# Patient Record
Sex: Female | Born: 1980 | Race: White | Hispanic: No | Marital: Married | Smoking: Former smoker
Health system: Southern US, Community
[De-identification: ages and names within clinical notes are randomized; demographics above are authoritative.]

## PROBLEM LIST (undated history)

## (undated) DIAGNOSIS — T7840XA Allergy, unspecified, initial encounter: Secondary | ICD-10-CM

## (undated) DIAGNOSIS — F988 Other specified behavioral and emotional disorders with onset usually occurring in childhood and adolescence: Secondary | ICD-10-CM

## (undated) DIAGNOSIS — D649 Anemia, unspecified: Secondary | ICD-10-CM

## (undated) DIAGNOSIS — F909 Attention-deficit hyperactivity disorder, unspecified type: Secondary | ICD-10-CM

## (undated) DIAGNOSIS — A084 Viral intestinal infection, unspecified: Secondary | ICD-10-CM

## (undated) DIAGNOSIS — K5792 Diverticulitis of intestine, part unspecified, without perforation or abscess without bleeding: Secondary | ICD-10-CM

## (undated) DIAGNOSIS — D279 Benign neoplasm of unspecified ovary: Secondary | ICD-10-CM

## (undated) DIAGNOSIS — R87629 Unspecified abnormal cytological findings in specimens from vagina: Secondary | ICD-10-CM

## (undated) DIAGNOSIS — IMO0001 Reserved for inherently not codable concepts without codable children: Secondary | ICD-10-CM

## (undated) HISTORY — DX: Unspecified abnormal cytological findings in specimens from vagina: R87.629

## (undated) HISTORY — DX: Hypocalcemia: E83.51

## (undated) HISTORY — DX: Viral intestinal infection, unspecified: A08.4

## (undated) HISTORY — DX: Attention-deficit hyperactivity disorder, unspecified type: F90.9

## (undated) HISTORY — DX: Reserved for inherently not codable concepts without codable children: IMO0001

## (undated) HISTORY — DX: Benign neoplasm of unspecified ovary: D27.9

## (undated) HISTORY — DX: Anemia, unspecified: D64.9

## (undated) HISTORY — DX: Allergy, unspecified, initial encounter: T78.40XA

## (undated) HISTORY — DX: Diverticulitis of intestine, part unspecified, without perforation or abscess without bleeding: K57.92

## (undated) HISTORY — DX: Other specified behavioral and emotional disorders with onset usually occurring in childhood and adolescence: F98.8

---
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
## 2001-09-14 ENCOUNTER — Inpatient Hospital Stay (HOSPITAL_COMMUNITY): Admission: AD | Admit: 2001-09-14 | Discharge: 2001-09-14 | Payer: Self-pay | Admitting: Obstetrics and Gynecology

## 2003-09-29 ENCOUNTER — Inpatient Hospital Stay (HOSPITAL_COMMUNITY): Admission: AD | Admit: 2003-09-29 | Discharge: 2003-09-29 | Payer: Self-pay | Admitting: Obstetrics and Gynecology

## 2003-10-08 ENCOUNTER — Other Ambulatory Visit: Admission: RE | Admit: 2003-10-08 | Discharge: 2003-10-08 | Payer: Self-pay | Admitting: Obstetrics and Gynecology

## 2004-04-17 ENCOUNTER — Inpatient Hospital Stay (HOSPITAL_COMMUNITY): Admission: AD | Admit: 2004-04-17 | Discharge: 2004-04-17 | Payer: Self-pay | Admitting: Obstetrics and Gynecology

## 2004-04-22 ENCOUNTER — Inpatient Hospital Stay (HOSPITAL_COMMUNITY): Admission: AD | Admit: 2004-04-22 | Discharge: 2004-04-23 | Payer: Self-pay | Admitting: Obstetrics and Gynecology

## 2009-03-27 ENCOUNTER — Inpatient Hospital Stay (HOSPITAL_COMMUNITY): Admission: AD | Admit: 2009-03-27 | Discharge: 2009-03-29 | Payer: Self-pay | Admitting: Obstetrics and Gynecology

## 2009-12-15 ENCOUNTER — Ambulatory Visit: Payer: Self-pay | Admitting: Obstetrics & Gynecology

## 2010-01-12 ENCOUNTER — Ambulatory Visit: Payer: Self-pay | Admitting: Obstetrics & Gynecology

## 2010-06-09 ENCOUNTER — Encounter: Payer: Self-pay | Admitting: Family Medicine

## 2010-07-07 ENCOUNTER — Ambulatory Visit: Payer: Self-pay | Admitting: Family Medicine

## 2010-07-07 DIAGNOSIS — F438 Other reactions to severe stress: Secondary | ICD-10-CM

## 2010-07-07 DIAGNOSIS — G4700 Insomnia, unspecified: Secondary | ICD-10-CM

## 2010-07-11 ENCOUNTER — Encounter: Payer: Self-pay | Admitting: Family Medicine

## 2010-07-12 LAB — CONVERTED CEMR LAB: TSH: 0.521 microintl units/mL (ref 0.350–4.500)

## 2010-08-04 ENCOUNTER — Ambulatory Visit: Payer: Self-pay | Admitting: Family Medicine

## 2010-09-20 NOTE — Letter (Signed)
Summary: Depression & Anxiety Questionnaire  Depression & Anxiety Questionnaire   Imported By: Lanelle Bal 07/19/2010 13:16:44  _____________________________________________________________________  External Attachment:    Type:   Image     Comment:   External Document

## 2010-09-20 NOTE — Letter (Signed)
Summary: Health Screening Results  Health Screening Results   Imported By: Maryln Gottron 07/25/2010 14:30:10  _____________________________________________________________________  External Attachment:    Type:   Image     Comment:   External Document

## 2010-09-20 NOTE — Assessment & Plan Note (Signed)
Summary: NOV: Anxiety   Vital Signs:  Patient profile:   30 year old female Weight:      126 pounds Pulse rate:   77 / minute BP sitting:   116 / 77  (right arm) Cuff size:   regular  Vitals Entered By: Avon Gully CMA, Duncan Dull) (July 07, 2010 2:18 PM) CC: anxiety,panic attacks, sleep issues   CC:  anxiety, panic attacks, and sleep issues.  History of Present Illness: Feeling ressly stressed. Not lseeping well. Eats well. More moody adn irritability. Never had this before.  More lately feel feels more nervous. Noitces when out at a gatheine feels like her heart starts racing and wants to be alone.  Still 5-6 hours at night. Has been worrying about her kids. going into court battle wiht her husband with whom she is separated.  Says will wake up around 3AM and can't go back to sleep for 1-2 hours.  She is most interested in a medication for as needed use.   Habits & Providers  Alcohol-Tobacco-Diet     Alcohol drinks/day: 0     Tobacco Status: current     Cigarette Packs/Day: 2.0  Exercise-Depression-Behavior     Does Patient Exercise: no     STD Risk: never     Drug Use: no  Current Medications (verified): 1)  Mirena 20 Mcg/24hr Iud (Levonorgestrel)  Allergies (verified): No Known Drug Allergies  Comments:  Nurse/Medical Assistant: The patient's medications and allergies were reviewed with the patient and were updated in the Medication and Allergy Lists. 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  Married to [employer] wiht 4 kids.   Current Smoker Drug use-no Regular exercise-no Smoking Status:  current Packs/Day:  2.0 STD Risk:  never Drug Use:  no Does Patient Exercise:  no  Physical Exam  General:  Well-developed,well-nourished,in no  acute distress; alert,appropriate and cooperative throughout examination Psych:  Cognition and judgment appear intact. Alert and cooperative with normal attention span and concentration. No apparent delusions, illusions, hallucinations   Impression & Recommendations:  Problem # 1:  OTHER ACUTE REACTIONS TO STRESS (ICD-308.3)  Discussed options.  She is not interested in therapy at this time.  GAD-7 14, PHQ-9 -11 Discussed that she does have anxziety and depression that is acute and situational. I really feel a daily medication would be helpful for her. Discussed optoins. Start with SSRI. Warned about different SE. F/U in 3 weeks. Can use the xanax as needed. Discussed the addictive potential of this medication. Will check her TSH to rule this out.   Orders: T-TSH ([id number])  Problem # 2:  INSOMNIA (ICD-780.52) I do think this is related to her anxiety.  I do nto feel this is a separate issues. Can use half a tab of xanax and see if helps he rsleep but I dont' want her to use it nightly.  Orders: T-TSH ([id number])  Complete Medication List: 1)  Mirena 20 Mcg/24hr Iud (Levonorgestrel) 2)  Citalopram Hydrobromide 20 Mg Tabs (Citalopram hydrobromide) .... 1/2 by mouth daily for one week, then increase to whole tab daily. 3)  Alprazolam 1 Mg Tabs (Alprazolam) .... Take 1 tablet by mouth once a day as needed anxiety  Patient Instructions: 1)  Please schedule a follow-up appointment in 3-4 weeks.  Prescriptions: ALPRAZOLAM 1 MG TABS (  ALPRAZOLAM) Take 1 tablet by mouth once a day as needed anxiety  #30 x 0   Entered and Authorized by:   Nani Gasser MD   Signed by:   Nani Gasser MD on 07/07/2010   Method used:   Printed then faxed to ...       [facility]* (retail)       [address]       [place], [place]  [address]       Ph: [phone]       Fax: [phone]   RxID:   [id number] CITALOPRAM HYDROBROMIDE 20 MG TABS (CITALOPRAM  HYDROBROMIDE) 1/2 by mouth daily for one week, then increase to whole tab daily.  #30 x 0   Entered and Authorized by:   Nani Gasser MD   Signed by:   Nani Gasser MD on 07/07/2010   Method used:   Electronically to        [facility] (retail)       [address]       [place], [place]  [address]       Ph: [phone]       Fax: [phone]   RxID:   [id number]    Orders Added: 1)  New Patient Level III [[address]] 2)  T-TSH [phone]

## 2010-09-22 NOTE — Assessment & Plan Note (Signed)
Summary: 4WK F/UP mood   Vital Signs:  Patient profile:   30 year old female Weight:      125 pounds Pulse rate:   95 / minute BP sitting:   106 / 64  (right arm) Cuff size:   regular  Vitals Entered By: Avon Gully CMA, Duncan Dull) (August 04, 2010 11:26 AM) CC: f/u anxiety   CC:  f/u anxiety.  History of Present Illness: Tolerating it well. No SE. Says she has noticed overall not tearful. Says coping with things a little better. Still feels stressed and anxious.  Says using the xanax mostly at bedtime to help her fall asleep.    Current Medications (verified): 1)  Mirena 20 Mcg/24hr Iud (Levonorgestrel) 2)  Citalopram Hydrobromide 20 Mg Tabs (Citalopram Hydrobromide) .... 1/2 By Mouth Daily For One Week, Then Increase To Whole Tab Daily. 3)  Alprazolam 1 Mg Tabs (Alprazolam) .... Take 1 Tablet By Mouth Once A Day As Needed Anxiety  Allergies (verified): No Known Drug Allergies  Comments:  Nurse/Medical Assistant: The patient's medications and allergies were reviewed with the patient and were updated in the Medication and Allergy Lists. Avon Gully CMA, Duncan Dull) (August 04, 2010 11:26 AM)  Physical Exam  General:  Well-developed,well-nourished,in no acute distress; alert,appropriate and cooperative throughout examination Lungs:  Normal respiratory effort, chest expands symmetrically. Lungs are clear to auscultation, no crackles or wheezes. Heart:  Normal rate and regular rhythm. S1 and S2 normal without gallop, murmur, click, rub or other extra sounds.   Impression & Recommendations:  Problem # 1:  OTHER ACUTE REACTIONS TO STRESS (ICD-308.3) GAD-7 socre of 11 today. This is improved frm 14 and she does feel her depressive sxs are much better.  F/U in 6 weeks.   Problem # 2:  INSOMNIA (ICD-780.52) Improved but using the xanax most nights. Monitor for dependency.   Complete Medication List: 1)  Mirena 20 Mcg/24hr Iud (Levonorgestrel) 2)  Citalopram  Hydrobromide 40 Mg Tabs (Citalopram hydrobromide) .... Take 1 tablet by mouth once a day 3)  Alprazolam 1 Mg Tabs (Alprazolam) .... Take 1 tablet by mouth once a day as needed anxiety  Patient Instructions: 1)  Please schedule a follow-up appointment in 6  for mood. Prescriptions: CITALOPRAM HYDROBROMIDE 40 MG TABS (CITALOPRAM HYDROBROMIDE) Take 1 tablet by mouth once a day  #30 x 1   Entered and Authorized by:   Nani Gasser MD   Signed by:   Nani Gasser MD on 08/04/2010   Method used:   Electronically to        [facility] (retail)       [address]       [place], [place]  [address]       Ph: [phone]       Fax: [phone]   RxID:   [id number]    Orders Added: 1)  Est. Patient Level III [[address]]

## 2010-09-22 NOTE — Letter (Signed)
Summary: Anxiety Questionnaire  Anxiety Questionnaire   Imported By: Lanelle Bal 08/25/2010 12:24:48  _____________________________________________________________________  External Attachment:    Type:   Image     Comment:   External Document

## 2010-10-19 ENCOUNTER — Telehealth: Payer: Self-pay | Admitting: Family Medicine

## 2010-10-27 NOTE — Progress Notes (Signed)
  Phone Note Refill Request Message from:  Patient on October 19, 2010 1:14 PM  Refills Requested: Medication #1:  CITALOPRAM HYDROBROMIDE 40 MG TABS Take 1 tablet by mouth once a day  Medication #2:  ALPRAZOLAM 1 MG TABS Take 1 tablet by mouth once a day as needed anxiety. Initial call taken by: Avon Gully CMA, Duncan Dull),  October 19, 2010 1:15 PM    Prescriptions: ALPRAZOLAM 1 MG TABS (ALPRAZOLAM) Take 1 tablet by mouth once a day as needed anxiety  #30 x 0   Entered by:   Avon Gully CMA, (AAMA)   Authorized by:   Nani Gasser MD   Signed by:   Avon Gully CMA, (AAMA) on 10/19/2010   Method used:   Printed then faxed to ...       [facility] (retail)       [address]       [place], [place]  [address]       Ph: [phone]       Fax: [phone]   RxID:   [id number] CITALOPRAM HYDROBROMIDE 40 MG TABS (CITALOPRAM HYDROBROMIDE) Take 1 tablet by mouth once a day  #30 x 1   Entered by:   Avon Gully CMA, (AAMA)   Authorized by:   Nani Gasser MD   Signed by:   Avon Gully CMA, (AAMA) on 10/19/2010   Method used:   Printed then faxed to ...       [facility] (retail)       [address]       [place], [place]  [address]       Ph: [phone]       Fax: [phone]   RxID:   [id number]

## 2010-11-18 ENCOUNTER — Other Ambulatory Visit: Payer: Self-pay | Admitting: Family Medicine

## 2010-11-18 DIAGNOSIS — F419 Anxiety disorder, unspecified: Secondary | ICD-10-CM

## 2010-11-18 MED ORDER — CITALOPRAM HYDROBROMIDE 40 MG PO TABS: 40.0000 mg | ORAL_TABLET | Freq: Every day | ORAL | Status: DC

## 2010-11-18 MED ORDER — ALPRAZOLAM 1 MG PO TABS: 1.0000 mg | ORAL_TABLET | Freq: Every evening | ORAL | Status: DC | PRN

## 2010-11-26 LAB — CBC
HCT: 29.5 % — ABNORMAL LOW (ref 36.0–46.0)
Hemoglobin: 10.1 g/dL — ABNORMAL LOW (ref 12.0–15.0)
Hemoglobin: 11.2 g/dL — ABNORMAL LOW (ref 12.0–15.0)
MCV: 96.9 fL (ref 78.0–100.0)
RBC: 3.04 MIL/uL — ABNORMAL LOW (ref 3.87–5.11)
RBC: 3.41 MIL/uL — ABNORMAL LOW (ref 3.87–5.11)
RDW: 14.4 % (ref 11.5–15.5)
WBC: 13 10*3/uL — ABNORMAL HIGH (ref 4.0–10.5)
WBC: 13.1 10*3/uL — ABNORMAL HIGH (ref 4.0–10.5)

## 2010-12-02 ENCOUNTER — Encounter: Payer: Self-pay | Admitting: Family Medicine

## 2010-12-02 ENCOUNTER — Ambulatory Visit (INDEPENDENT_AMBULATORY_CARE_PROVIDER_SITE_OTHER): Payer: 59 | Admitting: Family Medicine

## 2010-12-02 DIAGNOSIS — F438 Other reactions to severe stress: Secondary | ICD-10-CM

## 2010-12-02 DIAGNOSIS — R05 Cough: Secondary | ICD-10-CM

## 2010-12-02 MED ORDER — ALBUTEROL SULFATE HFA 108 (90 BASE) MCG/ACT IN AERS: 2.0000 | INHALATION_SPRAY | Freq: Four times a day (QID) | RESPIRATORY_TRACT | Status: DC | PRN

## 2010-12-02 MED ORDER — AZITHROMYCIN 250 MG PO TABS: ORAL_TABLET | ORAL | Status: AC

## 2010-12-02 NOTE — Assessment & Plan Note (Signed)
GAD-7 score of 8. Pt says she is very happy with her current regimen and is satisfied with her results and doesn't want to make any changes.

## 2010-12-02 NOTE — Progress Notes (Signed)
  Subjective:    Patient ID: Lauren Franklin, female    DOB: 15-May-1981, 30 y.o.   MRN: [id number]  HPI Cough for several weeks had a sinus infection. No sinus congestion or nasal pressure. Cough is occ dry and occ phelgm. SOB is with the coughing spell.  Seems to be worse at night.  Felt very SOB the other day. Used a friends rescue inhaler and that seemed to really help.  No hx of asthma. cough is waking her up in her sleep.  If smokes it actually gets better. Seh had recently stopped smoking.  If rans or gets hot will start coughing.  No fevers.  Some chest discomfort with cough.     Review of Systems     Objective:   Physical Exam  Constitutional: She appears well-developed and well-nourished.  HENT:  Head: Normocephalic and atraumatic.  Right Ear: External ear normal.  Left Ear: External ear normal.  Nose: Nose normal.  Mouth/Throat: Oropharynx is clear and moist.  Eyes: Conjunctivae and EOM are normal. Pupils are equal, round, and reactive to light.  Neck: No thyromegaly present.  Cardiovascular: Normal rate, regular rhythm and normal heart sounds.   Pulmonary/Chest: Effort normal and breath sounds normal.  Lymphadenopathy:    She has no cervical adenopathy.          Assessment & Plan:  Cough - She has had the cough for over a months and started with sinus sxs. Will go ahead and try a round of antibiotic. Will also call in an abuterol in haler since this really seemed to help, thought no dx of asthma. If not better in 10 days please call the office. Also recommend she start an antihistamine. Consider it may be allergic bronchitis as well. She says this has happened before in the spring.

## 2010-12-08 ENCOUNTER — Ambulatory Visit: Payer: Self-pay | Admitting: Family Medicine

## 2010-12-22 ENCOUNTER — Other Ambulatory Visit: Payer: Self-pay | Admitting: *Deleted

## 2010-12-22 DIAGNOSIS — F419 Anxiety disorder, unspecified: Secondary | ICD-10-CM

## 2010-12-22 MED ORDER — CITALOPRAM HYDROBROMIDE 40 MG PO TABS: 40.0000 mg | ORAL_TABLET | Freq: Every day | ORAL | Status: DC

## 2010-12-22 MED ORDER — ALPRAZOLAM 1 MG PO TABS: 1.0000 mg | ORAL_TABLET | Freq: Every evening | ORAL | Status: DC | PRN

## 2011-01-06 NOTE — H&P (Signed)
NAME:  Lauren Franklin, Lauren Franklin                         ACCOUNT NO.:  [id number]   MEDICAL RECORD NO.:  [id number]                   PATIENT TYPE:  INP   LOCATION:  9127                                 FACILITY:  [facility]   PHYSICIAN:  Hal Morales, M.D.             DATE OF BIRTH:  [birth year]   DATE OF ADMISSION:  04/22/2004  DATE OF DISCHARGE:                                HISTORY & PHYSICAL   HISTORY OF PRESENT ILLNESS:  Ms. Lauren Franklin is a 30 year old married white female,  gravida 3, para 1-0-1-1, who presents at 38-2/7 weeks complaining of leaking  large amounts of greenish-brownish-tinged fluid at 1 o'clock this morning  and the subsequent onset of uterine contractions.  She denies any nausea,  vomiting, headaches or visual disturbances.  She reports positive fetal  movement.  She would like some pain medication, but declines an epidural.  Her pregnancy has been followed at [facility] by the certified  nurse midwife service and has been essentially uncomplicated, though she is  positive for group B strep.   OBSTETRICAL/GYNECOLOGICAL HISTORY:  She is a gravida 3, para 1-0-1-1, who  delivered a viable female infant in January of 1999 who weighed 7 pounds 0  ounces at 39-6/7 weeks following a 4-hour labor.  She was attended in  delivery by Maggie Schwalbe, C.N.M. and that baby's name is Durene Cal.  In  February of 2003, she had a miscarriage, and then this current pregnancy.  She has no other GYN issues other than her LMP was July 29, 2003, giving  her an [facility] of May 04, 2004, confirmed by early ultrasound.   GENERAL MEDICAL HISTORY:  She reports having had the childhood diseases.  She has no medical issues other than a fractured right arm and collar bone,  and dislocated vertebra as a result of an accident.   ALLERGIES:  She has no known drug allergies.   PAST SURGICAL HISTORY:  Her only surgery was a laparotomy/cholecystectomy in  1999.   FAMILY HISTORY:  Her family  history is significant for maternal grandfather  with MI, maternal grandfather with chronic hypertension, maternal  grandmother with emphysema, maternal grandfather again with insulin-  dependent diabetes and CVA, maternal grandmother with stomach cancer.   GENETIC HISTORY:  Her genetic history is negative.   SOCIAL HISTORY:  She is married to [employer], who is involved and  supportive.  They are of the [facility] faith.  She is employed full-time as a  Geographical information systems officer and he is employed full-time in [employer], site.  They deny any  illicit drug use, alcohol or smoking with this pregnancy.   PRENATAL LABORATORIES:  Her blood type is A-positive, her antibody screen is  negative.  Syphilis is nonreactive.  Rubella is equivocal.  Hepatitis B  surface antigen is negative.  HIV is nonreactive.  Cystic fibrosis is  negative.  Her 36-week Beta strep was positive.   PHYSICAL EXAM:  VITAL SIGNS:  Her vital signs are stable.  She is afebrile.  HEENT:  Her HEENT is grossly within normal limits.  HEART:  Her heart is regular rhythm and rate.  CHEST:  Her chest is clear.  BREASTS:  Breasts are soft and nontender.  ABDOMEN:  Her abdomen is gravid with uterine contractions every 2 minutes.  Her fetal heart rate is reassuring.  PELVIC:  Her pelvic exam is 9 cm, 100%, vertex at a 0 station with moderate  meconium-stained fluid noted.  EXTREMITIES:  Her extremities are within normal limits.   ASSESSMENT:  1.  Intrauterine pregnancy at term.  2.  Active labor.  3.  Spontaneous rupture of the membranes with meconium-stained fluid.  4.  Positive group B streptococcus.   PLAN:  Her plan is to admit to [facility], to follow routine C.N.M.  orders, to give her ampicillin for group B strep and Dr. Hal Morales  is notified of the patient's admission.     Concha Pyo. Duplantis, C.N.M.              Hal Morales, M.D.    SJD/MEDQ  D:  04/22/2004  T:  04/22/2004  Job:  [id number]

## 2011-01-18 ENCOUNTER — Encounter: Payer: Self-pay | Admitting: Family Medicine

## 2011-01-18 ENCOUNTER — Ambulatory Visit (INDEPENDENT_AMBULATORY_CARE_PROVIDER_SITE_OTHER): Payer: 59 | Admitting: Family Medicine

## 2011-01-18 DIAGNOSIS — J069 Acute upper respiratory infection, unspecified: Secondary | ICD-10-CM

## 2011-01-18 MED ORDER — AZITHROMYCIN 250 MG PO TABS: ORAL_TABLET | ORAL | Status: AC

## 2011-01-18 NOTE — Patient Instructions (Signed)
Call if not getting better in the next 10 days.

## 2011-01-18 NOTE — Progress Notes (Signed)
  Subjective:    Patient ID: Lauren Franklin, female    DOB: 10-08-1980, 30 y.o.   MRN: [id number]  HPI Started with a blocked ear.  Some discomfort.  Daughter is sick. 2 days of nasal congestion, HA, sinus congestion.  She is going out of town this week.  No fever.  Cough is productive, green sputum.  No GI sxs.  Using nasal antihistamine and decongestant.    Review of Systems     Objective:   Physical Exam  Constitutional: She is oriented to person, place, and time. She appears well-developed and well-nourished.  HENT:  Head: Normocephalic and atraumatic.  Right Ear: External ear normal.  Left Ear: External ear normal.  Nose: Nose normal.  Mouth/Throat: Oropharynx is clear and moist.       TMs and canals are clear bilat.  Eyes: Conjunctivae are normal. Pupils are equal, round, and reactive to light.  Neck: Neck supple. No thyromegaly present.  Cardiovascular: Normal rate, regular rhythm and normal heart sounds.   Pulmonary/Chest: Effort normal and breath sounds normal.  Lymphadenopathy:    She has no cervical adenopathy.  Neurological: She is alert and oriented to person, place, and time.  Skin: Skin is warm and dry.  Psychiatric: She has a normal mood and affect.          Assessment & Plan:  URI- Since going out of town can fill the antibiotic if not getting better.  Call if not better in 10 days.

## 2011-01-20 ENCOUNTER — Other Ambulatory Visit: Payer: Self-pay | Admitting: *Deleted

## 2011-01-20 DIAGNOSIS — F419 Anxiety disorder, unspecified: Secondary | ICD-10-CM

## 2011-01-20 MED ORDER — ALPRAZOLAM 1 MG PO TABS: 1.0000 mg | ORAL_TABLET | Freq: Every evening | ORAL | Status: DC | PRN

## 2011-01-23 ENCOUNTER — Other Ambulatory Visit: Payer: Self-pay | Admitting: *Deleted

## 2011-01-23 MED ORDER — BENZONATATE 200 MG PO CAPS: 200.0000 mg | ORAL_CAPSULE | Freq: Three times a day (TID) | ORAL | Status: AC | PRN

## 2011-01-25 ENCOUNTER — Telehealth: Payer: Self-pay | Admitting: Family Medicine

## 2011-01-25 MED ORDER — HYDROCODONE-HOMATROPINE 5-1.5 MG/5ML PO SYRP: 5.0000 mL | ORAL_SOLUTION | Freq: Every evening | ORAL | Status: AC | PRN

## 2011-01-25 NOTE — Telephone Encounter (Signed)
Pt still with sever cough that is keeping her awake at night. Will call in cough med. Will send in nightime cough med. Has tessalone perles for daytime.

## 2011-02-27 ENCOUNTER — Other Ambulatory Visit: Payer: Self-pay | Admitting: Family Medicine

## 2011-02-27 DIAGNOSIS — F419 Anxiety disorder, unspecified: Secondary | ICD-10-CM

## 2011-02-27 MED ORDER — ALPRAZOLAM 1 MG PO TABS: 1.0000 mg | ORAL_TABLET | Freq: Every evening | ORAL | Status: DC | PRN

## 2011-03-30 ENCOUNTER — Other Ambulatory Visit: Payer: Self-pay | Admitting: *Deleted

## 2011-03-30 DIAGNOSIS — F419 Anxiety disorder, unspecified: Secondary | ICD-10-CM

## 2011-03-30 MED ORDER — CITALOPRAM HYDROBROMIDE 40 MG PO TABS: 40.0000 mg | ORAL_TABLET | Freq: Every day | ORAL | Status: DC

## 2011-03-30 MED ORDER — ALPRAZOLAM 1 MG PO TABS: 1.0000 mg | ORAL_TABLET | Freq: Every evening | ORAL | Status: DC | PRN

## 2011-04-21 ENCOUNTER — Other Ambulatory Visit: Payer: Self-pay | Admitting: *Deleted

## 2011-04-21 DIAGNOSIS — F419 Anxiety disorder, unspecified: Secondary | ICD-10-CM

## 2011-04-21 MED ORDER — CITALOPRAM HYDROBROMIDE 40 MG PO TABS: 40.0000 mg | ORAL_TABLET | Freq: Every day | ORAL | Status: DC

## 2011-04-21 MED ORDER — ALPRAZOLAM 1 MG PO TABS: 1.0000 mg | ORAL_TABLET | Freq: Every evening | ORAL | Status: DC | PRN

## 2011-05-29 ENCOUNTER — Other Ambulatory Visit: Payer: Self-pay | Admitting: *Deleted

## 2011-05-29 DIAGNOSIS — F419 Anxiety disorder, unspecified: Secondary | ICD-10-CM

## 2011-05-29 MED ORDER — ALPRAZOLAM 1 MG PO TABS: 1.0000 mg | ORAL_TABLET | Freq: Every evening | ORAL | Status: DC | PRN

## 2011-05-29 MED ORDER — CITALOPRAM HYDROBROMIDE 40 MG PO TABS: 40.0000 mg | ORAL_TABLET | Freq: Every day | ORAL | Status: DC

## 2011-06-23 ENCOUNTER — Other Ambulatory Visit: Payer: Self-pay | Admitting: *Deleted

## 2011-06-23 DIAGNOSIS — F419 Anxiety disorder, unspecified: Secondary | ICD-10-CM

## 2011-06-23 MED ORDER — ALPRAZOLAM 1 MG PO TABS: 1.0000 mg | ORAL_TABLET | Freq: Every evening | ORAL | Status: DC | PRN

## 2011-07-03 ENCOUNTER — Other Ambulatory Visit: Payer: Self-pay | Admitting: *Deleted

## 2011-07-03 MED ORDER — CITALOPRAM HYDROBROMIDE 40 MG PO TABS: 40.0000 mg | ORAL_TABLET | Freq: Every day | ORAL | Status: DC

## 2011-07-24 ENCOUNTER — Other Ambulatory Visit: Payer: Self-pay | Admitting: *Deleted

## 2011-07-24 DIAGNOSIS — F419 Anxiety disorder, unspecified: Secondary | ICD-10-CM

## 2011-07-24 MED ORDER — ALPRAZOLAM 1 MG PO TABS: 1.0000 mg | ORAL_TABLET | Freq: Every evening | ORAL | Status: DC | PRN

## 2011-08-04 ENCOUNTER — Other Ambulatory Visit: Payer: Self-pay | Admitting: *Deleted

## 2011-08-04 DIAGNOSIS — F419 Anxiety disorder, unspecified: Secondary | ICD-10-CM

## 2011-08-04 MED ORDER — ALPRAZOLAM 1 MG PO TABS: 1.0000 mg | ORAL_TABLET | Freq: Every evening | ORAL | Status: DC | PRN

## 2011-08-04 NOTE — Telephone Encounter (Signed)
Pharm did not get refill.called rx into pharm

## 2011-08-11 ENCOUNTER — Other Ambulatory Visit: Payer: Self-pay | Admitting: *Deleted

## 2011-08-11 MED ORDER — CITALOPRAM HYDROBROMIDE 40 MG PO TABS: 40.0000 mg | ORAL_TABLET | Freq: Every day | ORAL | Status: DC

## 2011-08-18 ENCOUNTER — Ambulatory Visit: Payer: 59 | Admitting: Physician Assistant

## 2011-08-24 ENCOUNTER — Ambulatory Visit: Payer: 59 | Admitting: Physician Assistant

## 2011-08-30 ENCOUNTER — Ambulatory Visit (INDEPENDENT_AMBULATORY_CARE_PROVIDER_SITE_OTHER): Payer: 59 | Admitting: Family Medicine

## 2011-08-30 VITALS — Temp 98.3°F | Ht 64.0 in | Wt 127.0 lb

## 2011-08-30 DIAGNOSIS — R300 Dysuria: Secondary | ICD-10-CM

## 2011-08-30 DIAGNOSIS — Z0289 Encounter for other administrative examinations: Secondary | ICD-10-CM

## 2011-08-30 DIAGNOSIS — N390 Urinary tract infection, site not specified: Secondary | ICD-10-CM

## 2011-08-30 LAB — POCT URINALYSIS DIPSTICK
Glucose, UA: NEGATIVE
Spec Grav, UA: 1.015
Urobilinogen, UA: 0.2

## 2011-08-30 MED ORDER — SULFAMETHOXAZOLE-TRIMETHOPRIM 800-160 MG PO TABS: 1.0000 | ORAL_TABLET | Freq: Two times a day (BID) | ORAL | Status: DC

## 2011-08-30 NOTE — Progress Notes (Signed)
Patient ID: Lauren Franklin, female   DOB: February 27, 1981, 30 y.o.   MRN: [id number] C/o cloudy, darker urine and burning w/ urination x 3 days. Worse at night.   UTI-her urinalysis is positive for leukocytes and blood. I will place her on Bactrim x3 days. Call if symptoms are not resolving or suddenly gets worse.

## 2011-08-30 NOTE — Progress Notes (Signed)
Addended by: Ellsworth Lennox on: 08/30/2011 03:10 PM   Modules accepted: Orders

## 2011-08-31 ENCOUNTER — Other Ambulatory Visit: Payer: Self-pay | Admitting: *Deleted

## 2011-08-31 MED ORDER — SULFAMETHOXAZOLE-TRIMETHOPRIM 800-160 MG PO TABS: 1.0000 | ORAL_TABLET | Freq: Two times a day (BID) | ORAL | Status: AC

## 2011-09-18 ENCOUNTER — Other Ambulatory Visit: Payer: Self-pay | Admitting: *Deleted

## 2011-09-18 DIAGNOSIS — F419 Anxiety disorder, unspecified: Secondary | ICD-10-CM

## 2011-09-18 MED ORDER — ALPRAZOLAM 1 MG PO TABS: 1.0000 mg | ORAL_TABLET | Freq: Every evening | ORAL | Status: DC | PRN

## 2011-09-18 MED ORDER — CITALOPRAM HYDROBROMIDE 40 MG PO TABS: 40.0000 mg | ORAL_TABLET | Freq: Every day | ORAL | Status: DC

## 2011-09-21 ENCOUNTER — Encounter: Payer: Self-pay | Admitting: *Deleted

## 2011-09-21 ENCOUNTER — Other Ambulatory Visit: Payer: Self-pay | Admitting: *Deleted

## 2011-09-21 DIAGNOSIS — F419 Anxiety disorder, unspecified: Secondary | ICD-10-CM

## 2011-09-21 MED ORDER — ALPRAZOLAM 1 MG PO TABS: 1.0000 mg | ORAL_TABLET | Freq: Every evening | ORAL | Status: DC | PRN

## 2011-10-11 ENCOUNTER — Ambulatory Visit (INDEPENDENT_AMBULATORY_CARE_PROVIDER_SITE_OTHER): Payer: 59 | Admitting: Family Medicine

## 2011-10-11 ENCOUNTER — Encounter: Payer: Self-pay | Admitting: Family Medicine

## 2011-10-11 VITALS — BP 129/77 | HR 90 | Wt 130.0 lb

## 2011-10-11 DIAGNOSIS — L708 Other acne: Secondary | ICD-10-CM

## 2011-10-11 DIAGNOSIS — L709 Acne, unspecified: Secondary | ICD-10-CM

## 2011-10-11 DIAGNOSIS — F988 Other specified behavioral and emotional disorders with onset usually occurring in childhood and adolescence: Secondary | ICD-10-CM

## 2011-10-11 DIAGNOSIS — N390 Urinary tract infection, site not specified: Secondary | ICD-10-CM

## 2011-10-11 LAB — POCT URINALYSIS DIPSTICK
Ketones, UA: NEGATIVE
Protein, UA: NEGATIVE
Spec Grav, UA: 1.015
Urobilinogen, UA: 0.2

## 2011-10-11 MED ORDER — BENZOYL PEROXIDE 5 % EX LIQD: Freq: Two times a day (BID) | CUTANEOUS | Status: AC

## 2011-10-11 MED ORDER — CLINDAMYCIN PHOSPHATE 1 % EX GEL: CUTANEOUS | Status: DC

## 2011-10-11 MED ORDER — AMPHETAMINE-DEXTROAMPHET ER 15 MG PO CP24: 15.0000 mg | ORAL_CAPSULE | ORAL | Status: DC

## 2011-10-11 MED ORDER — CIPROFLOXACIN HCL 500 MG PO TABS: 500.0000 mg | ORAL_TABLET | Freq: Two times a day (BID) | ORAL | Status: AC

## 2011-10-11 NOTE — Progress Notes (Signed)
  Subjective:    Patient ID: Lauren Franklin, female    DOB: [birth year], 30 y.o.   MRN: [id number]  HPI UTI -  Dysuria x 3.  + hematuria. No fever or back pain.  Tx with bactrim a few weeks ago for similar sxs. No douches.    Acne- Moslty on her face.  Worse on chin area.  Does pick as well.  Some cystic. No lesions on back or chest. Using neutrogenia wash.   ADD - Feels like can 't stay focused.  Says has always been that way. Feels has been worse lately.  She is single parent now.  She is easily side tracked. Sometimes cut people off in conversation.  She says she has to write everything down or she will forget to do it. She says she wonders if she could have done better in school if she been able to focus better. Will forget what she is doing.  Catches herself shaking her foot.  Can't sit still. Feels like she has to keep moving. Brother with ADHD. Was on meds since he was little. No hx of heart problems.  She'll have palpitations with her anxiety.    Review of Systems     Objective:   Physical Exam  Constitutional: She is oriented to person, place, and time. She appears well-developed and well-nourished.  HENT:  Head: Normocephalic and atraumatic.  Neurological: She is alert and oriented to person, place, and time.  Skin: Skin is warm and dry.       Fine pustular acne on the facial cheeks and chin.  More lesion on the chin with some scabs.    Psychiatric: She has a normal mood and affect. Her behavior is normal.          Assessment & Plan:  UTI - UA is +. Will tx with Cipro since recently on Bactrim. Encourage hydration and emptying bladder frequently.  H.O givne.   Acne - discussed avoiding touching and picking at the face. We will start with the benzoyl peroxide wash and clindamycin gel. Followup in 4-6 weeks. If not improving significantly consider a topical Retin-A cream.  ADD - Her depression is well controlled.  Adult ADHD-RS-IV score is 28.  Only 6 points for  hyperactivity questions.  Suspect predmonianly ADD. No hx of heart problems.  Only hx of palpitations with anxiety. Mood is well controlled right now. Discussed options of further testing vs trial of medication. Warned about potential SE of sitmulatnt. Monitor 4 decreased appetite, weight loss, insomnia, chest pain or shortness of breath or palpitations. Call immediately if she has any of these symptoms. Followup in one month. Adderral XR 25mg  daily.

## 2011-10-11 NOTE — Patient Instructions (Signed)

## 2011-10-20 ENCOUNTER — Ambulatory Visit: Payer: 59 | Admitting: Family Medicine

## 2011-10-24 ENCOUNTER — Ambulatory Visit: Payer: 59 | Admitting: Family Medicine

## 2011-10-30 ENCOUNTER — Other Ambulatory Visit: Payer: Self-pay | Admitting: *Deleted

## 2011-10-30 DIAGNOSIS — F419 Anxiety disorder, unspecified: Secondary | ICD-10-CM

## 2011-10-30 MED ORDER — CITALOPRAM HYDROBROMIDE 40 MG PO TABS: 40.0000 mg | ORAL_TABLET | Freq: Every day | ORAL | Status: DC

## 2011-10-30 MED ORDER — ALPRAZOLAM 1 MG PO TABS: 1.0000 mg | ORAL_TABLET | Freq: Every evening | ORAL | Status: DC | PRN

## 2011-11-03 ENCOUNTER — Other Ambulatory Visit: Payer: Self-pay | Admitting: *Deleted

## 2011-11-03 MED ORDER — AMPHETAMINE-DEXTROAMPHET ER 15 MG PO CP24: 15.0000 mg | ORAL_CAPSULE | ORAL | Status: DC

## 2011-11-29 ENCOUNTER — Other Ambulatory Visit: Payer: Self-pay | Admitting: *Deleted

## 2011-11-29 DIAGNOSIS — F419 Anxiety disorder, unspecified: Secondary | ICD-10-CM

## 2011-11-29 MED ORDER — ALPRAZOLAM 1 MG PO TABS: 1.0000 mg | ORAL_TABLET | Freq: Every evening | ORAL | Status: DC | PRN

## 2011-12-19 ENCOUNTER — Other Ambulatory Visit: Payer: Self-pay | Admitting: *Deleted

## 2011-12-19 MED ORDER — AMPHETAMINE-DEXTROAMPHET ER 15 MG PO CP24: 15.0000 mg | ORAL_CAPSULE | ORAL | Status: DC

## 2011-12-19 MED ORDER — CITALOPRAM HYDROBROMIDE 40 MG PO TABS: 40.0000 mg | ORAL_TABLET | Freq: Every day | ORAL | Status: DC

## 2011-12-28 ENCOUNTER — Other Ambulatory Visit: Payer: Self-pay | Admitting: *Deleted

## 2011-12-28 DIAGNOSIS — F419 Anxiety disorder, unspecified: Secondary | ICD-10-CM

## 2011-12-28 MED ORDER — ALPRAZOLAM 1 MG PO TABS: 1.0000 mg | ORAL_TABLET | Freq: Every evening | ORAL | Status: DC | PRN

## 2012-01-10 ENCOUNTER — Ambulatory Visit (INDEPENDENT_AMBULATORY_CARE_PROVIDER_SITE_OTHER): Payer: 59 | Admitting: Family Medicine

## 2012-01-10 ENCOUNTER — Encounter: Payer: Self-pay | Admitting: Family Medicine

## 2012-01-10 VITALS — BP 119/79 | HR 97 | Ht 64.0 in | Wt 122.0 lb

## 2012-01-10 DIAGNOSIS — F988 Other specified behavioral and emotional disorders with onset usually occurring in childhood and adolescence: Secondary | ICD-10-CM

## 2012-01-10 MED ORDER — AMPHETAMINE-DEXTROAMPHET ER 30 MG PO CP24: 30.0000 mg | ORAL_CAPSULE | ORAL | Status: DC

## 2012-01-10 NOTE — Progress Notes (Signed)
  Subjective:    Patient ID: Lauren Franklin, female    DOB: 1981-05-02, 31 y.o.   MRN: [id number]  HPI ADD - No CP or SOB.  HAs been taking 15mg  and taking 2 and feels it works better.  Says has been much more productive at work.  Skips weekend.  Stil sleeping well at night.  Not skipping meals.  She has been working on losing weight. She wasn't to get to 120.    Review of Systems     Objective:   Physical Exam  Constitutional: She is oriented to person, place, and time. She appears well-developed and well-nourished.  HENT:  Head: Normocephalic and atraumatic.  Cardiovascular: Normal rate, regular rhythm and normal heart sounds.   Pulmonary/Chest: Effort normal and breath sounds normal.  Neurological: She is alert and oriented to person, place, and time.  Skin: Skin is warm and dry.  Psychiatric: She has a normal mood and affect. Her behavior is normal.          Assessment & Plan:  ADD- doing well. Will inc dose to 30mg . F/U in 4 months. BP well controlled. Weight will need to be monitored.

## 2012-01-24 ENCOUNTER — Other Ambulatory Visit: Payer: Self-pay | Admitting: *Deleted

## 2012-01-24 DIAGNOSIS — F419 Anxiety disorder, unspecified: Secondary | ICD-10-CM

## 2012-01-24 MED ORDER — CITALOPRAM HYDROBROMIDE 40 MG PO TABS: 40.0000 mg | ORAL_TABLET | Freq: Every day | ORAL | Status: DC

## 2012-01-24 MED ORDER — ALPRAZOLAM 1 MG PO TABS: 1.0000 mg | ORAL_TABLET | Freq: Every evening | ORAL | Status: DC | PRN

## 2012-02-08 ENCOUNTER — Other Ambulatory Visit: Payer: Self-pay | Admitting: *Deleted

## 2012-02-08 MED ORDER — AMPHETAMINE-DEXTROAMPHET ER 30 MG PO CP24: 30.0000 mg | ORAL_CAPSULE | ORAL | Status: DC

## 2012-02-20 ENCOUNTER — Ambulatory Visit (INDEPENDENT_AMBULATORY_CARE_PROVIDER_SITE_OTHER): Payer: 59 | Admitting: Family Medicine

## 2012-02-20 DIAGNOSIS — R300 Dysuria: Secondary | ICD-10-CM

## 2012-02-20 DIAGNOSIS — N390 Urinary tract infection, site not specified: Secondary | ICD-10-CM

## 2012-02-20 DIAGNOSIS — R309 Painful micturition, unspecified: Secondary | ICD-10-CM

## 2012-02-20 LAB — POCT URINALYSIS DIPSTICK
Bilirubin, UA: NEGATIVE
Glucose, UA: NEGATIVE
Ketones, UA: NEGATIVE
Spec Grav, UA: 1.015
Urobilinogen, UA: 0.2

## 2012-02-20 MED ORDER — SULFAMETHOXAZOLE-TRIMETHOPRIM 800-160 MG PO TABS: 1.0000 | ORAL_TABLET | Freq: Two times a day (BID) | ORAL | Status: AC

## 2012-02-20 NOTE — Progress Notes (Signed)
  Subjective:    Patient ID: Lauren Franklin, female    DOB: 1981-01-26, 31 y.o.   MRN: [id number] Painful Urination x 1 month. This is her 4th UTI.  No fever or back pain.   HPI    Review of Systems     Objective:   Physical Exam        Assessment & Plan:  Dysuria-dip stick was positive for blood and leukocytes. We'll go ahead and treat for urinary tract infection. Bactrim sent to pharmacy. Call if not better in 3-5 days. Make sure drinking plenty of fluids.Will send culture if UTI is not clear.  Nani Gasser M.D.

## 2012-02-29 ENCOUNTER — Other Ambulatory Visit: Payer: Self-pay | Admitting: *Deleted

## 2012-02-29 DIAGNOSIS — F419 Anxiety disorder, unspecified: Secondary | ICD-10-CM

## 2012-02-29 MED ORDER — ALPRAZOLAM 1 MG PO TABS: 1.0000 mg | ORAL_TABLET | Freq: Every evening | ORAL | Status: DC | PRN

## 2012-03-04 ENCOUNTER — Other Ambulatory Visit: Payer: Self-pay | Admitting: *Deleted

## 2012-03-04 MED ORDER — CITALOPRAM HYDROBROMIDE 40 MG PO TABS: 40.0000 mg | ORAL_TABLET | Freq: Every day | ORAL | Status: DC

## 2012-03-08 ENCOUNTER — Other Ambulatory Visit: Payer: Self-pay | Admitting: *Deleted

## 2012-03-08 MED ORDER — AMPHETAMINE-DEXTROAMPHET ER 30 MG PO CP24: 30.0000 mg | ORAL_CAPSULE | ORAL | Status: DC

## 2012-04-05 ENCOUNTER — Other Ambulatory Visit: Payer: Self-pay | Admitting: *Deleted

## 2012-04-05 DIAGNOSIS — F419 Anxiety disorder, unspecified: Secondary | ICD-10-CM

## 2012-04-05 MED ORDER — ALPRAZOLAM 1 MG PO TABS: 1.0000 mg | ORAL_TABLET | Freq: Every evening | ORAL | Status: DC | PRN

## 2012-04-08 ENCOUNTER — Other Ambulatory Visit: Payer: Self-pay | Admitting: *Deleted

## 2012-04-08 MED ORDER — AMPHETAMINE-DEXTROAMPHET ER 30 MG PO CP24: 30.0000 mg | ORAL_CAPSULE | ORAL | Status: DC

## 2012-04-08 MED ORDER — CITALOPRAM HYDROBROMIDE 40 MG PO TABS: 40.0000 mg | ORAL_TABLET | Freq: Every day | ORAL | Status: DC

## 2012-04-17 ENCOUNTER — Other Ambulatory Visit: Payer: Self-pay | Admitting: Family Medicine

## 2012-04-17 DIAGNOSIS — Z1231 Encounter for screening mammogram for malignant neoplasm of breast: Secondary | ICD-10-CM

## 2012-05-06 ENCOUNTER — Other Ambulatory Visit: Payer: Self-pay | Admitting: *Deleted

## 2012-05-06 DIAGNOSIS — F419 Anxiety disorder, unspecified: Secondary | ICD-10-CM

## 2012-05-06 MED ORDER — ALPRAZOLAM 1 MG PO TABS: 1.0000 mg | ORAL_TABLET | Freq: Every evening | ORAL | Status: DC | PRN

## 2012-05-07 ENCOUNTER — Other Ambulatory Visit: Payer: Self-pay | Admitting: *Deleted

## 2012-05-07 MED ORDER — AMPHETAMINE-DEXTROAMPHET ER 30 MG PO CP24: 30.0000 mg | ORAL_CAPSULE | ORAL | Status: DC

## 2012-05-17 ENCOUNTER — Other Ambulatory Visit: Payer: Self-pay | Admitting: *Deleted

## 2012-05-17 MED ORDER — CITALOPRAM HYDROBROMIDE 40 MG PO TABS: 40.0000 mg | ORAL_TABLET | Freq: Every day | ORAL | Status: DC

## 2012-06-04 ENCOUNTER — Other Ambulatory Visit: Payer: Self-pay | Admitting: *Deleted

## 2012-06-04 DIAGNOSIS — F419 Anxiety disorder, unspecified: Secondary | ICD-10-CM

## 2012-06-04 MED ORDER — ALPRAZOLAM 1 MG PO TABS: 1.0000 mg | ORAL_TABLET | Freq: Every evening | ORAL | Status: DC | PRN

## 2012-06-05 ENCOUNTER — Other Ambulatory Visit: Payer: Self-pay | Admitting: *Deleted

## 2012-06-05 MED ORDER — AMPHETAMINE-DEXTROAMPHET ER 30 MG PO CP24: 30.0000 mg | ORAL_CAPSULE | ORAL | Status: DC

## 2012-06-11 ENCOUNTER — Ambulatory Visit (INDEPENDENT_AMBULATORY_CARE_PROVIDER_SITE_OTHER): Payer: 59 | Admitting: Family Medicine

## 2012-06-11 DIAGNOSIS — R300 Dysuria: Secondary | ICD-10-CM

## 2012-06-11 DIAGNOSIS — N390 Urinary tract infection, site not specified: Secondary | ICD-10-CM

## 2012-06-11 LAB — POCT URINALYSIS DIPSTICK
Protein, UA: 30
Spec Grav, UA: 1.02
pH, UA: 7

## 2012-06-11 MED ORDER — CIPROFLOXACIN HCL 500 MG PO TABS: 500.0000 mg | ORAL_TABLET | Freq: Two times a day (BID) | ORAL | Status: DC

## 2012-06-11 NOTE — Progress Notes (Signed)
  Subjective:    Patient ID: Lauren Franklin, female    DOB: 10-Jul-1981, 31 y.o.   MRN: [id number]  HPI  Dysuria x 2 days.  No recent ABx. No fever. No hematuria.   Review of Systems     Objective:   Physical Exam        Assessment & Plan:  UTI - Will tx w/ cipro.  Call if not better in 3 days.

## 2012-06-27 ENCOUNTER — Other Ambulatory Visit: Payer: Self-pay | Admitting: *Deleted

## 2012-06-27 MED ORDER — CITALOPRAM HYDROBROMIDE 40 MG PO TABS: 40.0000 mg | ORAL_TABLET | Freq: Every day | ORAL | Status: DC

## 2012-07-05 ENCOUNTER — Other Ambulatory Visit: Payer: Self-pay | Admitting: *Deleted

## 2012-07-05 DIAGNOSIS — F419 Anxiety disorder, unspecified: Secondary | ICD-10-CM

## 2012-07-05 MED ORDER — ALPRAZOLAM 1 MG PO TABS: 1.0000 mg | ORAL_TABLET | Freq: Every evening | ORAL | Status: DC | PRN

## 2012-07-05 MED ORDER — AMPHETAMINE-DEXTROAMPHET ER 30 MG PO CP24: 30.0000 mg | ORAL_CAPSULE | ORAL | Status: DC

## 2012-08-05 ENCOUNTER — Encounter: Payer: Self-pay | Admitting: Physician Assistant

## 2012-08-05 ENCOUNTER — Ambulatory Visit (INDEPENDENT_AMBULATORY_CARE_PROVIDER_SITE_OTHER): Payer: 59 | Admitting: Physician Assistant

## 2012-08-05 VITALS — BP 110/66 | HR 100 | Temp 97.7°F | Resp 18 | Wt 113.0 lb

## 2012-08-05 DIAGNOSIS — J020 Streptococcal pharyngitis: Secondary | ICD-10-CM

## 2012-08-05 DIAGNOSIS — J029 Acute pharyngitis, unspecified: Secondary | ICD-10-CM

## 2012-08-05 LAB — POCT RAPID STREP A (OFFICE): Rapid Strep A Screen: POSITIVE — AB

## 2012-08-05 MED ORDER — AMOXICILLIN-POT CLAVULANATE 875-125 MG PO TABS: 1.0000 | ORAL_TABLET | Freq: Two times a day (BID) | ORAL | Status: DC

## 2012-08-05 NOTE — Patient Instructions (Signed)

## 2012-08-05 NOTE — Progress Notes (Signed)
  Subjective:    Patient ID: Lauren Franklin, female    DOB: 1981/03/05, 31 y.o.   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 She represents with sore throat, fever and not being able to swallow. She started feeling bad 2 nights ago on Saturday night. Her fever started Sunday and has been as high as 102. She has not been able to swallow anything other than liquids. She has had no sick contacts that she knows of. She has tried to take Tylenol and Motrin for pain and has helped some. She denies any nausea, vomiting, and diarrhea. She has not had any upper respiratory symptoms such as ear pain or sinus pressure.    Review of Systems     Objective:   Physical Exam  Constitutional: She is oriented to person, place, and time. She appears well-developed and well-nourished.  HENT:  Head: Normocephalic and atraumatic.  Right Ear: External ear normal.  Left Ear: External ear normal.  Nose: Nose normal.       Oropharynx is very erythematous with tonsils enlarged bilaterally and white exudate.  Eyes: Conjunctivae normal are normal.  Neck: Normal range of motion. Neck supple.       Bilateral anterior cervical lymphadenopathy that is very tender to touch and enlarged.  Cardiovascular: Regular rhythm and normal heart sounds.        Tachycardia at 100.  Pulmonary/Chest: Effort normal and breath sounds normal. She has no wheezes.  Lymphadenopathy:    She has cervical adenopathy.  Neurological: She is alert and oriented to person, place, and time.  Skin: Skin is warm and dry.  Psychiatric: She has a normal mood and affect. Her behavior is normal.          Assessment & Plan:  Strep throat-rapid strep was positive. Patient was treated with Augmentin for 10 days. Patient was given handout on symptomatic care for strep throat. Patient was advised to stay at home for next 24-48 hours. She is able to return to work once fever has  diminished. Patient aware that strep throat is contagious and to not share beverages with anyone. She is to continue with Motrin for sore throat and fever. Patient encouraged to gargle salt water may help relieve symptoms also. Call if not on better in the next 24 hours.

## 2012-08-06 ENCOUNTER — Other Ambulatory Visit: Payer: Self-pay | Admitting: Physician Assistant

## 2012-08-06 MED ORDER — AZITHROMYCIN 500 MG PO TABS: 500.0000 mg | ORAL_TABLET | Freq: Every day | ORAL | Status: DC

## 2012-08-06 NOTE — Progress Notes (Signed)
Patient called and still having ST and difficulty swallowing. She has still had a low grade fever. Added azithromycin for atypical coverage and due to extremely swollen tonsils that I will cover for peritonsillar abscess.

## 2012-08-12 ENCOUNTER — Telehealth: Payer: Self-pay | Admitting: Family Medicine

## 2012-08-12 DIAGNOSIS — F419 Anxiety disorder, unspecified: Secondary | ICD-10-CM

## 2012-08-12 MED ORDER — ALPRAZOLAM 1 MG PO TABS: 1.0000 mg | ORAL_TABLET | Freq: Every evening | ORAL | Status: DC | PRN

## 2012-08-12 MED ORDER — AMPHETAMINE-DEXTROAMPHET ER 30 MG PO CP24: 30.0000 mg | ORAL_CAPSULE | ORAL | Status: DC

## 2012-08-12 NOTE — Telephone Encounter (Signed)
Pt needs a adderall & zanex refill called into pharmacy at [facility] today please

## 2012-08-22 ENCOUNTER — Ambulatory Visit: Payer: 59 | Admitting: Physician Assistant

## 2012-09-12 ENCOUNTER — Other Ambulatory Visit: Payer: Self-pay | Admitting: *Deleted

## 2012-09-12 DIAGNOSIS — F909 Attention-deficit hyperactivity disorder, unspecified type: Secondary | ICD-10-CM

## 2012-09-12 DIAGNOSIS — F419 Anxiety disorder, unspecified: Secondary | ICD-10-CM

## 2012-09-12 MED ORDER — AMPHETAMINE-DEXTROAMPHET ER 30 MG PO CP24: 30.0000 mg | ORAL_CAPSULE | ORAL | Status: DC

## 2012-09-12 MED ORDER — ALPRAZOLAM 1 MG PO TABS: 1.0000 mg | ORAL_TABLET | Freq: Every evening | ORAL | Status: DC | PRN

## 2012-10-14 ENCOUNTER — Other Ambulatory Visit: Payer: Self-pay

## 2012-10-14 MED ORDER — AMPHETAMINE-DEXTROAMPHET ER 30 MG PO CP24: 30.0000 mg | ORAL_CAPSULE | ORAL | Status: DC

## 2012-10-15 ENCOUNTER — Other Ambulatory Visit: Payer: Self-pay | Admitting: *Deleted

## 2012-10-15 MED ORDER — ALPRAZOLAM 1 MG PO TABS: 1.0000 mg | ORAL_TABLET | Freq: Every evening | ORAL | Status: DC | PRN

## 2012-10-24 ENCOUNTER — Other Ambulatory Visit: Payer: Self-pay | Admitting: *Deleted

## 2012-10-24 DIAGNOSIS — F909 Attention-deficit hyperactivity disorder, unspecified type: Secondary | ICD-10-CM

## 2012-10-24 MED ORDER — AMPHETAMINE-DEXTROAMPHET ER 30 MG PO CP24: 30.0000 mg | ORAL_CAPSULE | ORAL | Status: DC

## 2012-11-20 ENCOUNTER — Other Ambulatory Visit: Payer: Self-pay | Admitting: *Deleted

## 2012-11-20 DIAGNOSIS — F909 Attention-deficit hyperactivity disorder, unspecified type: Secondary | ICD-10-CM

## 2012-11-20 DIAGNOSIS — F419 Anxiety disorder, unspecified: Secondary | ICD-10-CM

## 2012-11-20 MED ORDER — ALPRAZOLAM 1 MG PO TABS: 1.0000 mg | ORAL_TABLET | Freq: Every evening | ORAL | Status: DC | PRN

## 2012-11-20 MED ORDER — AMPHETAMINE-DEXTROAMPHET ER 30 MG PO CP24: 30.0000 mg | ORAL_CAPSULE | ORAL | Status: DC

## 2012-12-19 ENCOUNTER — Ambulatory Visit (INDEPENDENT_AMBULATORY_CARE_PROVIDER_SITE_OTHER): Payer: 59 | Admitting: Family Medicine

## 2012-12-19 ENCOUNTER — Encounter: Payer: Self-pay | Admitting: Family Medicine

## 2012-12-19 VITALS — BP 103/69 | HR 98 | Temp 98.2°F | Wt 111.0 lb

## 2012-12-19 DIAGNOSIS — J029 Acute pharyngitis, unspecified: Secondary | ICD-10-CM

## 2012-12-19 DIAGNOSIS — J020 Streptococcal pharyngitis: Secondary | ICD-10-CM

## 2012-12-19 MED ORDER — KETOROLAC TROMETHAMINE 30 MG/ML IJ SOLN
30.0000 mg | Freq: Once | INTRAMUSCULAR | Status: AC
  Administered 2012-12-19: 30 mg via INTRAMUSCULAR

## 2012-12-19 MED ORDER — AMOXICILLIN-POT CLAVULANATE 875-125 MG PO TABS: 1.0000 | ORAL_TABLET | Freq: Two times a day (BID) | ORAL | Status: DC

## 2012-12-19 NOTE — Progress Notes (Signed)
CC: Lauren Franklin is a 32 y.o. female is here for Sore Throat and Fever   Subjective: HPI:  Patient complains sore throat and muscle aches.  This is proceeded by general feeling of unwellness yesterday. Last night came down with night sweats and subjective fever with temperature this morning of 100.5.  Symptoms overall getting worse daily basis. Moderate severity. Nothing particularly making better or worse right now, hurts to swallow not midneck. Accompanied by ear discomfort mild headache. Denies known tick exposure, photophobia, shortness of breath, neck pain, chest pain, rashes, specific joint pain, nor darkening of her urine    Review Of Systems Outlined In HPI  Past Medical History  Diagnosis Date  . IUD     mirena     Family History  Problem Relation Age of Onset  . Diabetes Other      History  Substance Use Topics  . Smoking status: Current Every Day Smoker  . Smokeless tobacco: Not on file  . Alcohol Use:      Objective: Filed Vitals:   12/19/12 0855  BP: 103/69  Pulse: 98  Temp: 98.2 F (36.8 C)    General: Alert and Oriented, No Acute Distress HEENT: Pupils equal, round, reactive to light. Conjunctivae clear.  External ears unremarkable, canals clear with intact TMs with appropriate landmarks.  Middle ear appears open without effusion. Pink inferior turbinates.  Moist mucous membranes, pharynx with moderate erythema and and a midline uvula without lesions.  Single 1 cm right submandibular lymphadenopathy. Lungs: Clear to auscultation bilaterally, no wheezing/ronchi/rales.  Comfortable work of breathing. Good air movement. Cardiac: Regular rate and rhythm. Normal S1/S2.  No murmurs, rubs, nor gallops.   Extremities: No peripheral edema.  Strong peripheral pulses.  Mental Status: No depression, anxiety, nor agitation. Skin: Warm and dry.  Assessment & Plan: [name] was seen today for sore throat and fever.  Diagnoses and associated orders for this  visit:  Acute pharyngitis - POCT rapid strep A  Strep pharyngitis - amoxicillin-clavulanate (AUGMENTIN) 875-125 MG per tablet; Take 1 tablet by mouth 2 (two) times daily.    Rapid strep positive, patient had favorable response to Augmentin back in December therefore repeat.  30 mg IM Toradol to help with severity of her pain and muscle aches.Signs and symptoms requring emergent/urgent reevaluation were discussed with the patient.  Return if symptoms worsen or fail to improve.

## 2012-12-23 ENCOUNTER — Other Ambulatory Visit: Payer: Self-pay | Admitting: *Deleted

## 2012-12-23 DIAGNOSIS — F419 Anxiety disorder, unspecified: Secondary | ICD-10-CM

## 2012-12-23 DIAGNOSIS — F909 Attention-deficit hyperactivity disorder, unspecified type: Secondary | ICD-10-CM

## 2012-12-23 MED ORDER — ALPRAZOLAM 1 MG PO TABS: 1.0000 mg | ORAL_TABLET | Freq: Every evening | ORAL | Status: DC | PRN

## 2012-12-23 MED ORDER — AMPHETAMINE-DEXTROAMPHET ER 30 MG PO CP24: 30.0000 mg | ORAL_CAPSULE | ORAL | Status: DC

## 2012-12-25 ENCOUNTER — Telehealth: Payer: Self-pay | Admitting: Family Medicine

## 2012-12-25 DIAGNOSIS — B373 Candidiasis of vulva and vagina: Secondary | ICD-10-CM

## 2012-12-25 MED ORDER — FLUCONAZOLE 150 MG PO TABS: ORAL_TABLET | ORAL | Status: AC

## 2012-12-25 NOTE — Telephone Encounter (Signed)
Patient reports vaginal irritation typical after starting antiboitics that resolves with diflucan.

## 2013-01-22 ENCOUNTER — Other Ambulatory Visit: Payer: Self-pay | Admitting: *Deleted

## 2013-01-22 DIAGNOSIS — F909 Attention-deficit hyperactivity disorder, unspecified type: Secondary | ICD-10-CM

## 2013-01-22 DIAGNOSIS — F419 Anxiety disorder, unspecified: Secondary | ICD-10-CM

## 2013-01-22 MED ORDER — ALPRAZOLAM 1 MG PO TABS: 1.0000 mg | ORAL_TABLET | Freq: Every evening | ORAL | Status: DC | PRN

## 2013-01-22 MED ORDER — AMPHETAMINE-DEXTROAMPHET ER 30 MG PO CP24: 30.0000 mg | ORAL_CAPSULE | ORAL | Status: DC

## 2013-01-28 ENCOUNTER — Ambulatory Visit (INDEPENDENT_AMBULATORY_CARE_PROVIDER_SITE_OTHER): Payer: 59 | Admitting: Family Medicine

## 2013-01-28 ENCOUNTER — Encounter: Payer: Self-pay | Admitting: Family Medicine

## 2013-01-28 VITALS — BP 113/79 | HR 112 | Wt 109.0 lb

## 2013-01-28 DIAGNOSIS — R634 Abnormal weight loss: Secondary | ICD-10-CM

## 2013-01-28 NOTE — Progress Notes (Signed)
  Subjective:    Patient ID: Lauren Franklin, female    DOB: 1980-09-11, 32 y.o.   MRN: [id number]  HPI F/U ADD medications. Has been a while since she followed up for her ADD medication. She feels that the Adderall XR 30 mg has worked the best for her than previous doses. She's happy with her current regimen except for some weight loss. She says she was intentionally trying to lose down to about 115 pounds but is now doing 209 pounds without really trying. She denies any chest pain or shortness of breath on the medication. She says she sleeping well with it. Mood has been well controlled.   Review of Systems     Objective:   Physical Exam  Constitutional: She is oriented to person, place, and time. She appears well-developed and well-nourished.  HENT:  Head: Normocephalic and atraumatic.  Cardiovascular: Normal rate, regular rhythm and normal heart sounds.   Pulmonary/Chest: Effort normal and breath sounds normal.  Neurological: She is alert and oriented to person, place, and time.  Skin: Skin is warm and dry.  Psychiatric: She has a normal mood and affect. Her behavior is normal.          Assessment & Plan:  ADD-I am concerned about her weight loss. She's very happy with her regimen and is very hesitant to decrease her dose. We discussed really focusing on her diet and try to get more healthy calories in. She already skips breakfast and has done that for years even before starting the medication. I strongly recommended that she eat breakfast before taking the Adderall. Also try to have some snacks throughout midmorning and midafternoon such as nuts or trail mix to add in a few extra calories. She could also consider trying the breakfast protein drinks. This can be helpful as well. I would like to see her back in one month to make sure that she is gaining some weight on the medication we may need to adjust her dose.  Abnormal weight loss-most likely from her Sinemet medication. But she  is slightly tachycardic today which could also be from the stimulant but we'll check her thyroid level as well.

## 2013-01-30 LAB — TSH: TSH: 0.792 u[IU]/mL (ref 0.350–4.500)

## 2013-01-30 NOTE — Progress Notes (Signed)
Quick Note:  All labs are normal. ______ 

## 2013-02-11 ENCOUNTER — Telehealth: Payer: Self-pay | Admitting: *Deleted

## 2013-02-11 ENCOUNTER — Other Ambulatory Visit: Payer: Self-pay | Admitting: Family Medicine

## 2013-02-11 MED ORDER — AMPHETAMINE-DEXTROAMPHETAMINE 20 MG PO TABS: 20.0000 mg | ORAL_TABLET | Freq: Two times a day (BID) | ORAL | Status: DC

## 2013-02-11 NOTE — Telephone Encounter (Signed)
Pt wants to know if you can give her the plain Adderall to try. States she has not been taking it and has not lost any weight but is eating and needs something to help her focus on her job.

## 2013-02-11 NOTE — Progress Notes (Signed)
Ok will change to regulra release adderal

## 2013-02-24 ENCOUNTER — Other Ambulatory Visit: Payer: Self-pay | Admitting: *Deleted

## 2013-02-24 DIAGNOSIS — F419 Anxiety disorder, unspecified: Secondary | ICD-10-CM

## 2013-02-24 MED ORDER — ALPRAZOLAM 1 MG PO TABS: 1.0000 mg | ORAL_TABLET | Freq: Every evening | ORAL | Status: DC | PRN

## 2013-03-12 ENCOUNTER — Other Ambulatory Visit: Payer: Self-pay | Admitting: *Deleted

## 2013-03-12 MED ORDER — AMPHETAMINE-DEXTROAMPHETAMINE 20 MG PO TABS: 20.0000 mg | ORAL_TABLET | Freq: Two times a day (BID) | ORAL | Status: DC

## 2013-03-26 ENCOUNTER — Other Ambulatory Visit: Payer: Self-pay | Admitting: *Deleted

## 2013-03-26 DIAGNOSIS — F419 Anxiety disorder, unspecified: Secondary | ICD-10-CM

## 2013-03-26 MED ORDER — ALPRAZOLAM 1 MG PO TABS: 1.0000 mg | ORAL_TABLET | Freq: Every evening | ORAL | Status: DC | PRN

## 2013-04-14 ENCOUNTER — Other Ambulatory Visit: Payer: Self-pay | Admitting: *Deleted

## 2013-04-14 MED ORDER — AMPHETAMINE-DEXTROAMPHETAMINE 20 MG PO TABS: 20.0000 mg | ORAL_TABLET | Freq: Two times a day (BID) | ORAL | Status: DC

## 2013-05-05 ENCOUNTER — Other Ambulatory Visit: Payer: Self-pay | Admitting: *Deleted

## 2013-05-05 DIAGNOSIS — F419 Anxiety disorder, unspecified: Secondary | ICD-10-CM

## 2013-05-05 MED ORDER — ALPRAZOLAM 1 MG PO TABS: 1.0000 mg | ORAL_TABLET | Freq: Every evening | ORAL | Status: DC | PRN

## 2013-05-14 ENCOUNTER — Other Ambulatory Visit: Payer: Self-pay | Admitting: *Deleted

## 2013-05-14 MED ORDER — AMPHETAMINE-DEXTROAMPHETAMINE 20 MG PO TABS: 20.0000 mg | ORAL_TABLET | Freq: Two times a day (BID) | ORAL | Status: DC

## 2013-06-03 ENCOUNTER — Other Ambulatory Visit: Payer: Self-pay | Admitting: *Deleted

## 2013-06-03 DIAGNOSIS — F419 Anxiety disorder, unspecified: Secondary | ICD-10-CM

## 2013-06-03 MED ORDER — ALPRAZOLAM 1 MG PO TABS: 1.0000 mg | ORAL_TABLET | Freq: Every evening | ORAL | Status: DC | PRN

## 2013-06-09 ENCOUNTER — Other Ambulatory Visit: Payer: Self-pay | Admitting: Physician Assistant

## 2013-06-09 MED ORDER — FLUCONAZOLE 150 MG PO TABS: 150.0000 mg | ORAL_TABLET | Freq: Once | ORAL | Status: DC

## 2013-06-09 NOTE — Progress Notes (Signed)
Pt calls in with vaginal discharge and itching. Hx of yeast infection. Nothing tried.

## 2013-06-11 ENCOUNTER — Other Ambulatory Visit: Payer: Self-pay | Admitting: *Deleted

## 2013-06-11 MED ORDER — AMPHETAMINE-DEXTROAMPHETAMINE 20 MG PO TABS: 20.0000 mg | ORAL_TABLET | Freq: Two times a day (BID) | ORAL | Status: DC

## 2013-07-04 ENCOUNTER — Other Ambulatory Visit: Payer: Self-pay | Admitting: *Deleted

## 2013-07-04 DIAGNOSIS — F419 Anxiety disorder, unspecified: Secondary | ICD-10-CM

## 2013-07-04 MED ORDER — ALPRAZOLAM 1 MG PO TABS: 1.0000 mg | ORAL_TABLET | Freq: Every evening | ORAL | Status: DC | PRN

## 2013-07-10 ENCOUNTER — Other Ambulatory Visit: Payer: Self-pay | Admitting: *Deleted

## 2013-07-10 MED ORDER — AMPHETAMINE-DEXTROAMPHETAMINE 20 MG PO TABS: 20.0000 mg | ORAL_TABLET | Freq: Two times a day (BID) | ORAL | Status: DC

## 2013-07-16 ENCOUNTER — Encounter: Payer: Self-pay | Admitting: Family Medicine

## 2013-07-16 ENCOUNTER — Ambulatory Visit (INDEPENDENT_AMBULATORY_CARE_PROVIDER_SITE_OTHER): Payer: 59 | Admitting: Family Medicine

## 2013-07-16 ENCOUNTER — Other Ambulatory Visit: Payer: Self-pay | Admitting: Family Medicine

## 2013-07-16 VITALS — BP 103/56 | HR 87 | Temp 97.5°F | Ht 63.0 in | Wt 113.0 lb

## 2013-07-16 DIAGNOSIS — Z0000 Encounter for general adult medical examination without abnormal findings: Secondary | ICD-10-CM

## 2013-07-16 DIAGNOSIS — Z23 Encounter for immunization: Secondary | ICD-10-CM

## 2013-07-16 NOTE — Progress Notes (Signed)
  Subjective:     Lauren Franklin is a 32 y.o. female and is here for a comprehensive physical exam. The patient reports no problems.  History   Social History  . Marital Status: Married    Spouse Name: Ace Gins    Number of Children: N/A  . Years of Education: N/A   Occupational History  . CLINIC SCHEDULER    Social History Main Topics  . Smoking status: Current Every Day Smoker -- 0.25 packs/day    Types: Cigarettes  . Smokeless tobacco: Not on file     Comment: 3 cig per day. Doesnt smoke at home.   . Alcohol Use: Not on file  . Drug Use: No  . Sexual Activity: Not on file   Other Topics Concern  . Not on file   Social History Narrative   No regular execise.    Health Maintenance  Topic Date Due  . Tetanus/tdap  08/21/2009  . Pap Smear  08/21/2012  . Influenza Vaccine  03/21/2014    The following portions of the patient's history were reviewed and updated as appropriate: allergies, current medications, past family history, past medical history, past social history, past surgical history and problem list.  Review of Systems A comprehensive review of systems was negative.   Objective:    BP 103/56  Pulse 87  Temp(Src) 97.5 F (36.4 C) (Oral)  Ht 5\' 3"  (1.6 m)  Wt 113 lb (51.256 kg)  BMI 20.02 kg/m2 General appearance: alert, cooperative and appears stated age Head: Normocephalic, without obvious abnormality, atraumatic Eyes: conj clera, EOMI, PEERLA Ears: normal TM's and external ear canals both ears Nose: Nares normal. Septum midline. Mucosa normal. No drainage or sinus tenderness. Throat: lips, mucosa, and tongue normal; teeth and gums normal Neck: no adenopathy, no carotid bruit, no JVD, supple, symmetrical, trachea midline and thyroid not enlarged, symmetric, no tenderness/mass/nodules Back: symmetric, no curvature. ROM normal. No CVA tenderness. Lungs: clear to auscultation bilaterally Heart: regular rate and rhythm, S1, S2 normal, no murmur, click, rub  or gallop Abdomen: soft, non-tender; bowel sounds normal; no masses,  no organomegaly Extremities: extremities normal, atraumatic, no cyanosis or edema Pulses: 2+ and symmetric Skin: Skin color, texture, turgor normal. No rashes or lesions Lymph nodes: Cervical adenopathy: nl and Supraclavicular adenopathy: nl Neurologic: Alert and oriented X 3, normal strength and tone. Normal symmetric reflexes. Normal coordination and gait    Assessment:    Healthy female exam.      Plan:     See After Visit Summary for Counseling Recommendations  Keep up a regular exercise program and make sure you are eating a healthy diet Try to eat 4 servings of dairy a day, or if you are lactose intolerant take a calcium with vitamin D daily.  Your vaccines are up to date.  Check CMP and lipids Her last Pap smear was approximately 3 years ago. Encouraged her to schedule her Pap smear seen with her OB/GYN here in our building. Tdap given today.

## 2013-07-16 NOTE — Patient Instructions (Signed)
Keep up a regular exercise program and make sure you are eating a healthy diet Try to eat 4 servings of dairy a day, or if you are lactose intolerant take a calcium with vitamin D daily.  Your vaccines are up to date.   

## 2013-07-16 NOTE — Addendum Note (Signed)
Addended by: Deno Etienne on: 07/16/2013 02:53 PM   Modules accepted: Orders, SmartSet

## 2013-07-17 LAB — COMPLETE METABOLIC PANEL WITH GFR
ALT: 12 U/L (ref 0–35)
AST: 17 U/L (ref 0–37)
Albumin: 4.8 g/dL (ref 3.5–5.2)
BUN: 12 mg/dL (ref 6–23)
Calcium: 9.5 mg/dL (ref 8.4–10.5)
Chloride: 104 mEq/L (ref 96–112)
Potassium: 4.2 mEq/L (ref 3.5–5.3)
Sodium: 140 mEq/L (ref 135–145)
Total Protein: 7.5 g/dL (ref 6.0–8.3)

## 2013-07-17 LAB — LIPID PANEL
Cholesterol: 133 mg/dL (ref 0–200)
LDL Cholesterol: 70 mg/dL (ref 0–99)
Total CHOL/HDL Ratio: 2.5 Ratio
Triglycerides: 46 mg/dL (ref <150)
VLDL: 9 mg/dL (ref 0–40)

## 2013-07-17 LAB — TSH: TSH: 0.834 u[IU]/mL (ref 0.350–4.500)

## 2013-07-19 NOTE — Progress Notes (Signed)
Quick Note:  All labs are normal. ______ 

## 2013-07-24 ENCOUNTER — Encounter: Payer: 59 | Admitting: Family Medicine

## 2013-08-07 ENCOUNTER — Other Ambulatory Visit: Payer: Self-pay | Admitting: *Deleted

## 2013-08-07 DIAGNOSIS — F419 Anxiety disorder, unspecified: Secondary | ICD-10-CM

## 2013-08-07 MED ORDER — ALPRAZOLAM 1 MG PO TABS: 1.0000 mg | ORAL_TABLET | Freq: Every evening | ORAL | Status: DC | PRN

## 2013-08-07 MED ORDER — AMPHETAMINE-DEXTROAMPHETAMINE 20 MG PO TABS: 20.0000 mg | ORAL_TABLET | Freq: Two times a day (BID) | ORAL | Status: DC

## 2013-08-11 ENCOUNTER — Ambulatory Visit (INDEPENDENT_AMBULATORY_CARE_PROVIDER_SITE_OTHER): Payer: 59 | Admitting: Sports Medicine

## 2013-08-11 ENCOUNTER — Encounter: Payer: Self-pay | Admitting: Sports Medicine

## 2013-08-11 VITALS — BP 116/80 | HR 111 | Wt 114.0 lb

## 2013-08-11 DIAGNOSIS — L02412 Cutaneous abscess of left axilla: Secondary | ICD-10-CM | POA: Insufficient documentation

## 2013-08-11 DIAGNOSIS — IMO0002 Reserved for concepts with insufficient information to code with codable children: Secondary | ICD-10-CM

## 2013-08-11 MED ORDER — DOXYCYCLINE HYCLATE 100 MG PO TABS: 100.0000 mg | ORAL_TABLET | Freq: Two times a day (BID) | ORAL | Status: DC

## 2013-08-11 MED ORDER — HYDROCODONE-ACETAMINOPHEN 5-325 MG PO TABS: 0.5000 | ORAL_TABLET | Freq: Three times a day (TID) | ORAL | Status: DC | PRN

## 2013-08-11 NOTE — Assessment & Plan Note (Signed)
Complex incision and drainage as above. Packing placed. Doxycycline, hydrocodone. Remove approximately 4 inches of packing tomorrow.

## 2013-08-11 NOTE — Progress Notes (Signed)
  Subjective:    CC: Boil  HPI: Lauren Franklin is a very pleasant 32 year old female, she works in our front office, for the past several days she's had increasing pain and swelling that she localizes in the left axilla without radiation. Unfortunately he continues to increase in size and pain. Pain is moderate, persistent. No fevers, chills or other constitutional symptoms.  Past medical history, Surgical history, Family history not pertinant except as noted below, Social history, Allergies, and medications have been entered into the medical record, reviewed, and no changes needed.   Review of Systems: No fevers, chills, night sweats, weight loss, chest pain, or shortness of breath.   Objective:    General: Well Developed, well nourished, and in no acute distress.  Neuro: Alert and oriented x3, extra-ocular muscles intact, sensation grossly intact.  HEENT: Normocephalic, atraumatic, pupils equal round reactive to light, neck supple, no masses, no lymphadenopathy, thyroid nonpalpable.  Skin: Warm and dry, no rashes. There is a 2 cm abscess in the left actually. Cardiac: Regular rate and rhythm, no murmurs rubs or gallops, no lower extremity edema.  Respiratory: Clear to auscultation bilaterally. Not using accessory muscles, speaking in full sentences.  Procedure:  Complicated incision and drainage of abscess. Risks, benefits, and alternatives explained and consent obtained. Time out conducted. Surface cleaned with alcohol. 10cc lidocaine with epinephine infiltrated around abscess. Adequate anesthesia ensured. Area prepped and draped in a sterile fashion. #11 blade used to make a stab incision into abscess. Pus expressed with pressure. Curved hemostat used to explore 4 quadrants and loculations broken up. Further purulence expressed. Iodoform packing placed leaving a 1-inch tail. Hemostasis achieved. Pt stable. Aftercare and follow-up advised.  Impression and Recommendations:

## 2013-08-12 ENCOUNTER — Ambulatory Visit: Payer: 59

## 2013-08-12 ENCOUNTER — Ambulatory Visit (INDEPENDENT_AMBULATORY_CARE_PROVIDER_SITE_OTHER): Payer: 59 | Admitting: Sports Medicine

## 2013-08-12 DIAGNOSIS — L02412 Cutaneous abscess of left axilla: Secondary | ICD-10-CM

## 2013-08-12 DIAGNOSIS — IMO0002 Reserved for concepts with insufficient information to code with codable children: Secondary | ICD-10-CM

## 2013-08-12 NOTE — Progress Notes (Signed)
  Subjective: I performed a complex incision and drainage of an abscess in the axilla yesterday. She is taking doxycycline, but is having a significant amount of pain post procedurally. No fevers, chills, or other constitutional symptoms. She was given a Toradol shot earlier today and this is improved her symptoms significantly.   Objective: General: Well-developed, well-nourished, and in no acute distress. Incision is clean, dry, intact, I removed about 6 inches of packing material today, was redressed. There is improvement in swelling, and erythema.  Assessment/plan:

## 2013-08-12 NOTE — Assessment & Plan Note (Signed)
Doing very well, we will touch base over Christmas regarding further removal of the packing material. She should continue her doxycycline and hydrocodone as needed.

## 2013-08-18 ENCOUNTER — Other Ambulatory Visit: Payer: Self-pay | Admitting: Sports Medicine

## 2013-08-18 MED ORDER — SULFAMETHOXAZOLE-TRIMETHOPRIM 800-160 MG PO TABS: 1.0000 | ORAL_TABLET | Freq: Two times a day (BID) | ORAL | Status: DC

## 2013-08-18 NOTE — Telephone Encounter (Signed)
Mild persistence of erythema with only a little fluctuance. Adding a course of Septra.

## 2013-08-19 ENCOUNTER — Other Ambulatory Visit: Payer: Self-pay | Admitting: Sports Medicine

## 2013-08-19 MED ORDER — FLUCONAZOLE 150 MG PO TABS: 150.0000 mg | ORAL_TABLET | Freq: Once | ORAL | Status: DC

## 2013-09-05 ENCOUNTER — Other Ambulatory Visit: Payer: Self-pay | Admitting: *Deleted

## 2013-09-05 DIAGNOSIS — F419 Anxiety disorder, unspecified: Secondary | ICD-10-CM

## 2013-09-05 MED ORDER — ALPRAZOLAM 1 MG PO TABS: 1.0000 mg | ORAL_TABLET | Freq: Every evening | ORAL | Status: DC | PRN

## 2013-09-05 MED ORDER — AMPHETAMINE-DEXTROAMPHETAMINE 20 MG PO TABS: 20.0000 mg | ORAL_TABLET | Freq: Two times a day (BID) | ORAL | Status: DC

## 2013-10-06 ENCOUNTER — Other Ambulatory Visit: Payer: Self-pay | Admitting: *Deleted

## 2013-10-06 ENCOUNTER — Telehealth: Payer: Self-pay | Admitting: *Deleted

## 2013-10-06 DIAGNOSIS — F419 Anxiety disorder, unspecified: Secondary | ICD-10-CM

## 2013-10-06 MED ORDER — AMPHETAMINE-DEXTROAMPHETAMINE 20 MG PO TABS: 20.0000 mg | ORAL_TABLET | Freq: Two times a day (BID) | ORAL | Status: DC

## 2013-10-06 MED ORDER — ALPRAZOLAM 1 MG PO TABS
1.0000 mg | ORAL_TABLET | Freq: Every evening | ORAL | Status: DC | PRN
Start: 2013-10-06 — End: 2013-11-03

## 2013-10-22 ENCOUNTER — Telehealth: Payer: Self-pay | Admitting: *Deleted

## 2013-10-22 MED ORDER — ADAPALENE-BENZOYL PEROXIDE 0.1-2.5 % EX GEL: 1.0000 "application " | Freq: Every day | CUTANEOUS | Status: DC

## 2013-10-22 NOTE — Telephone Encounter (Signed)
Pt informed.  Ronna Herskowitz, LPN  

## 2013-10-22 NOTE — Telephone Encounter (Signed)
Gave pt samples of Epiduo to try for facial breakout and pt states it helped and would like to know if we can send an RX to [place].  Oscar La, LPN

## 2013-10-22 NOTE — Telephone Encounter (Signed)
Med sent to pharmacy.

## 2013-11-03 ENCOUNTER — Other Ambulatory Visit: Payer: Self-pay | Admitting: *Deleted

## 2013-11-03 DIAGNOSIS — F419 Anxiety disorder, unspecified: Secondary | ICD-10-CM

## 2013-11-03 MED ORDER — ALPRAZOLAM 1 MG PO TABS: 1.0000 mg | ORAL_TABLET | Freq: Every evening | ORAL | Status: DC | PRN

## 2013-11-03 MED ORDER — AMPHETAMINE-DEXTROAMPHETAMINE 20 MG PO TABS: 20.0000 mg | ORAL_TABLET | Freq: Two times a day (BID) | ORAL | Status: DC

## 2013-12-02 ENCOUNTER — Other Ambulatory Visit: Payer: Self-pay | Admitting: *Deleted

## 2013-12-02 DIAGNOSIS — F419 Anxiety disorder, unspecified: Secondary | ICD-10-CM

## 2013-12-02 MED ORDER — AMPHETAMINE-DEXTROAMPHETAMINE 20 MG PO TABS: 20.0000 mg | ORAL_TABLET | Freq: Two times a day (BID) | ORAL | Status: DC

## 2013-12-02 MED ORDER — ALPRAZOLAM 1 MG PO TABS: 1.0000 mg | ORAL_TABLET | Freq: Every evening | ORAL | Status: DC | PRN

## 2013-12-15 ENCOUNTER — Encounter: Payer: Self-pay | Admitting: Family Medicine

## 2013-12-15 ENCOUNTER — Ambulatory Visit (INDEPENDENT_AMBULATORY_CARE_PROVIDER_SITE_OTHER): Payer: 59 | Admitting: Family Medicine

## 2013-12-15 VITALS — BP 116/76 | HR 125 | Temp 99.1°F | Wt 115.0 lb

## 2013-12-15 DIAGNOSIS — J069 Acute upper respiratory infection, unspecified: Secondary | ICD-10-CM

## 2013-12-15 NOTE — Progress Notes (Signed)
   Subjective:    Patient ID: Lauren Franklin, female    DOB: 1980/11/20, 33 y.o.   MRN: [id number]  HPI 3 days of nasal congestion and ear pain.  Voice is raspy. Mucous is yellow.  Low grade temp. + sick contacts.  Ears are achey when she blows her nose. Only using IBU.     Review of Systems     Objective:   Physical Exam  Constitutional: She is oriented to person, place, and time. She appears well-developed and well-nourished.  HENT:  Head: Normocephalic and atraumatic.  Right Ear: External ear normal.  Left Ear: External ear normal.  Nose: Nose normal.  Mouth/Throat: Oropharynx is clear and moist.  TMs and canals are clear.   Eyes: Conjunctivae and EOM are normal. Pupils are equal, round, and reactive to light.  Neck: Neck supple. No thyromegaly present.  Cardiovascular: Normal rate, regular rhythm and normal heart sounds.   Pulmonary/Chest: Effort normal and breath sounds normal. She has no wheezes.  Lymphadenopathy:    She has no cervical adenopathy.  Neurological: She is alert and oriented to person, place, and time.  Skin: Skin is warm and dry.  Psychiatric: She has a normal mood and affect.          Assessment & Plan:  URI - likely viral. Continue symptomatic care. Call if not improving by Friday of this week.

## 2013-12-31 ENCOUNTER — Other Ambulatory Visit: Payer: Self-pay | Admitting: *Deleted

## 2013-12-31 DIAGNOSIS — F419 Anxiety disorder, unspecified: Secondary | ICD-10-CM

## 2013-12-31 MED ORDER — ALPRAZOLAM 1 MG PO TABS: 1.0000 mg | ORAL_TABLET | Freq: Every evening | ORAL | Status: DC | PRN

## 2013-12-31 MED ORDER — AMPHETAMINE-DEXTROAMPHETAMINE 20 MG PO TABS: 20.0000 mg | ORAL_TABLET | Freq: Two times a day (BID) | ORAL | Status: DC

## 2014-01-06 ENCOUNTER — Other Ambulatory Visit: Payer: Self-pay | Admitting: Family Medicine

## 2014-01-06 MED ORDER — AMOXICILLIN-POT CLAVULANATE 875-125 MG PO TABS: 1.0000 | ORAL_TABLET | Freq: Two times a day (BID) | ORAL | Status: DC

## 2014-01-06 MED ORDER — CLINDAMYCIN PHOSPHATE 1 % EX GEL: CUTANEOUS | Status: DC

## 2014-01-06 NOTE — Progress Notes (Signed)
Patient came by. She has an abscess in the right axilla. Had a similar one month on going to the left. She really wants to avoid I&D if possible. We'll place her on Augmentin and have her do warm compresses over the next day or 2. If it's not resolving, or if not rupture on its own, or if getting larger or more painful than needs to have it lanced. Patient will come by to have me look at it again tomorrow. She also needs a refill on the clindamycin gel. Avoid using scrubbing loufas etc. Can use an antibacterial soap as well.

## 2014-01-07 ENCOUNTER — Encounter: Payer: Self-pay | Admitting: Family Medicine

## 2014-01-07 ENCOUNTER — Ambulatory Visit (INDEPENDENT_AMBULATORY_CARE_PROVIDER_SITE_OTHER): Payer: 59 | Admitting: Family Medicine

## 2014-01-07 VITALS — BP 116/75 | HR 86 | Wt 115.0 lb

## 2014-01-07 DIAGNOSIS — IMO0002 Reserved for concepts with insufficient information to code with codable children: Secondary | ICD-10-CM

## 2014-01-07 NOTE — Progress Notes (Signed)
   Subjective:    Patient ID: Lauren Franklin, female    DOB: 1981/02/14, 33 y.o.   MRN: [id number]  HPI Abscess- Has had an abcess under the right axilla for several days. Start augmentin yesterday. Han't broken open. No fever, chills or sweats.  It is very tender and painful and has been keeping her awake at night. She did take some ibuprofen today for pain relief.   Review of Systems     Objective:   Physical Exam   She has an approximately 4 cm abscess in the right axilla with some surrounding erythema. It does look like it's fine to come to a head but no open wound and no drainage.     Assessment & Plan:  Abscess - Discussed need for I & D. Patient tolerated procedure well.     Incision and Drainage Procedure Note  Pre-operative Diagnosis: abscess   Post-operative Diagnosis: same  Indications: infection, pain  Anesthesia: 1% lidocaine with epinephrine  Procedure Details  The procedure, risks and complications have been discussed in detail (including, but not limited to airway compromise, infection, bleeding) with the patient, and the patient has signed consent to the procedure.  The skin was sterilely prepped and draped over the affected area in the usual fashion. After adequate local anesthesia, I&D with a #11 blade was performed on the midline approx 2 cm. Purulent drainage: present. The wound was probed for any tracts or connecting lesions. I was unable to detect any. Iodoform gauze placed into the open cavity. The patient was observed until stable.  Findings: abscess  EBL: 1 cc's  Drains: iodiform gauze placed  Condition: Tolerated procedure well   Complications: none.

## 2014-01-08 ENCOUNTER — Other Ambulatory Visit: Payer: Self-pay | Admitting: Family Medicine

## 2014-01-08 ENCOUNTER — Ambulatory Visit: Payer: 59 | Admitting: *Deleted

## 2014-01-08 MED ORDER — HYDROCODONE-ACETAMINOPHEN 2.5-325 MG PO TABS: 0.5000 | ORAL_TABLET | Freq: Three times a day (TID) | ORAL | Status: DC | PRN

## 2014-01-08 MED ORDER — HYDROCODONE-ACETAMINOPHEN 5-325 MG PO TABS: 0.5000 | ORAL_TABLET | Freq: Four times a day (QID) | ORAL | Status: DC | PRN

## 2014-01-08 NOTE — Progress Notes (Signed)
Patient: Let us know that she had significant pain in the incision area last night. It kept her awake. Wants and if she could have something to take at bedtime to help her pain. She has been taking 800 mg of ibuprofen for acute pain relief.

## 2014-01-29 ENCOUNTER — Other Ambulatory Visit: Payer: Self-pay | Admitting: *Deleted

## 2014-01-29 DIAGNOSIS — F419 Anxiety disorder, unspecified: Secondary | ICD-10-CM

## 2014-01-29 MED ORDER — AMPHETAMINE-DEXTROAMPHETAMINE 20 MG PO TABS: 20.0000 mg | ORAL_TABLET | Freq: Two times a day (BID) | ORAL | Status: DC

## 2014-01-29 MED ORDER — ALPRAZOLAM 1 MG PO TABS: 1.0000 mg | ORAL_TABLET | Freq: Every evening | ORAL | Status: DC | PRN

## 2014-02-27 ENCOUNTER — Other Ambulatory Visit: Payer: Self-pay | Admitting: *Deleted

## 2014-02-27 DIAGNOSIS — F419 Anxiety disorder, unspecified: Secondary | ICD-10-CM

## 2014-02-27 MED ORDER — ALPRAZOLAM 1 MG PO TABS: 1.0000 mg | ORAL_TABLET | Freq: Every evening | ORAL | Status: DC | PRN

## 2014-02-27 MED ORDER — AMPHETAMINE-DEXTROAMPHETAMINE 20 MG PO TABS: 20.0000 mg | ORAL_TABLET | Freq: Two times a day (BID) | ORAL | Status: DC

## 2014-03-27 ENCOUNTER — Other Ambulatory Visit: Payer: Self-pay | Admitting: *Deleted

## 2014-03-27 MED ORDER — AMPHETAMINE-DEXTROAMPHETAMINE 20 MG PO TABS: 20.0000 mg | ORAL_TABLET | Freq: Two times a day (BID) | ORAL | Status: DC

## 2014-04-30 ENCOUNTER — Other Ambulatory Visit: Payer: Self-pay

## 2014-04-30 DIAGNOSIS — F419 Anxiety disorder, unspecified: Secondary | ICD-10-CM

## 2014-04-30 DIAGNOSIS — F988 Other specified behavioral and emotional disorders with onset usually occurring in childhood and adolescence: Secondary | ICD-10-CM

## 2014-04-30 MED ORDER — AMPHETAMINE-DEXTROAMPHETAMINE 20 MG PO TABS: 20.0000 mg | ORAL_TABLET | Freq: Two times a day (BID) | ORAL | Status: DC

## 2014-04-30 MED ORDER — ALPRAZOLAM 1 MG PO TABS: 1.0000 mg | ORAL_TABLET | Freq: Every evening | ORAL | Status: DC | PRN

## 2014-04-30 NOTE — Telephone Encounter (Signed)
Patient scheduled for follow up.

## 2014-05-07 ENCOUNTER — Ambulatory Visit (INDEPENDENT_AMBULATORY_CARE_PROVIDER_SITE_OTHER): Payer: 59 | Admitting: Family Medicine

## 2014-05-07 ENCOUNTER — Encounter: Payer: Self-pay | Admitting: Family Medicine

## 2014-05-07 VITALS — BP 121/76 | HR 112 | Wt 114.0 lb

## 2014-05-07 DIAGNOSIS — F4389 Other reactions to severe stress: Secondary | ICD-10-CM

## 2014-05-07 DIAGNOSIS — F438 Other reactions to severe stress: Secondary | ICD-10-CM

## 2014-05-07 DIAGNOSIS — F988 Other specified behavioral and emotional disorders with onset usually occurring in childhood and adolescence: Secondary | ICD-10-CM

## 2014-05-07 NOTE — Progress Notes (Signed)
   Subjective:    Patient ID: Lauren Franklin, female    DOB: [birth year], 33 y.o.   MRN: [id number]  HPI ADD- currently on Adderall 20mg  daily. No skipping meals.  Not keeping her awake.  No CP , heart flutter.   Says not sleeping well but from stress, not from the medicatin.   GAD - Using xanax prn. Not using daily.  Not sleeping well. Has been seperated from her hsuband x 2 months. He is an alcoholic and won't stop drinking. She had asked him to move out until he can quit drinking. She is a single mom.    Review of Systems     Objective:   Physical Exam  Constitutional: She is oriented to person, place, and time. She appears well-developed and well-nourished.  HENT:  Head: Normocephalic and atraumatic.  Cardiovascular: Normal rate, regular rhythm and normal heart sounds.   Pulmonary/Chest: Effort normal and breath sounds normal.  Neurological: She is alert and oriented to person, place, and time.  Skin: Skin is warm and dry.  Psychiatric: She has a normal mood and affect. Her behavior is normal.          Assessment & Plan:

## 2014-05-07 NOTE — Assessment & Plan Note (Addendum)
GAD- 7 score of 6.  Discussed that she has some support in her life.  Reminded to just use her xanax prn and if using more frequently come in to talk about another kind of medication.  Work on ways to [employer].

## 2014-05-07 NOTE — Assessment & Plan Note (Signed)
Well controlled. Happy with current regimen. F/U in 4 months.

## 2014-06-02 ENCOUNTER — Telehealth: Payer: Self-pay

## 2014-06-02 DIAGNOSIS — J111 Influenza due to unidentified influenza virus with other respiratory manifestations: Secondary | ICD-10-CM | POA: Insufficient documentation

## 2014-06-02 DIAGNOSIS — R69 Illness, unspecified: Principal | ICD-10-CM

## 2014-06-02 MED ORDER — FLUTICASONE PROPIONATE 50 MCG/ACT NA SUSP: NASAL | Status: DC

## 2014-06-02 MED ORDER — OSELTAMIVIR PHOSPHATE 75 MG PO CAPS: 75.0000 mg | ORAL_CAPSULE | Freq: Two times a day (BID) | ORAL | Status: DC

## 2014-06-02 MED ORDER — IBUPROFEN 800 MG PO TABS: 800.0000 mg | ORAL_TABLET | Freq: Three times a day (TID) | ORAL | Status: DC | PRN

## 2014-06-02 NOTE — Telephone Encounter (Signed)
Patient request a Rx for Antibiotic and nasal spray sent to [facility]. Patient stated that she has been congested X2 days and its not getting any better. Patient is having sore throat due to nasal drainage and headaches. Patient has been taking Ibuprofen. Rhonda Cunningham,CMA

## 2014-06-02 NOTE — Telephone Encounter (Signed)
No antibiotic needed for these symptoms as they are viral most likely, I am going to have an antiviral, and fluticasone nasal spray. Use ibuprofen 800 3 times a day.

## 2014-06-02 NOTE — Assessment & Plan Note (Signed)
Rhinitis, cough, congestion, myalgias, chills, present for 2 days. Treat with Flonase, Tamiflu, ibuprofen.

## 2014-06-05 ENCOUNTER — Other Ambulatory Visit: Payer: Self-pay

## 2014-06-05 DIAGNOSIS — F988 Other specified behavioral and emotional disorders with onset usually occurring in childhood and adolescence: Secondary | ICD-10-CM

## 2014-06-05 MED ORDER — AMPHETAMINE-DEXTROAMPHETAMINE 20 MG PO TABS: 20.0000 mg | ORAL_TABLET | Freq: Two times a day (BID) | ORAL | Status: DC

## 2014-06-09 ENCOUNTER — Ambulatory Visit (INDEPENDENT_AMBULATORY_CARE_PROVIDER_SITE_OTHER): Payer: 59 | Admitting: Family Medicine

## 2014-06-09 VITALS — Ht 64.0 in | Wt 113.0 lb

## 2014-06-09 DIAGNOSIS — R300 Dysuria: Secondary | ICD-10-CM

## 2014-06-09 LAB — POCT URINALYSIS DIPSTICK
Bilirubin, UA: NEGATIVE
GLUCOSE UA: NEGATIVE
Ketones, UA: NEGATIVE
NITRITE UA: POSITIVE
Spec Grav, UA: 1.02
UROBILINOGEN UA: 0.2
pH, UA: 6.5

## 2014-06-09 MED ORDER — CIPROFLOXACIN HCL 500 MG PO TABS: 500.0000 mg | ORAL_TABLET | Freq: Two times a day (BID) | ORAL | Status: AC

## 2014-06-09 NOTE — Progress Notes (Signed)
   Subjective:    Patient ID: Lauren Franklin, female    DOB: [birth year], 33 y.o.   MRN: [id number]  HPI Lauren Franklin reports today with urinary freq/burning that started yesterday. She denies fever or abdominal pain. Lauren Franklin, CMA  No recent antibiotic use. No fevers chills or sweats.  Review of Systems     Objective:   Physical Exam  Constitutional: She appears well-developed and well-nourished.  HENT:  Head: Normocephalic and atraumatic.  Skin: Skin is warm and dry.  Psychiatric: She has a normal mood and affect. Her behavior is normal.          Assessment & Plan:  UTI- will treat with Cipro. Call if not significantly better in 3 days. No urine culture sent to

## 2014-06-11 ENCOUNTER — Encounter: Payer: 59 | Admitting: Sports Medicine

## 2014-07-03 ENCOUNTER — Ambulatory Visit: Payer: 59 | Admitting: Family Medicine

## 2014-07-15 ENCOUNTER — Ambulatory Visit (INDEPENDENT_AMBULATORY_CARE_PROVIDER_SITE_OTHER): Payer: 59 | Admitting: Family Medicine

## 2014-07-15 ENCOUNTER — Encounter: Payer: Self-pay | Admitting: Family Medicine

## 2014-07-15 VITALS — BP 117/79 | HR 104 | Wt 119.0 lb

## 2014-07-15 DIAGNOSIS — K047 Periapical abscess without sinus: Secondary | ICD-10-CM

## 2014-07-15 MED ORDER — AMOXICILLIN-POT CLAVULANATE 500-125 MG PO TABS: ORAL_TABLET | ORAL | Status: AC

## 2014-07-15 MED ORDER — DICLOFENAC SODIUM 50 MG PO TBEC: DELAYED_RELEASE_TABLET | ORAL | Status: DC

## 2014-07-15 MED ORDER — HYDROCODONE-ACETAMINOPHEN 5-325 MG PO TABS: 1.0000 | ORAL_TABLET | Freq: Four times a day (QID) | ORAL | Status: DC | PRN

## 2014-07-15 NOTE — Progress Notes (Signed)
CC: Lauren Franklin is a 33 y.o. female is here for Dental Pain   Subjective: HPI:  Right lower jaw pain and swelling. Began yesterday. Swelling is mild in severity pain is moderate in severity unless taking ibuprofen 800 mg every 4 hours. Nothing else seems to make better, worse with chewing on the right side. No other interventions as of yet. A few days ago she chipped off one of the sides of right lower molars at the site of her discomfort today. She denies fevers, chills, nasal congestion, or any upper respiratory illness concerns.  Accompanied by facial swelling right lower jaw. Other than above no other interventions as of yet  Review Of Systems Outlined In HPI  Past Medical History  Diagnosis Date  . IUD     mirena    Past Surgical History  Procedure Laterality Date  . Cholecystectomy  2000   Family History  Problem Relation Age of Onset  . Diabetes Other     History   Social History  . Marital Status: Married    Spouse Name: Theadore Nan    Number of Children: N/A  . Years of Education: N/A   Occupational History  . CLINIC SCHEDULER    Social History Main Topics  . Smoking status: Former Smoker -- 0.25 packs/day    Types: Cigarettes  . Smokeless tobacco: Not on file     Comment: 3 cig per day. Doesnt smoke at home.   . Alcohol Use: Not on file  . Drug Use: No  . Sexual Activity: Not on file   Other Topics Concern  . Not on file   Social History Narrative   No regular execise.      Objective: BP 117/79 mmHg  Pulse 104  Wt 119 lb (53.978 kg)  General: Alert and Oriented, No Acute Distress HEENT: Pupils equal, round, reactive to light. Conjunctivae clear.  External ears unremarkable, canals clear with intact TMs with appropriate landmarks.  Middle ear appears open without effusion. Pink inferior turbinates.  Moist mucous membranes, pharynx without inflammation nor lesions.  Parotid gland duct opening without any discharge or tenderness. The right inferior second  most posterior  molar is tender to touch without any discharge or purulence from the nearby gums. Shotty right anterior chain lymphadenopathy with mild to moderate swelling just anterior to the right angle of the mandible. Extremities: No peripheral edema.  Strong peripheral pulses.  Mental Status: No depression, anxiety, nor agitation. Skin: Warm and dry.  Assessment & Plan: Lauren Franklin was seen today for dental pain.  Diagnoses and associated orders for this visit:  Dental infection - amoxicillin-clavulanate (AUGMENTIN) 500-125 MG per tablet; Take one by mouth every 8 hours for ten total days. - diclofenac (VOLTAREN) 50 MG EC tablet; Take one tablet every 8 hours only as needed for pain, take with small snack. - HYDROcodone-acetaminophen (NORCO/VICODIN) 5-325 MG per tablet; Take 1-2 tablets by mouth every 6 (six) hours as needed for severe pain.    Dental infection: Start Augmentin, stop ibuprofen and instead begin diclofenac as needed. Vicodin for any persistent intolerable pain. Follow-up with her dentist as soon as possible after the holiday weekend.Signs and symptoms requring emergent/urgent reevaluation were discussed with the patient.  Return if symptoms worsen or fail to improve.

## 2014-07-20 ENCOUNTER — Telehealth: Payer: Self-pay | Admitting: Family Medicine

## 2014-07-20 MED ORDER — FLUCONAZOLE 150 MG PO TABS: ORAL_TABLET | ORAL | Status: DC

## 2014-07-20 NOTE — Telephone Encounter (Signed)
Reports yeast infection since starting augmentin.

## 2014-07-23 ENCOUNTER — Other Ambulatory Visit: Payer: Self-pay

## 2014-07-23 DIAGNOSIS — F988 Other specified behavioral and emotional disorders with onset usually occurring in childhood and adolescence: Secondary | ICD-10-CM

## 2014-07-23 DIAGNOSIS — F419 Anxiety disorder, unspecified: Secondary | ICD-10-CM

## 2014-07-23 MED ORDER — AMPHETAMINE-DEXTROAMPHETAMINE 20 MG PO TABS: 20.0000 mg | ORAL_TABLET | Freq: Two times a day (BID) | ORAL | Status: DC

## 2014-07-23 MED ORDER — ALPRAZOLAM 1 MG PO TABS
1.0000 mg | ORAL_TABLET | Freq: Every evening | ORAL | Status: DC | PRN
Start: 2014-07-23 — End: 2014-09-01

## 2014-07-26 ENCOUNTER — Other Ambulatory Visit: Payer: Self-pay | Admitting: Sports Medicine

## 2014-07-26 MED ORDER — FLUCONAZOLE 150 MG PO TABS: ORAL_TABLET | ORAL | Status: DC

## 2014-07-26 MED ORDER — METRONIDAZOLE 500 MG PO TABS: 500.0000 mg | ORAL_TABLET | Freq: Two times a day (BID) | ORAL | Status: DC

## 2014-07-29 ENCOUNTER — Other Ambulatory Visit: Payer: Self-pay | Admitting: Sports Medicine

## 2014-07-29 MED ORDER — FLUCONAZOLE 150 MG PO TABS: ORAL_TABLET | ORAL | Status: AC

## 2014-07-29 MED ORDER — METRONIDAZOLE 500 MG PO TABS: 500.0000 mg | ORAL_TABLET | Freq: Two times a day (BID) | ORAL | Status: AC

## 2014-08-28 ENCOUNTER — Ambulatory Visit (INDEPENDENT_AMBULATORY_CARE_PROVIDER_SITE_OTHER): Payer: 59 | Admitting: Family Medicine

## 2014-08-28 ENCOUNTER — Encounter: Payer: Self-pay | Admitting: Family Medicine

## 2014-08-28 VITALS — BP 94/58 | HR 89 | Wt 120.0 lb

## 2014-08-28 DIAGNOSIS — J302 Other seasonal allergic rhinitis: Secondary | ICD-10-CM

## 2014-08-28 MED ORDER — FLUTICASONE PROPIONATE 50 MCG/ACT NA SUSP: 2.0000 | Freq: Every day | NASAL | Status: DC

## 2014-08-28 NOTE — Progress Notes (Signed)
   Subjective:    Patient ID: Lauren Franklin, female    DOB: Sep 24, 1980, 34 y.o.   MRN: [id number]  HPI patient here today complaining of seasonal allergies and persistent cough with clear drainage. No fevers chills or sweats. She's not currently taking any prescription medication for her symptoms.  Feels like it is worse at night.  O/W feels well. No tiching in the ears, nose, but occ in the throat.  Occ watery eyes.    Review of Systems     Objective:   Physical Exam  Constitutional: She is oriented to person, place, and time. She appears well-developed and well-nourished.  HENT:  Head: Normocephalic and atraumatic.  Right Ear: External ear normal.  Left Ear: External ear normal.  Nose: Nose normal.  Mouth/Throat: Oropharynx is clear and moist.  TMs and canals are clear.   Eyes: Conjunctivae and EOM are normal. Pupils are equal, round, and reactive to light.  Neck: Neck supple. No thyromegaly present.  Cardiovascular: Normal rate, regular rhythm and normal heart sounds.   Pulmonary/Chest: Effort normal and breath sounds normal. She has no wheezes.  Lymphadenopathy:    She has no cervical adenopathy.  Neurological: She is alert and oriented to person, place, and time.  Skin: Skin is warm and dry.  Psychiatric: She has a normal mood and affect.          Assessment & Plan:  AR - discussed treatment options. Patient prefers to do something topical instead of systemic. We will start with nasal fluticasone. Start with 2 squirts in [place] nostril for about 2 weeks and if working well then reduced down to 1 squirt in each nostril daily for maintenance until the winter season is over. We also discussed possibly using dust mite covers on the pillowcase to see if that would help as well. Call or return if not improving.could try oral antihistamine if needed.

## 2014-08-28 NOTE — Patient Instructions (Signed)
Dust mite covers for your pillow case.

## 2014-09-01 ENCOUNTER — Other Ambulatory Visit: Payer: Self-pay | Admitting: *Deleted

## 2014-09-01 DIAGNOSIS — F988 Other specified behavioral and emotional disorders with onset usually occurring in childhood and adolescence: Secondary | ICD-10-CM

## 2014-09-01 DIAGNOSIS — F419 Anxiety disorder, unspecified: Secondary | ICD-10-CM

## 2014-09-01 MED ORDER — ALPRAZOLAM 1 MG PO TABS: 1.0000 mg | ORAL_TABLET | Freq: Every evening | ORAL | Status: DC | PRN

## 2014-09-01 MED ORDER — AMPHETAMINE-DEXTROAMPHETAMINE 20 MG PO TABS: 20.0000 mg | ORAL_TABLET | Freq: Two times a day (BID) | ORAL | Status: DC

## 2014-09-07 ENCOUNTER — Encounter: Payer: Self-pay | Admitting: Family Medicine

## 2014-09-07 ENCOUNTER — Ambulatory Visit (INDEPENDENT_AMBULATORY_CARE_PROVIDER_SITE_OTHER): Payer: 59 | Admitting: Family Medicine

## 2014-09-07 VITALS — BP 108/63 | HR 104 | Temp 98.8°F | Wt 118.0 lb

## 2014-09-07 DIAGNOSIS — J019 Acute sinusitis, unspecified: Secondary | ICD-10-CM

## 2014-09-07 DIAGNOSIS — J209 Acute bronchitis, unspecified: Secondary | ICD-10-CM

## 2014-09-07 MED ORDER — AZITHROMYCIN 250 MG PO TABS: ORAL_TABLET | ORAL | Status: DC

## 2014-09-07 MED ORDER — FLUCONAZOLE 150 MG PO TABS: 150.0000 mg | ORAL_TABLET | Freq: Once | ORAL | Status: DC

## 2014-09-07 MED ORDER — ALBUTEROL SULFATE HFA 108 (90 BASE) MCG/ACT IN AERS: 2.0000 | INHALATION_SPRAY | Freq: Four times a day (QID) | RESPIRATORY_TRACT | Status: DC | PRN

## 2014-09-07 NOTE — Progress Notes (Signed)
   Subjective:    Patient ID: Lauren Franklin, female    DOB: 1980-08-31, 34 y.o.   MRN: [id number]  HPI Has been sick for 10 days with sinus congestion and cough. Has green, yellow mucous.  Has had fever over the weekend.  Has been using IBU  For fever relief. Took this AM. She has been coughing a lot at night.  Feels fatigued.    Review of Systems     Objective:   Physical Exam  Constitutional: She is oriented to person, place, and time. She appears well-developed and well-nourished.  HENT:  Head: Normocephalic and atraumatic.  Right Ear: External ear normal.  Left Ear: External ear normal.  Nose: Nose normal.  Mouth/Throat: Oropharynx is clear and moist.  TMs and canals are clear.   Eyes: Conjunctivae and EOM are normal. Pupils are equal, round, and reactive to light.  Neck: Neck supple. No thyromegaly present.  Cardiovascular: Normal rate, regular rhythm and normal heart sounds.   Pulmonary/Chest: Effort normal and breath sounds normal. She has no wheezes.  Diffuse rhonchi  Lymphadenopathy:    She has no cervical adenopathy.  Neurological: She is alert and oriented to person, place, and time.  Skin: Skin is warm and dry.  Psychiatric: She has a normal mood and affect.          Assessment & Plan:  Acute sinusitis/bronchitis-will treat with azithromycin. Okay to use over-the-counter cough and cold medications. Call if not significantly better after 5 days. Did go ahead and send in a perception for Diflucan as she typically gets these infections with antibiotics. She also requested a refill on her albuterol. She does tend to need it when she gets sick. We did provide a sample today.

## 2014-10-01 ENCOUNTER — Other Ambulatory Visit: Payer: Self-pay

## 2014-10-01 DIAGNOSIS — F988 Other specified behavioral and emotional disorders with onset usually occurring in childhood and adolescence: Secondary | ICD-10-CM

## 2014-10-01 MED ORDER — AMPHETAMINE-DEXTROAMPHETAMINE 20 MG PO TABS: 20.0000 mg | ORAL_TABLET | Freq: Two times a day (BID) | ORAL | Status: DC

## 2014-10-02 ENCOUNTER — Ambulatory Visit (INDEPENDENT_AMBULATORY_CARE_PROVIDER_SITE_OTHER): Payer: 59 | Admitting: Sports Medicine

## 2014-10-02 VITALS — BP 100/60 | HR 96 | Temp 98.0°F | Resp 16 | Wt 116.0 lb

## 2014-10-02 DIAGNOSIS — N390 Urinary tract infection, site not specified: Secondary | ICD-10-CM | POA: Insufficient documentation

## 2014-10-02 DIAGNOSIS — E860 Dehydration: Secondary | ICD-10-CM

## 2014-10-02 NOTE — Assessment & Plan Note (Signed)
2L IVF given into the right antecubital. Return as needed.

## 2014-10-02 NOTE — Progress Notes (Signed)
  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 Symptoms are moderate, persistent, no fevers or chills, no upper respiratory or lower respiratory symptoms.  Past medical history, Surgical history, Family history not pertinant except as noted below, Social history, Allergies, and medications have been entered into the medical record, reviewed, and no changes needed.   Review of Systems: No fevers, chills, night sweats, weight loss, chest pain, or shortness of breath.   Objective:    General: Well Developed, well nourished, and in no acute distress. Appears weak and tired. Neuro: Alert and oriented x3, extra-ocular muscles intact, sensation grossly intact.  HEENT: Normocephalic, atraumatic, pupils equal round reactive to light, neck supple, no masses, no lymphadenopathy, thyroid nonpalpable.  Skin: Warm and dry, no rashes. Cardiac: Regular rate and rhythm, no murmurs rubs or gallops, no lower extremity edema.  Respiratory: Clear to auscultation bilaterally. Not using accessory muscles, speaking in full sentences.  22-gauge angiocatheter inserted into the right antecubital vein, 2 L of normal saline run over 40 minutes.  Patient felt significantly better, nausea had resolved, she also got some intramuscular Zofran.  Impression and Recommendations:    I spent 40 minutes with this patient, greater than 50% was face-to-face time counseling regarding the diagnosis of dehydration.

## 2014-10-05 ENCOUNTER — Ambulatory Visit: Payer: 59 | Admitting: Sports Medicine

## 2014-10-08 ENCOUNTER — Ambulatory Visit (INDEPENDENT_AMBULATORY_CARE_PROVIDER_SITE_OTHER): Payer: 59 | Admitting: Sports Medicine

## 2014-10-08 VITALS — BP 118/75 | HR 100 | Temp 99.1°F | Wt 122.0 lb

## 2014-10-08 DIAGNOSIS — N3001 Acute cystitis with hematuria: Secondary | ICD-10-CM

## 2014-10-08 DIAGNOSIS — R3911 Hesitancy of micturition: Secondary | ICD-10-CM

## 2014-10-08 LAB — POCT URINALYSIS DIPSTICK
Bilirubin, UA: NEGATIVE
Glucose, UA: NEGATIVE
Ketones, UA: NEGATIVE
Nitrite, UA: NEGATIVE
Protein, UA: NEGATIVE
Spec Grav, UA: 1.025
Urobilinogen, UA: 0.2
pH, UA: 5.5

## 2014-10-08 MED ORDER — CEPHALEXIN 500 MG PO CAPS
500.0000 mg | ORAL_CAPSULE | Freq: Two times a day (BID) | ORAL | Status: DC
Start: 2014-10-08 — End: 2014-10-19

## 2014-10-08 NOTE — Progress Notes (Signed)
   Subjective:    Patient ID: Lauren Franklin, female    DOB: 1980-12-17, 34 y.o.   MRN: [id number]  HPI  Melondy complains of urinary hesitancy for 2 days. Denies back pain, burning during urination, fever, chills or sweats.    Review of Systems     Objective:   Physical Exam        Assessment & Plan:  Urinary hesitancy - U/A (blood - moderate) (leukocytes - small) Urine culture pending

## 2014-10-08 NOTE — Assessment & Plan Note (Signed)
Positive symptoms with pyuria and microscopic hematuria. Urine is sent off for culture, starting Keflex 500 mg twice a day for 7 days.

## 2014-10-10 LAB — URINE CULTURE: Colony Count: 50000

## 2014-10-19 ENCOUNTER — Other Ambulatory Visit: Payer: Self-pay | Admitting: Sports Medicine

## 2014-10-19 MED ORDER — FLUCONAZOLE 150 MG PO TABS: ORAL_TABLET | ORAL | Status: DC

## 2014-10-30 ENCOUNTER — Other Ambulatory Visit: Payer: Self-pay

## 2014-10-30 DIAGNOSIS — F419 Anxiety disorder, unspecified: Secondary | ICD-10-CM

## 2014-10-30 DIAGNOSIS — F988 Other specified behavioral and emotional disorders with onset usually occurring in childhood and adolescence: Secondary | ICD-10-CM

## 2014-10-30 MED ORDER — AMPHETAMINE-DEXTROAMPHETAMINE 20 MG PO TABS: 20.0000 mg | ORAL_TABLET | Freq: Two times a day (BID) | ORAL | Status: DC

## 2014-10-30 MED ORDER — ALPRAZOLAM 1 MG PO TABS: 1.0000 mg | ORAL_TABLET | Freq: Every evening | ORAL | Status: DC | PRN

## 2014-11-18 ENCOUNTER — Telehealth: Payer: Self-pay | Admitting: Family Medicine

## 2014-11-18 MED ORDER — SULFAMETHOXAZOLE-TRIMETHOPRIM 800-160 MG PO TABS: 1.0000 | ORAL_TABLET | Freq: Two times a day (BID) | ORAL | Status: DC

## 2014-11-18 NOTE — Telephone Encounter (Signed)
Patient getting aother boil under the left axilla.  Says started a couple of days ago. Red, with streaking.  NO pustule or active drianage. Doesn't feel poorly, No fever, chills or sweats. Exam revealed 1 cm erythemaous nodule with some slight extension of redness going ditally about 2 cm.  No drinage or pustule.    Recommend warm compresses and tx with oral Bactrim. Call or come in if getting worse.

## 2014-12-04 ENCOUNTER — Other Ambulatory Visit: Payer: Self-pay | Admitting: Family Medicine

## 2014-12-04 DIAGNOSIS — F988 Other specified behavioral and emotional disorders with onset usually occurring in childhood and adolescence: Secondary | ICD-10-CM

## 2014-12-04 MED ORDER — AMPHETAMINE-DEXTROAMPHETAMINE 20 MG PO TABS: 20.0000 mg | ORAL_TABLET | Freq: Two times a day (BID) | ORAL | Status: DC

## 2014-12-22 ENCOUNTER — Ambulatory Visit (INDEPENDENT_AMBULATORY_CARE_PROVIDER_SITE_OTHER): Payer: 59 | Admitting: Sports Medicine

## 2014-12-22 VITALS — BP 120/70 | HR 80 | Temp 98.0°F | Resp 16 | Wt 120.0 lb

## 2014-12-22 DIAGNOSIS — S81011A Laceration without foreign body, right knee, initial encounter: Secondary | ICD-10-CM

## 2014-12-22 NOTE — Assessment & Plan Note (Signed)
Simple and superficial, closed with Dermabond. We can look at this every few days, Dermabond will probably flake off within 10-14 days. Patient is up-to-date on tetanus.

## 2014-12-22 NOTE — Progress Notes (Signed)
  Subjective:    CC: Knee laceration  HPI: Lauren Franklin tripped and fell, impacting her knee earlier today. She had immediate pain, bleeding, there is a small laceration over the patella of her right knee. Pain is moderate, persistent. No radiation.  Past medical history, Surgical history, Family history not pertinant except as noted below, Social history, Allergies, and medications have been entered into the medical record, reviewed, and no changes needed.   Review of Systems: No fevers, chills, night sweats, weight loss, chest pain, or shortness of breath.   Objective:    General: Well Developed, well nourished, and in no acute distress.  Neuro: Alert and oriented x3, extra-ocular muscles intact, sensation grossly intact.  HEENT: Normocephalic, atraumatic, pupils equal round reactive to light, neck supple, no masses, no lymphadenopathy, thyroid nonpalpable.  Skin: Warm and dry, no rashes. Cardiac: Regular rate and rhythm, no murmurs rubs or gallops, no lower extremity edema.  Respiratory: Clear to auscultation bilaterally. Not using accessory muscles, speaking in full sentences. Right Knee: There is a 1.5 inch laceration that goes into the subcutaneous tissues, obliquely, over the right patella. Palpation normal with no warmth or joint line tenderness or patellar tenderness or condyle tenderness. ROM normal in flexion and extension and lower leg rotation. Ligaments with solid consistent endpoints including ACL, PCL, LCL, MCL. Negative Mcmurray's and provocative meniscal tests. Non painful patellar compression. Patellar and quadriceps tendons unremarkable. Hamstring and quadriceps strength is normal.  Laceration repair: Indication: bleeding Location: right anterior knee Size: 1.5 inches Wound explored, irrigated with 60 mL of hydrogen peroxide, no foreign bodies visible. I was able to close the entire wound with Dermabond. Tolerated well Routine postprocedure instructions d/w pt-  keep area clean and bandaged, follow up if concerns/spreading erythema/pain.    Impression and Recommendations:    I spent 40 minutes with this patient, greater than 50% was face-to-face time counseling regarding the above diagnosis

## 2014-12-23 ENCOUNTER — Ambulatory Visit (HOSPITAL_COMMUNITY): Payer: 59 | Admitting: Licensed Clinical Social Worker

## 2015-01-08 ENCOUNTER — Ambulatory Visit (HOSPITAL_COMMUNITY): Payer: 59 | Admitting: Psychiatry

## 2015-01-13 ENCOUNTER — Telehealth: Payer: Self-pay | Admitting: Family Medicine

## 2015-01-13 ENCOUNTER — Other Ambulatory Visit: Payer: Self-pay | Admitting: *Deleted

## 2015-01-13 DIAGNOSIS — F419 Anxiety disorder, unspecified: Secondary | ICD-10-CM

## 2015-01-13 DIAGNOSIS — F988 Other specified behavioral and emotional disorders with onset usually occurring in childhood and adolescence: Secondary | ICD-10-CM

## 2015-01-13 MED ORDER — ALPRAZOLAM 1 MG PO TABS
1.0000 mg | ORAL_TABLET | Freq: Every evening | ORAL | Status: DC | PRN
Start: 2015-01-13 — End: 2015-02-19

## 2015-01-13 MED ORDER — AMPHETAMINE-DEXTROAMPHETAMINE 20 MG PO TABS: 20.0000 mg | ORAL_TABLET | Freq: Two times a day (BID) | ORAL | Status: DC

## 2015-01-13 NOTE — Telephone Encounter (Signed)
Ok to fill if due

## 2015-01-13 NOTE — Telephone Encounter (Signed)
Patient needs med refill on Xanax and adderall please.  thanks

## 2015-01-13 NOTE — Telephone Encounter (Signed)
Done

## 2015-01-25 ENCOUNTER — Ambulatory Visit: Payer: 59 | Admitting: Obstetrics & Gynecology

## 2015-02-04 ENCOUNTER — Encounter: Payer: Self-pay | Admitting: Obstetrics & Gynecology

## 2015-02-04 ENCOUNTER — Ambulatory Visit (INDEPENDENT_AMBULATORY_CARE_PROVIDER_SITE_OTHER): Payer: 59 | Admitting: Obstetrics & Gynecology

## 2015-02-04 VITALS — BP 125/79 | HR 118 | Resp 16 | Ht 64.0 in | Wt 127.0 lb

## 2015-02-04 DIAGNOSIS — Z124 Encounter for screening for malignant neoplasm of cervix: Secondary | ICD-10-CM

## 2015-02-04 DIAGNOSIS — Z0000 Encounter for general adult medical examination without abnormal findings: Secondary | ICD-10-CM

## 2015-02-04 DIAGNOSIS — Z01812 Encounter for preprocedural laboratory examination: Secondary | ICD-10-CM

## 2015-02-04 DIAGNOSIS — Z1151 Encounter for screening for human papillomavirus (HPV): Secondary | ICD-10-CM

## 2015-02-04 DIAGNOSIS — Z30433 Encounter for removal and reinsertion of intrauterine contraceptive device: Secondary | ICD-10-CM | POA: Diagnosis not present

## 2015-02-04 DIAGNOSIS — Z975 Presence of (intrauterine) contraceptive device: Secondary | ICD-10-CM

## 2015-02-04 LAB — POCT URINE PREGNANCY: Preg Test, Ur: NEGATIVE

## 2015-02-04 MED ORDER — LEVONORGESTREL 20 MCG/24HR IU IUD
INTRAUTERINE_SYSTEM | Freq: Once | INTRAUTERINE | Status: AC
  Administered 2015-02-04: 17:00:00 via INTRAUTERINE

## 2015-02-04 NOTE — Progress Notes (Signed)
   Subjective:    Patient ID: Lauren Franklin, female    DOB: 10-01-1980, 34 y.o.   MRN: [id number]  HPI  This 34 yo lady is here to have her 34 yo Mirena replaced with a new one. She is certain that she does not want more kids. She is amenorrheic with Mirena.  Review of Systems Her pap was 5 years ago.    Objective:   Physical Exam [id number] Abd- thin, benign NSSR, NT, mobile, normal adnexal exam UPT negative, consent signed, Time out procedure done. Cervix prepped with betadine and grasped with a single tooth tenaculum. Mirena was easily placed and the strings were cut to 3-4 cm. Uterus sounded to 9 cm. She tolerated the procedure well.         Assessment & Plan:  Contraception- Mirena

## 2015-02-04 NOTE — Addendum Note (Signed)
Addended by: Gretchen Short on: 02/04/2015 04:40 PM   Modules accepted: Medications

## 2015-02-08 LAB — CYTOLOGY - PAP

## 2015-02-11 ENCOUNTER — Telehealth: Payer: Self-pay | Admitting: *Deleted

## 2015-02-11 NOTE — Telephone Encounter (Signed)
Pt notified in person about her abnormal pap smear and that she needs appt for colposcopy.

## 2015-02-11 NOTE — Telephone Encounter (Signed)
-----   Message from Emily Filbert, MD sent at 02/10/2015  8:58 AM EDT ----- She will need a colpo. Thanks

## 2015-02-16 ENCOUNTER — Other Ambulatory Visit: Payer: Self-pay | Admitting: Sports Medicine

## 2015-02-16 MED ORDER — DOXYCYCLINE HYCLATE 100 MG PO TABS: 100.0000 mg | ORAL_TABLET | Freq: Two times a day (BID) | ORAL | Status: AC

## 2015-02-16 NOTE — Telephone Encounter (Signed)
Recurrence of skin and soft tissue infection. Return if no improvement after 7 days of antibiotics

## 2015-02-19 ENCOUNTER — Other Ambulatory Visit: Payer: Self-pay

## 2015-02-19 DIAGNOSIS — F988 Other specified behavioral and emotional disorders with onset usually occurring in childhood and adolescence: Secondary | ICD-10-CM

## 2015-02-19 DIAGNOSIS — F419 Anxiety disorder, unspecified: Secondary | ICD-10-CM

## 2015-02-19 MED ORDER — AMPHETAMINE-DEXTROAMPHETAMINE 20 MG PO TABS: 20.0000 mg | ORAL_TABLET | Freq: Two times a day (BID) | ORAL | Status: DC

## 2015-02-19 MED ORDER — ALPRAZOLAM 1 MG PO TABS: 1.0000 mg | ORAL_TABLET | Freq: Every evening | ORAL | Status: DC | PRN

## 2015-03-08 ENCOUNTER — Ambulatory Visit (INDEPENDENT_AMBULATORY_CARE_PROVIDER_SITE_OTHER): Payer: 59 | Admitting: Obstetrics & Gynecology

## 2015-03-08 ENCOUNTER — Encounter: Payer: Self-pay | Admitting: Obstetrics & Gynecology

## 2015-03-08 VITALS — BP 117/71 | HR 113 | Resp 16 | Ht 65.0 in | Wt 130.0 lb

## 2015-03-08 DIAGNOSIS — Z0189 Encounter for other specified special examinations: Secondary | ICD-10-CM

## 2015-03-08 DIAGNOSIS — Z01812 Encounter for preprocedural laboratory examination: Secondary | ICD-10-CM

## 2015-03-08 DIAGNOSIS — R87810 Cervical high risk human papillomavirus (HPV) DNA test positive: Secondary | ICD-10-CM

## 2015-03-08 DIAGNOSIS — R87612 Low grade squamous intraepithelial lesion on cytologic smear of cervix (LGSIL): Secondary | ICD-10-CM

## 2015-03-08 DIAGNOSIS — IMO0002 Reserved for concepts with insufficient information to code with codable children: Secondary | ICD-10-CM

## 2015-03-08 LAB — POCT URINE PREGNANCY: Preg Test, Ur: NEGATIVE

## 2015-03-08 NOTE — Progress Notes (Signed)
   Subjective:    Patient ID: Lauren Franklin, female    DOB: 11-29-1980, 34 y.o.   MRN: [id number]  HPI  34 yo SW lady here for a colpo due to a LGSIL pap 6/16. She has a new sexual partner.  Review of Systems     Objective:   Physical Exam  UPT negative, consent signed, time out done Cervix prepped with acetic acid. Transformation zone seen in its entirety. Colpo adequate. 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 It was biopsied ECC obtained. She tolerated the procedure well.        Assessment & Plan:  LGSIL pap- await pathology results

## 2015-03-11 ENCOUNTER — Telehealth: Payer: Self-pay

## 2015-03-11 NOTE — Telephone Encounter (Signed)
Patient aware of pathology results of her colposcopy. Patient informed per Dr. Hulan Fray that she will need a LEEP- explained procedure to the patient. Patient will call back/ come by to scheduled appointment. Kathrene Alu RN BSN

## 2015-03-19 ENCOUNTER — Other Ambulatory Visit: Payer: Self-pay

## 2015-03-19 DIAGNOSIS — F988 Other specified behavioral and emotional disorders with onset usually occurring in childhood and adolescence: Secondary | ICD-10-CM

## 2015-03-19 DIAGNOSIS — F419 Anxiety disorder, unspecified: Secondary | ICD-10-CM

## 2015-03-19 MED ORDER — AMPHETAMINE-DEXTROAMPHETAMINE 20 MG PO TABS: 20.0000 mg | ORAL_TABLET | Freq: Two times a day (BID) | ORAL | Status: DC

## 2015-03-19 MED ORDER — ALPRAZOLAM 1 MG PO TABS: 1.0000 mg | ORAL_TABLET | Freq: Every evening | ORAL | Status: DC | PRN

## 2015-04-06 ENCOUNTER — Telehealth: Payer: Self-pay | Admitting: Family Medicine

## 2015-04-06 DIAGNOSIS — Z0000 Encounter for general adult medical examination without abnormal findings: Secondary | ICD-10-CM

## 2015-04-06 NOTE — Telephone Encounter (Signed)
Pt needs lab order for her yearly employee wellness program.

## 2015-04-07 NOTE — Telephone Encounter (Signed)
Labs printed and given to pt.Lauren Franklin

## 2015-04-08 LAB — LIPID PANEL
CHOL/HDL RATIO: 2.7 ratio (ref <=5.0)
CHOLESTEROL: 137 mg/dL (ref 125–200)
HDL: 51 mg/dL (ref >=46)
LDL Cholesterol: 76 mg/dL (ref <130)
Triglycerides: 48 mg/dL (ref <150)
VLDL: 10 mg/dL (ref <30)

## 2015-04-08 LAB — COMPLETE METABOLIC PANEL WITH GFR
ALT: 7 U/L (ref 6–29)
AST: 14 U/L (ref 10–30)
Albumin: 4.3 g/dL (ref 3.6–5.1)
Alkaline Phosphatase: 80 U/L (ref 33–115)
BUN: 12 mg/dL (ref 7–25)
CHLORIDE: 105 mmol/L (ref 98–110)
CO2: 26 mmol/L (ref 20–31)
Calcium: 9.2 mg/dL (ref 8.6–10.2)
Creat: 0.82 mg/dL (ref 0.50–1.10)
GFR, Est African American: >89 mL/min (ref >=60)
GFR, Est Non African American: >89 mL/min (ref >=60)
GLUCOSE: 88 mg/dL (ref 65–99)
POTASSIUM: 4.3 mmol/L (ref 3.5–5.3)
SODIUM: 139 mmol/L (ref 135–146)
Total Bilirubin: 0.7 mg/dL (ref 0.2–1.2)
Total Protein: 7.1 g/dL (ref 6.1–8.1)

## 2015-04-08 LAB — TSH: TSH: 0.615 u[IU]/mL (ref 0.350–4.500)

## 2015-04-28 ENCOUNTER — Other Ambulatory Visit: Payer: Self-pay

## 2015-04-28 DIAGNOSIS — F988 Other specified behavioral and emotional disorders with onset usually occurring in childhood and adolescence: Secondary | ICD-10-CM

## 2015-04-28 DIAGNOSIS — F419 Anxiety disorder, unspecified: Secondary | ICD-10-CM

## 2015-04-28 MED ORDER — AMPHETAMINE-DEXTROAMPHETAMINE 20 MG PO TABS: 20.0000 mg | ORAL_TABLET | Freq: Two times a day (BID) | ORAL | Status: DC

## 2015-04-28 MED ORDER — ALPRAZOLAM 1 MG PO TABS: 1.0000 mg | ORAL_TABLET | Freq: Every evening | ORAL | Status: DC | PRN

## 2015-05-31 ENCOUNTER — Other Ambulatory Visit: Payer: Self-pay | Admitting: Family Medicine

## 2015-05-31 DIAGNOSIS — F988 Other specified behavioral and emotional disorders with onset usually occurring in childhood and adolescence: Secondary | ICD-10-CM

## 2015-05-31 MED ORDER — AMPHETAMINE-DEXTROAMPHETAMINE 20 MG PO TABS
20.0000 mg | ORAL_TABLET | Freq: Two times a day (BID) | ORAL | Status: DC
Start: 2015-05-31 — End: 2015-07-02

## 2015-06-07 ENCOUNTER — Ambulatory Visit (INDEPENDENT_AMBULATORY_CARE_PROVIDER_SITE_OTHER): Payer: 59 | Admitting: Family Medicine

## 2015-06-07 ENCOUNTER — Encounter: Payer: Self-pay | Admitting: Family Medicine

## 2015-06-07 VITALS — BP 125/78 | HR 105 | Wt 124.0 lb

## 2015-06-07 DIAGNOSIS — J0100 Acute maxillary sinusitis, unspecified: Secondary | ICD-10-CM

## 2015-06-07 DIAGNOSIS — J329 Chronic sinusitis, unspecified: Secondary | ICD-10-CM | POA: Insufficient documentation

## 2015-06-07 MED ORDER — PREDNISONE 10 MG PO TABS: 30.0000 mg | ORAL_TABLET | Freq: Every day | ORAL | Status: DC

## 2015-06-07 MED ORDER — IPRATROPIUM BROMIDE 0.06 % NA SOLN: 2.0000 | Freq: Four times a day (QID) | NASAL | Status: DC

## 2015-06-07 NOTE — Progress Notes (Signed)
Lauren Franklin is a 34 y.o. female who presents to [facility]  today for sinus pressure nasal discharge and mild pain. Symptoms present for 2 days. No fevers chills nausea vomiting or diarrhea. She's tried some over-the-counter medicines which have helped a lot. She notes a mild cough. No chest pain palpitations or trouble breathing. She feels well otherwise.   Past Medical History  Diagnosis Date  . IUD     mirena  . ADHD (attention deficit hyperactivity disorder)    Past Surgical History  Procedure Laterality Date  . Cholecystectomy  2000   Social History  Substance Use Topics  . Smoking status: Light Tobacco Smoker -- 0.25 packs/day    Types: Cigarettes  . Smokeless tobacco: Not on file     Comment: 3 cig per day. Doesnt smoke at home.   . Alcohol Use: Not on file   family history includes Diabetes in her other.  ROS as above Medications: Current Outpatient Prescriptions  Medication Sig Dispense Refill  . albuterol (PROVENTIL HFA;VENTOLIN HFA) 108 (90 BASE) MCG/ACT inhaler Inhale 2 puffs into the lungs every 6 (six) hours as needed for wheezing. 1 Inhaler 1  . ALPRAZolam (XANAX) 1 MG tablet Take 1 tablet (1 mg total) by mouth at bedtime as needed for sleep. 30 tablet 0  . amphetamine-dextroamphetamine (ADDERALL) 20 MG tablet Take 1 tablet (20 mg total) by mouth 2 (two) times daily. 60 tablet 0  . levonorgestrel (MIRENA) 20 MCG/24HR IUD 1 each by Intrauterine route once.    Marland Kitchen ipratropium (ATROVENT) 0.06 % nasal spray Place 2 sprays into both nostrils 4 (four) times daily. 15 mL 1  . predniSONE (DELTASONE) 10 MG tablet Take 3 tablets (30 mg total) by mouth daily. 15 tablet 0   No current facility-administered medications for this visit.   No Known Allergies   Exam:  BP 125/78 mmHg  Pulse 105  Wt 124 lb (56.246 kg) Gen: Well NAD HEENT: EOMI,  MMM nontender maxillary sinuses. Clear nasal discharge present. Normal tympanic membranes  bilaterally.  Lungs: Normal work of breathing. CTABL Heart: Regular rhythm mild tachycardia no MRG Abd: NABS, Soft. Nondistended, Nontender Exts: Brisk capillary refill, warm and well perfused.   No results found for this or any previous visit (from the past 24 hour(s)). No results found.   Please see individual assessment and plan sections.

## 2015-06-07 NOTE — Assessment & Plan Note (Signed)
Continue over-the-counter. Treatment with Atrovent nasal spray and prednisone. Return if not better.

## 2015-06-07 NOTE — Patient Instructions (Signed)
Thank you for coming in today. Call or go to the emergency room if you get worse, have trouble breathing, have chest pains, or palpitations.    

## 2015-07-02 ENCOUNTER — Other Ambulatory Visit: Payer: Self-pay | Admitting: *Deleted

## 2015-07-02 DIAGNOSIS — F988 Other specified behavioral and emotional disorders with onset usually occurring in childhood and adolescence: Secondary | ICD-10-CM

## 2015-07-02 DIAGNOSIS — F419 Anxiety disorder, unspecified: Secondary | ICD-10-CM

## 2015-07-02 MED ORDER — AMPHETAMINE-DEXTROAMPHETAMINE 20 MG PO TABS: 20.0000 mg | ORAL_TABLET | Freq: Two times a day (BID) | ORAL | Status: DC

## 2015-07-02 MED ORDER — ALPRAZOLAM 1 MG PO TABS: 1.0000 mg | ORAL_TABLET | Freq: Every evening | ORAL | Status: DC | PRN

## 2015-07-06 ENCOUNTER — Other Ambulatory Visit: Payer: Self-pay | Admitting: *Deleted

## 2015-07-06 DIAGNOSIS — F988 Other specified behavioral and emotional disorders with onset usually occurring in childhood and adolescence: Secondary | ICD-10-CM

## 2015-07-06 MED ORDER — AMPHETAMINE-DEXTROAMPHETAMINE 20 MG PO TABS: 20.0000 mg | ORAL_TABLET | Freq: Two times a day (BID) | ORAL | Status: DC

## 2015-07-12 ENCOUNTER — Ambulatory Visit (INDEPENDENT_AMBULATORY_CARE_PROVIDER_SITE_OTHER): Payer: 59 | Admitting: Sports Medicine

## 2015-07-12 ENCOUNTER — Encounter: Payer: Self-pay | Admitting: Sports Medicine

## 2015-07-12 VITALS — BP 110/73 | HR 93 | Temp 98.3°F

## 2015-07-12 DIAGNOSIS — A084 Viral intestinal infection, unspecified: Secondary | ICD-10-CM

## 2015-07-12 DIAGNOSIS — R112 Nausea with vomiting, unspecified: Secondary | ICD-10-CM | POA: Diagnosis not present

## 2015-07-12 HISTORY — DX: Viral intestinal infection, unspecified: A08.4

## 2015-07-12 MED ORDER — ONDANSETRON 4 MG PO TBDP
8.0000 mg | ORAL_TABLET | Freq: Once | ORAL | Status: AC
  Administered 2015-07-12: 8 mg via ORAL

## 2015-07-12 NOTE — Progress Notes (Signed)
  Subjective:    CC: Nausea and vomiting  HPI: This is a pleasant 34 year old female, she works here in our office, her daughter developed a final gastroenteritis, and unfortunately Keegan developed as well, she's had a couple of days of vomiting, nausea. She feels very weak, and somewhat tired. No constitutional symptoms. Minimal abdominal pain.  Past medical history, Surgical history, Family history not pertinant except as noted below, Social history, Allergies, and medications have been entered into the medical record, reviewed, and no changes needed.   Review of Systems: No fevers, chills, night sweats, weight loss, chest pain, or shortness of breath.   Objective:    General: Well Developed, well nourished, and in no acute distress.  Neuro: Alert and oriented x3, extra-ocular muscles intact, sensation grossly intact.  HEENT: Normocephalic, atraumatic, pupils equal round reactive to light, neck supple, no masses, no lymphadenopathy, thyroid nonpalpable.  Skin: Warm and dry, no rashes. Cardiac: Regular rate and rhythm, no murmurs rubs or gallops, no lower extremity edema.  Respiratory: Clear to auscultation bilaterally. Not using accessory muscles, speaking in full sentences. Abdomen: Soft, nontender, non-distended, normal bowel sounds, no palpable masses, no guarding, rigidity, no rebound tenderness.  20-gauge Angiocath placed in the left cubital vein and 2 L normal saline infused.  Impression and Recommendations:

## 2015-07-12 NOTE — Assessment & Plan Note (Signed)
Zofran 8 mg oral, 2 L normal saline. Follow-up when necessary.

## 2015-07-27 ENCOUNTER — Other Ambulatory Visit: Payer: Self-pay | Admitting: Family Medicine

## 2015-07-27 MED ORDER — IPRATROPIUM BROMIDE 0.06 % NA SOLN: 2.0000 | Freq: Four times a day (QID) | NASAL | Status: DC

## 2015-08-03 ENCOUNTER — Telehealth: Payer: Self-pay | Admitting: Sports Medicine

## 2015-08-03 DIAGNOSIS — J209 Acute bronchitis, unspecified: Secondary | ICD-10-CM | POA: Insufficient documentation

## 2015-08-03 MED ORDER — BENZONATATE 200 MG PO CAPS: 200.0000 mg | ORAL_CAPSULE | Freq: Three times a day (TID) | ORAL | Status: DC | PRN

## 2015-08-03 MED ORDER — HYDROCOD POLST-CPM POLST ER 10-8 MG/5ML PO SUER
5.0000 mL | Freq: Two times a day (BID) | ORAL | Status: DC | PRN
Start: 2015-08-03 — End: 2015-08-10

## 2015-08-03 NOTE — Telephone Encounter (Signed)
Increasing cough with unremarkable pulmonary exam including auscultation. Adding Tessalon Perles and Tussionex.

## 2015-08-09 ENCOUNTER — Other Ambulatory Visit: Payer: Self-pay

## 2015-08-09 DIAGNOSIS — F988 Other specified behavioral and emotional disorders with onset usually occurring in childhood and adolescence: Secondary | ICD-10-CM

## 2015-08-09 MED ORDER — AMPHETAMINE-DEXTROAMPHETAMINE 20 MG PO TABS: 20.0000 mg | ORAL_TABLET | Freq: Two times a day (BID) | ORAL | Status: DC

## 2015-08-10 ENCOUNTER — Ambulatory Visit (INDEPENDENT_AMBULATORY_CARE_PROVIDER_SITE_OTHER): Payer: 59 | Admitting: Family Medicine

## 2015-08-10 VITALS — BP 111/64 | HR 89 | Temp 97.6°F

## 2015-08-10 DIAGNOSIS — R300 Dysuria: Secondary | ICD-10-CM | POA: Diagnosis not present

## 2015-08-10 LAB — POCT URINALYSIS DIPSTICK
Bilirubin, UA: NEGATIVE
Glucose, UA: NEGATIVE
Ketones, UA: NEGATIVE
Nitrite, UA: NEGATIVE
PROTEIN UA: 30
UROBILINOGEN UA: 0.2
pH, UA: 5.5

## 2015-08-10 MED ORDER — CIPROFLOXACIN HCL 500 MG PO TABS: 500.0000 mg | ORAL_TABLET | Freq: Two times a day (BID) | ORAL | Status: AC

## 2015-08-10 NOTE — Patient Instructions (Signed)

## 2015-08-10 NOTE — Progress Notes (Signed)
   Subjective:    Patient ID: Lauren Franklin, female    DOB: 01-Feb-1981, 34 y.o.   MRN: [id number]  HPI  Lauren Franklin complains of dysuria for 1 day. Denies fever, chills or sweats. No worsening or alleviating factors. No N/V.    Review of Systems     Objective:   Physical Exam  Constitutional: She is oriented to person, place, and time. She appears well-developed and well-nourished.  HENT:  Head: Normocephalic and atraumatic.  Eyes: Conjunctivae and EOM are normal.  Cardiovascular: Normal rate.   Pulmonary/Chest: Effort normal.  Neurological: She is alert and oriented to person, place, and time.  Skin: Skin is dry. No pallor.  Psychiatric: She has a normal mood and affect. Her behavior is normal.  Vitals reviewed.         Assessment & Plan:  Dysuria - UA - Blood, moderate Leu, small - Urine culture pending. Will tx with cipro. Call if not better or suddenly gets wrorse  Beatrice Lecher, MD

## 2015-08-12 LAB — URINE CULTURE: Colony Count: 25000

## 2015-08-17 ENCOUNTER — Telehealth: Payer: Self-pay

## 2015-08-17 NOTE — Telephone Encounter (Signed)
-----   Message from Hali Marry, MD sent at 08/16/2015  7:26 PM EST ----- Call pt: urine culture is negative.

## 2015-08-17 NOTE — Telephone Encounter (Signed)
Patient aware of lab results and recommendations. 

## 2015-09-02 ENCOUNTER — Other Ambulatory Visit: Payer: Self-pay | Admitting: Sports Medicine

## 2015-09-02 DIAGNOSIS — Z139 Encounter for screening, unspecified: Secondary | ICD-10-CM

## 2015-09-08 ENCOUNTER — Other Ambulatory Visit: Payer: Self-pay

## 2015-09-08 DIAGNOSIS — F988 Other specified behavioral and emotional disorders with onset usually occurring in childhood and adolescence: Secondary | ICD-10-CM

## 2015-09-08 MED ORDER — AMPHETAMINE-DEXTROAMPHETAMINE 20 MG PO TABS: 20.0000 mg | ORAL_TABLET | Freq: Two times a day (BID) | ORAL | Status: DC

## 2015-09-09 ENCOUNTER — Ambulatory Visit: Payer: Self-pay

## 2015-09-09 DIAGNOSIS — Z139 Encounter for screening, unspecified: Secondary | ICD-10-CM

## 2015-09-09 DIAGNOSIS — M8589 Other specified disorders of bone density and structure, multiple sites: Secondary | ICD-10-CM | POA: Diagnosis not present

## 2015-09-09 MED FILL — AMPHETAMINE SALTS 20 MG TAB: 20 | 30 days supply | Qty: 60 | Fill #0

## 2015-10-08 ENCOUNTER — Other Ambulatory Visit: Payer: Self-pay

## 2015-10-08 DIAGNOSIS — F988 Other specified behavioral and emotional disorders with onset usually occurring in childhood and adolescence: Secondary | ICD-10-CM

## 2015-10-08 MED ORDER — AMPHETAMINE-DEXTROAMPHETAMINE 20 MG PO TABS: 20.0000 mg | ORAL_TABLET | Freq: Two times a day (BID) | ORAL | Status: DC

## 2015-10-08 MED FILL — AMPHETAMINE SALTS 20 MG TAB: 20 | 30 days supply | Qty: 60 | Fill #0

## 2015-10-19 ENCOUNTER — Ambulatory Visit (INDEPENDENT_AMBULATORY_CARE_PROVIDER_SITE_OTHER): Payer: 59 | Admitting: Sports Medicine

## 2015-10-19 VITALS — BP 120/80 | HR 70 | Resp 16 | Wt 124.0 lb

## 2015-10-19 DIAGNOSIS — H8111 Benign paroxysmal vertigo, right ear: Secondary | ICD-10-CM | POA: Diagnosis not present

## 2015-10-19 DIAGNOSIS — H8110 Benign paroxysmal vertigo, unspecified ear: Secondary | ICD-10-CM | POA: Insufficient documentation

## 2015-10-19 MED ORDER — MECLIZINE HCL 25 MG PO TABS: 25.0000 mg | ORAL_TABLET | Freq: Three times a day (TID) | ORAL | Status: DC | PRN

## 2015-10-19 MED ORDER — PREDNISONE 50 MG PO TABS: 50.0000 mg | ORAL_TABLET | Freq: Every day | ORAL | Status: DC

## 2015-10-19 MED ORDER — DIAZEPAM 5 MG PO TABS
5.0000 mg | ORAL_TABLET | Freq: Three times a day (TID) | ORAL | Status: DC | PRN
Start: 2015-10-19 — End: 2015-10-28

## 2015-10-19 MED FILL — MECLIZINE 25 MG TABLET: 25 | 10 days supply | Qty: 30 | Fill #0

## 2015-10-19 MED FILL — diazePAM 5 MG TABS: 5 | 10 days supply | Qty: 30 | Fill #0

## 2015-10-19 MED FILL — predniSONE 50 MG TABS: 50 | 5 days supply | Qty: 5 | Fill #0

## 2015-10-19 NOTE — Patient Instructions (Signed)
Benign Positional Vertigo Vertigo is the feeling that you or your surroundings are moving when they are not. Benign positional vertigo is the most common form of vertigo. The cause of this condition is not serious (is benign). This condition is triggered by certain movements and positions (is positional). This condition can be dangerous if it occurs while you are doing something that could endanger you or others, such as driving.  CAUSES In many cases, the cause of this condition is not known. It may be caused by a disturbance in an area of the inner ear that helps your brain to sense movement and balance. This disturbance can be caused by a viral infection (labyrinthitis), head injury, or repetitive motion. RISK FACTORS This condition is more likely to develop in:  Women.  People who are 50 years of age or older. SYMPTOMS Symptoms of this condition usually happen when you move your head or your eyes in different directions. Symptoms may start suddenly, and they usually last for less than a minute. Symptoms may include:  Loss of balance and falling.  Feeling like you are spinning or moving.  Feeling like your surroundings are spinning or moving.  Nausea and vomiting.  Blurred vision.  Dizziness.  Involuntary eye movement (nystagmus). Symptoms can be mild and cause only slight annoyance, or they can be severe and interfere with daily life. Episodes of benign positional vertigo may return (recur) over time, and they may be triggered by certain movements. Symptoms may improve over time. DIAGNOSIS This condition is usually diagnosed by medical history and a physical exam of the head, neck, and ears. You may be referred to a health care provider who specializes in ear, nose, and throat (ENT) problems (otolaryngologist) or a provider who specializes in disorders of the nervous system (neurologist). You may have additional testing, including:  MRI.  A CT scan.  Eye movement tests. Your  health care provider may ask you to change positions quickly while he or she watches you for symptoms of benign positional vertigo, such as nystagmus. Eye movement may be tested with an electronystagmogram (ENG), caloric stimulation, the Dix-Hallpike test, or the roll test.  An electroencephalogram (EEG). This records electrical activity in your brain.  Hearing tests. TREATMENT Usually, your health care provider will treat this by moving your head in specific positions to adjust your inner ear back to normal. Surgery may be needed in severe cases, but this is rare. In some cases, benign positional vertigo may resolve on its own in 2-4 weeks. HOME CARE INSTRUCTIONS Safety  Move slowly.Avoid sudden body or head movements.  Avoid driving.  Avoid operating heavy machinery.  Avoid doing any tasks that would be dangerous to you or others if a vertigo episode would occur.  If you have trouble walking or keeping your balance, try using a cane for stability. If you feel dizzy or unstable, sit down right away.  Return to your normal activities as told by your health care provider. Ask your health care provider what activities are safe for you. General Instructions  Take over-the-counter and prescription medicines only as told by your health care provider.  Avoid certain positions or movements as told by your health care provider.  Drink enough fluid to keep your urine clear or pale yellow.  Keep all follow-up visits as told by your health care provider. This is important. SEEK MEDICAL CARE IF:  You have a fever.  Your condition gets worse or you develop new symptoms.  Your family or friends   notice any behavioral changes.  Your nausea or vomiting gets worse.  You have numbness or a "pins and needles" sensation. SEEK IMMEDIATE MEDICAL CARE IF:  You have difficulty speaking or moving.  You are always dizzy.  You faint.  You develop severe headaches.  You have weakness in your  legs or arms.  You have changes in your hearing or vision.  You develop a stiff neck.  You develop sensitivity to light.   This information is not intended to replace advice given to you by your health care provider. Make sure you discuss any questions you have with your health care provider.   Document Released: 05/15/2006 Document Revised: 04/28/2015 Document Reviewed: 11/30/2014 Elsevier Interactive Patient Education 2016 [employer]  

## 2015-10-19 NOTE — Assessment & Plan Note (Signed)
With a strongly positive Dix-Hallpike sign to the right. Patient shown how to find YouTube videos on the Epley maneuver, adding Valium, meclizine, prednisone.

## 2015-10-19 NOTE — Progress Notes (Signed)
  Subjective:    CC: dizziness  HPI: For the past several days Lauren Franklin has had occasional dizziness and nausea when she changes head position, no upper respiratory symptoms, no head trauma, no headaches. Symptoms are moderate, persistent. No vomiting. No numbness or tingling on either side of the body, symptoms do not occur without head movement.  Past medical history, Surgical history, Family history not pertinant except as noted below, Social history, Allergies, and medications have been entered into the medical record, reviewed, and no changes needed.   Review of Systems: No fevers, chills, night sweats, weight loss, chest pain, or shortness of breath.   Objective:    General: Well Developed, well nourished, and in no acute distress.  Neuro: Alert and oriented x3, extra-ocular muscles intact, sensation grossly intact.  HEENT: Normocephalic, atraumatic, pupils equal round reactive to light, neck supple, no masses, no lymphadenopathy, thyroid nonpalpable. Oropharynx, nasopharynx, ear canals unremarkable, strongly positive Dix-Hallpike sign to the right with torsional nystagmus. Skin: Warm and dry, no rashes. Cardiac: Regular rate and rhythm, no murmurs rubs or gallops, no lower extremity edema.  Respiratory: Clear to auscultation bilaterally. Not using accessory muscles, speaking in full sentences.  Impression and Recommendations:    I spent 25 minutes with this patient, greater than 50% was face-to-face time counseling regarding the above diagnoses

## 2015-10-28 ENCOUNTER — Ambulatory Visit (INDEPENDENT_AMBULATORY_CARE_PROVIDER_SITE_OTHER): Payer: 59 | Admitting: Family Medicine

## 2015-10-28 ENCOUNTER — Encounter: Payer: Self-pay | Admitting: Family Medicine

## 2015-10-28 VITALS — BP 95/68 | HR 73 | Wt 121.0 lb

## 2015-10-28 DIAGNOSIS — F419 Anxiety disorder, unspecified: Secondary | ICD-10-CM | POA: Diagnosis not present

## 2015-10-28 DIAGNOSIS — Z0189 Encounter for other specified special examinations: Secondary | ICD-10-CM | POA: Diagnosis not present

## 2015-10-28 DIAGNOSIS — F988 Other specified behavioral and emotional disorders with onset usually occurring in childhood and adolescence: Secondary | ICD-10-CM

## 2015-10-28 DIAGNOSIS — F909 Attention-deficit hyperactivity disorder, unspecified type: Secondary | ICD-10-CM | POA: Diagnosis not present

## 2015-10-28 DIAGNOSIS — Z0000 Encounter for general adult medical examination without abnormal findings: Secondary | ICD-10-CM

## 2015-10-28 MED ORDER — AMPHETAMINE-DEXTROAMPHETAMINE 20 MG PO TABS: 20.0000 mg | ORAL_TABLET | Freq: Two times a day (BID) | ORAL | Status: DC

## 2015-10-28 MED ORDER — ALPRAZOLAM 1 MG PO TABS: 1.0000 mg | ORAL_TABLET | Freq: Every evening | ORAL | Status: DC | PRN

## 2015-10-28 NOTE — Progress Notes (Signed)
  Subjective:     Lauren Franklin is a 35 y.o. female and is here for a comprehensive physical exam. The patient reports no problems.  She is using Mirena for birth control.  Social History   Social History  . Marital Status: Legally Separated    Spouse Name: Theadore Nan  . Number of Children: N/A  . Years of Education: N/A   Occupational History  . [employer]ER    Social History Main Topics  . Smoking status: Light Tobacco Smoker -- 0.25 packs/day    Types: Cigarettes  . Smokeless tobacco: Not on file     Comment: 3 cig per day. Doesnt smoke at home.   . Alcohol Use: Not on file  . Drug Use: No  . Sexual Activity:    Partners: Male   Other Topics Concern  . Not on file   Social History Narrative   No regular execise.    Health Maintenance  Topic Date Due  . HIV Screening  01/01/1996  . INFLUENZA VACCINE  03/21/2016  . PAP SMEAR  02/03/2018  . TETANUS/TDAP  07/17/2023    The following portions of the patient's history were reviewed and updated as appropriate: allergies, current medications, past family history, past medical history, past social history, past surgical history and problem list.  Review of Systems A comprehensive review of systems was negative.   Objective:    There were no vitals taken for this visit. General appearance: alert, cooperative and appears stated age Head: Normocephalic, without obvious abnormality, atraumatic Eyes: conj clear, EOMI, PEERLA Ears: normal TM's and external ear canals both ears Nose: Nares normal. Septum midline. Mucosa normal. No drainage or sinus tenderness. Throat: lips, mucosa, and tongue normal; teeth and gums normal Neck: no adenopathy, no carotid bruit, no JVD, supple, symmetrical, trachea midline and thyroid not enlarged, symmetric, no tenderness/mass/nodules Back: symmetric, no curvature. ROM normal. No CVA tenderness. Lungs: clear to auscultation bilaterally Heart: regular rate and rhythm, S1, S2 normal, no  murmur, click, rub or gallop Abdomen: soft, non-tender; bowel sounds normal; no masses,  no organomegaly Extremities: extremities normal, atraumatic, no cyanosis or edema Pulses: 2+ and symmetric Skin: Skin color, texture, turgor normal. No rashes or lesions Lymph nodes: Cervical, supraclavicular, and axillary nodes normal. Neurologic: Alert and oriented X 3, normal strength and tone. Normal symmetric reflexes. Normal coordination and gait    Assessment:    Healthy female exam.      Plan:     See After Visit Summary for Counseling Recommendations  Keep up a regular exercise program and make sure you are eating a healthy diet Try to eat 4 servings of dairy a day, or if you are lactose intolerant take a calcium with vitamin D daily.  Your vaccines are up to date.   ADD F/U  - Doing well on current regimen without any chest pain palpitations insomnia or abnormal weight loss. Using medication appropriately. Okay to refill for the next 90 days.   Anxiety - doing well. Will refill her xanax. Again use sparingly.

## 2015-11-04 MED FILL — ALPRAZolam 1 MG TABS: 1 | 30 days supply | Qty: 30 | Fill #0

## 2015-11-04 MED FILL — AMPHETAMINE SALTS 20 MG TAB: 20 | 90 days supply | Qty: 180 | Fill #0

## 2015-11-29 ENCOUNTER — Ambulatory Visit (INDEPENDENT_AMBULATORY_CARE_PROVIDER_SITE_OTHER): Payer: 59 | Admitting: Family Medicine

## 2015-11-29 VITALS — BP 125/77 | HR 105 | Temp 98.6°F

## 2015-11-29 DIAGNOSIS — R300 Dysuria: Secondary | ICD-10-CM | POA: Diagnosis not present

## 2015-11-29 DIAGNOSIS — N3000 Acute cystitis without hematuria: Secondary | ICD-10-CM | POA: Diagnosis not present

## 2015-11-29 LAB — POCT URINALYSIS DIPSTICK
BILIRUBIN UA: NEGATIVE
GLUCOSE UA: NEGATIVE
KETONES UA: NEGATIVE
Nitrite, UA: POSITIVE
Protein, UA: NEGATIVE
SPEC GRAV UA: 1.025
Urobilinogen, UA: 0.2
pH, UA: 6.5

## 2015-11-29 MED ORDER — CIPROFLOXACIN HCL 500 MG PO TABS: 500.0000 mg | ORAL_TABLET | Freq: Two times a day (BID) | ORAL | Status: AC

## 2015-11-29 MED FILL — CIPROFLOXACIN HCL 500 MG TA: 500 | 3 days supply | Qty: 6 | Fill #0

## 2015-11-29 NOTE — Progress Notes (Signed)
Patient came into clinic today complaining of dysuria x1 day. Pt reports she has not taken anything OTC for symptoms. Pt was able to provide urine sample today for testing. Advised we would inform her of any Rx's sent to pharmacy on file.

## 2015-11-29 NOTE — Progress Notes (Signed)
Dysuria x 1 days. No hematuria. No back pain or fever.  U/A is positive. No recent antibiotic use.  Will tx with cipro. F/U is not improving.    Beatrice Lecher, MD

## 2015-12-08 ENCOUNTER — Telehealth: Payer: Self-pay | Admitting: Family Medicine

## 2015-12-08 MED ORDER — VARENICLINE TARTRATE 0.5 MG X 11 & 1 MG X 42 PO MISC: ORAL | Status: DC

## 2015-12-08 MED FILL — CHANTIX STARTING MONTH BOX: 0.5 MG X 11 | 28 days supply | Qty: 53 | Fill #0

## 2015-12-08 NOTE — Telephone Encounter (Signed)
Rx sent 

## 2015-12-08 NOTE — Telephone Encounter (Signed)
She is interested in starting Chantix, can she get one called into [facility]?

## 2016-02-03 ENCOUNTER — Other Ambulatory Visit: Payer: Self-pay | Admitting: *Deleted

## 2016-02-03 DIAGNOSIS — F988 Other specified behavioral and emotional disorders with onset usually occurring in childhood and adolescence: Secondary | ICD-10-CM

## 2016-02-03 DIAGNOSIS — F419 Anxiety disorder, unspecified: Secondary | ICD-10-CM

## 2016-02-03 MED ORDER — ALPRAZOLAM 1 MG PO TABS: 1.0000 mg | ORAL_TABLET | Freq: Every evening | ORAL | Status: DC | PRN

## 2016-02-03 MED ORDER — AMPHETAMINE-DEXTROAMPHETAMINE 20 MG PO TABS: 20.0000 mg | ORAL_TABLET | Freq: Two times a day (BID) | ORAL | Status: DC

## 2016-02-03 MED FILL — DEXTROAMP-AMPHETAMIN 20 MG: 20 | 90 days supply | Qty: 180 | Fill #0

## 2016-02-03 MED FILL — ALPRAZolam 1 MG TABS: 1 | 30 days supply | Qty: 30 | Fill #0

## 2016-04-12 ENCOUNTER — Encounter: Payer: Self-pay | Admitting: Physician Assistant

## 2016-04-12 ENCOUNTER — Ambulatory Visit (INDEPENDENT_AMBULATORY_CARE_PROVIDER_SITE_OTHER): Payer: 59 | Admitting: Physician Assistant

## 2016-04-12 VITALS — BP 116/63 | HR 90 | Temp 98.6°F | Ht 65.0 in | Wt 123.0 lb

## 2016-04-12 DIAGNOSIS — B001 Herpesviral vesicular dermatitis: Secondary | ICD-10-CM | POA: Diagnosis not present

## 2016-04-12 DIAGNOSIS — J069 Acute upper respiratory infection, unspecified: Secondary | ICD-10-CM | POA: Diagnosis not present

## 2016-04-12 MED ORDER — VALACYCLOVIR HCL 1 G PO TABS: ORAL_TABLET | ORAL | 0 refills | Status: DC

## 2016-04-12 MED ORDER — AMOXICILLIN-POT CLAVULANATE 875-125 MG PO TABS: 1.0000 | ORAL_TABLET | Freq: Two times a day (BID) | ORAL | 0 refills | Status: DC

## 2016-04-12 MED FILL — AMOX-CLAV 875-125 MG TABLET: 875-125 | 10 days supply | Qty: 20 | Fill #0

## 2016-04-12 MED FILL — valACYclovir HCL 1 GM TABS: 1 | 1 days supply | Qty: 4 | Fill #0

## 2016-04-12 NOTE — Progress Notes (Signed)
   Subjective:    Patient ID: Lauren Franklin, female    DOB: [birth year], 35 y.o.   MRN: [id number]  HPI  Patient is a 35 year old female who presents to the clinic with sore throat, body aches, ear achiness for the last 24 hours. There are no other sick contacts in her house. She denies any fever, chills, nausea, vomiting or abdominal pain. She has not done anything to make better. She is concerned about her upcoming beach trip. She wants to be better by this weekend. She did have a cold sore puff up on her left lower lip she is placed abreva. It has helped some.     Review of Systems    see HPI.  Objective:   Physical Exam  Constitutional: She is oriented to person, place, and time. She appears well-developed and well-nourished.  HENT:  Head: Normocephalic and atraumatic.  Right Ear: External ear normal.  Left Ear: External ear normal.  Nose: Nose normal.  TM"s clear bilaterally.  oropharynx erythematous without tonsillar swelling or exudate.    Eyes: Conjunctivae are normal. Right eye exhibits no discharge. Left eye exhibits no discharge.  Neck: Normal range of motion. Neck supple.  Cardiovascular: Normal rate, regular rhythm and normal heart sounds.   Pulmonary/Chest: Effort normal and breath sounds normal.  Lymphadenopathy:    She has no cervical adenopathy.  Neurological: She is alert and oriented to person, place, and time.  Skin:  Left lower lip herpetic vesicle.   Psychiatric: She has a normal mood and affect. Her behavior is normal.          Assessment & Plan:  Acute upper respiratory infection-discuss with patient this is likely viral. It may take some time to clear. I know she is anxious about a trip this weekend to the beach. Discussed symptomatic care with ibuprofen, gargle with salt water, vitamin C. If not feeling better Thursday night can take augmentin for 10 days. Rest and stay hydrated. Tomorrow if still feeling achy can get a toradol 30mg  IM shot.   Fever  blister- sent valtrex to pharmacy.

## 2016-04-12 NOTE — Patient Instructions (Signed)

## 2016-04-26 ENCOUNTER — Ambulatory Visit: Payer: 59 | Admitting: Family Medicine

## 2016-04-27 ENCOUNTER — Ambulatory Visit (INDEPENDENT_AMBULATORY_CARE_PROVIDER_SITE_OTHER): Payer: 59 | Admitting: Family Medicine

## 2016-04-27 ENCOUNTER — Encounter: Payer: Self-pay | Admitting: Family Medicine

## 2016-04-27 VITALS — BP 117/61 | HR 68

## 2016-04-27 DIAGNOSIS — L02412 Cutaneous abscess of left axilla: Secondary | ICD-10-CM

## 2016-04-27 MED ORDER — KETOROLAC TROMETHAMINE 60 MG/2ML IM SOLN
60.0000 mg | Freq: Once | INTRAMUSCULAR | Status: AC
  Administered 2016-04-27: 60 mg via INTRAMUSCULAR

## 2016-04-27 NOTE — Progress Notes (Signed)
Pt tolerated Toradol injection in [place] well, no immediate complications.

## 2016-04-27 NOTE — Progress Notes (Signed)
Subjective:    CC: Abscess left axilla    HPI: Patient says she started to notice a irritated red nodule in the left axilla. She has had a couple of abscesses prior to this. Typically in the past she's caught them early she's been able to take in about X and do warm compresses and they usually resolve. This time almost overnight it not significantly larger. It's now tender and painful. No fevers or chills. No red streaking.   Objective:    General: Well Developed, well nourished, and in no acute distress.  Neuro: Alert and oriented x3, extra-ocular muscles intact, sensation grossly intact.  HEENT: Normocephalic, atraumatic  Skin: Warm and dry, no rashes. She has an approximately 3 cm round very raised erythematous nodule in the left axilla. No surrounding erythema.   Impression and Recommendations:   Abscess, left axilla-discussed treatment with I&D. No need for oral antibiotics at this time as there is no surrounding cellulitis but monitor carefully for ongoing infection.  Follow-up wound care discussed.   Indications: Pain and infection.   Anesthesia: 1% plain lidocaine  Procedure Details  The procedure, risks and complications have been discussed in detail (including, but not limited to airway compromise, infection, bleeding) with the patient, and the patient has signed consent to the procedure.  The skin was sterilely prepped and draped over the affected area in the usual fashion. After adequate local anesthesia, I&D with a #11 blade was performed on the left axilla. Purulent drainage: present.  Area probed with a sterile applicator. No tracts or pockets of infection along the border. The patient was observed until stable.  Findings: Await culture results.   EBL: trace  Drains:  approx 3 in of packing placed.    Condition: Tolerated procedure well   Complications: none.

## 2016-04-27 NOTE — Addendum Note (Signed)
Addended by: Huel Cote on: 04/27/2016 04:27 PM   Modules accepted: Orders

## 2016-04-30 ENCOUNTER — Other Ambulatory Visit: Payer: Self-pay | Admitting: Sports Medicine

## 2016-04-30 LAB — WOUND CULTURE

## 2016-04-30 MED ORDER — DOXYCYCLINE HYCLATE 100 MG PO TABS
100.0000 mg | ORAL_TABLET | Freq: Two times a day (BID) | ORAL | 0 refills | Status: AC
Start: 2016-04-30 — End: 2016-05-07

## 2016-05-08 ENCOUNTER — Telehealth: Payer: Self-pay | Admitting: Family Medicine

## 2016-05-08 MED ORDER — SULFAMETHOXAZOLE-TRIMETHOPRIM 800-160 MG PO TABS: 1.0000 | ORAL_TABLET | Freq: Two times a day (BID) | ORAL | 0 refills | Status: DC

## 2016-05-08 MED FILL — SULFAMETHOXAZOLE-TMP DS TAB: 800-160 | 10 days supply | Qty: 20 | Fill #0

## 2016-05-08 NOTE — Telephone Encounter (Signed)
Getting a new abscess forming in the left axilla.  The other is healing some.  Culture was positive for MRSA.  Will send in Rx for Bactrim DS.  F/U for check before end of week.

## 2016-05-10 ENCOUNTER — Ambulatory Visit (INDEPENDENT_AMBULATORY_CARE_PROVIDER_SITE_OTHER): Payer: 59 | Admitting: Family Medicine

## 2016-05-10 ENCOUNTER — Other Ambulatory Visit: Payer: Self-pay

## 2016-05-10 ENCOUNTER — Encounter: Payer: Self-pay | Admitting: Family Medicine

## 2016-05-10 VITALS — BP 99/60 | HR 102 | Wt 122.0 lb

## 2016-05-10 DIAGNOSIS — L0390 Cellulitis, unspecified: Secondary | ICD-10-CM

## 2016-05-10 DIAGNOSIS — L0291 Cutaneous abscess, unspecified: Secondary | ICD-10-CM | POA: Diagnosis not present

## 2016-05-10 DIAGNOSIS — F988 Other specified behavioral and emotional disorders with onset usually occurring in childhood and adolescence: Secondary | ICD-10-CM

## 2016-05-10 MED ORDER — AMPHETAMINE-DEXTROAMPHETAMINE 20 MG PO TABS: 20.0000 mg | ORAL_TABLET | Freq: Two times a day (BID) | ORAL | 0 refills | Status: DC

## 2016-05-10 MED FILL — DEXTROAMP-AMPHETAMIN 20 MG: 20 | 90 days supply | Qty: 180 | Fill #0

## 2016-05-10 NOTE — Patient Instructions (Addendum)
MRSA FAQs  What is MRSA?  Staphylococcus aureus (pronounced staff-ill-oh-KOK-us AW-ree-us), or "Staph" is a very common germ that about 1 out of every 3 people have on their skin or in their nose. This germ does not cause any problems for most people who have it on their skin. But sometimes it can cause serious infections such as skin or wound infections, pneumonia, or infections of the blood.   Antibiotics are given to kill Staph germs when they cause infections. Some Staph are resistant, meaning they cannot be killed by some antibiotics. "Methicillin-resistant Staphylococcus aureus" or "MRSA" is a type of Staph that is resistant to some of the antibiotics that are often used to treat Staph infections.  Who is most likely to get an MRSA infection?  In the hospital, people who are more likely to get an MRSA infection are people who:  · have other health conditions making them sick  · have been in the hospital or a nursing home  · have been treated with antibiotics.  People who are healthy and who have not been in the hospital or a nursing home can also get MRSA infections. These infections usually involve the skin. More information about this type of MRSA infection, known as "community-associated MRSA" infection, is available from the Centers for Disease Control and Prevention (CDC). http://www.cdc.gov/mrsa  How do I get an MRSA infection?  People who have MRSA germs on their skin or who are infected with MRSA may be able to spread the germ to other people. MRSA can be passed on to bed linens, bed rails, bathroom fixtures, and medical equipment. It can spread to other people on contaminated equipment and on the hands of doctors, nurses, other healthcare providers and visitors.  Can MRSA infections be treated?  Yes, there are antibiotics that can kill MRSA germs. Some patients with MRSA abscesses may need surgery to drain the infection. Your healthcare provider will determine which treatments are best for you.  What  are some of the things that hospitals are doing to prevent MRSA infections?  To prevent MRSA infections, doctors, nurses and other healthcare providers:  · Clean their hands with soap and water or an alcohol-based hand rub before and after caring for every patient.  · Carefully clean hospital rooms and medical equipment.  · Use Contact Precautions when caring for patients with MRSA. Contact Precautions mean:  ¨ Whenever possible, patients with MRSA will have a single room or will share a room only with someone else who also has MRSA.  ¨ Healthcare providers will put on gloves and wear a gown over their clothing while taking care of patients with MRSA.  ¨ Visitors may also be asked to wear a gown and gloves.  ¨ When leaving the room, hospital providers and visitors remove their gown and gloves and clean their hands.  ¨ Patients on Contact Precautions are asked to stay in their hospital rooms as much as possible. They should not go to common areas, such as the gift shop or cafeteria. They may go to other areas of the hospital for treatments and tests.  · May test some patients to see if they have MRSA on their skin. This test involves rubbing a cotton-tipped swab in the patient's nostrils or on the skin.  What can I do to help prevent MRSA infections?  In the hospital  · Make sure that all doctors, nurses, and other healthcare providers clean their hands with soap and water or an alcohol-based hand rub before   and after caring for you.  If you do not see your providers clean their hands, please ask them to do so.  When you go home  · If you have wounds or an intravascular device (such as a catheter or dialysis port) make sure that you know how to take care of them.  Can my friends and family get MRSA when they visit me?  The chance of getting MRSA while visiting a person who has MRSA is very low. To decrease the chance of getting MRSA your family and friends should:  · Clean their hands before they enter your room and  when they leave.  · Ask a healthcare provider if they need to wear protective gowns and gloves when they visit you.  What do I need to do when I go home from the hospital?  To prevent another MRSA infection and to prevent spreading MRSA to others:  · Keep taking any antibiotics prescribed by your doctor. Don't take half-doses or stop before you complete your prescribed course.  · Clean your hands often, especially before and after changing your wound dressing or bandage.  · People who live with you should clean their hands often as well.  · Keep any wounds clean and change bandages as instructed until healed.  · Avoid sharing personal items such as towels or razors.  · Wash and dry your clothes and bed linens in the warmest temperatures recommended on the labels.  · Tell your healthcare providers that you have MRSA. This includes home health nurses and aides, therapists, and personnel in doctors' offices.  · Your doctor may have more instructions for you.  If you have questions, please ask your doctor or nurse.  Developed and co-sponsored by The Society for Healthcare Epidemiology of America (SHEA); Infectious Diseases Society of America (IDSA); American Hospital Association; Association for Professionals in Infection Control and Epidemiology (APIC); Centers for Disease Control and Prevention (CDC); and The Joint Commission.     This information is not intended to replace advice given to you by your health care provider. Make sure you discuss any questions you have with your health care provider.     Document Released: 08/12/2013 Document Reviewed: 10/21/2014  Elsevier Interactive Patient Education ©2016 Elsevier Inc.

## 2016-05-10 NOTE — Progress Notes (Signed)
Patient was seen about a week ago for left axillary abscess. Unfortunately about an inch and a half from this area she started to develop a new abscess. We send in a prescription 2 days ago to cover for MRSA as the previous wound cultured positive for MRSA. She did start the antibiotics on Monday but unfortunately the new lesion has been getting larger and more painful and so she is coming in today for definitive treatment  Incision and Drainage Procedure Note  Pre-operative Diagnosis: abcess  Post-operative Diagnosis: same  Indications: pain, swelling, infection  Anesthesia: 1% lidocaine with epinephrine  Procedure Details  The procedure, risks and complications have been discussed in detail (including, but not limited to airway compromise, infection, bleeding) with the patient, and the patient has signed consent to the procedure.  The skin was sterilely prepped and draped over the affected area in the usual fashion. After adequate local anesthesia, I&D with a #11 blade was performed on the left axilla. Purulent drainage: present. Wound was prepped with sterile applicator. No other pockets or tracts found. The patient was observed until stable.  Findings: Abcess.   EBL: 1 cc's  Drains: Iodoform gauze packing applied.    Condition: Tolerated procedure well I'll up instructions given on how to remove a small amount of the packing daily to allow for wound healing. Once the wound heals recommend a trial of doing Hibiclens scrubs nightly for 7-10 days and then maintenance of twice a week to see if this helps prevent the recurrent abscesses. They are not to the point that I think she has hydradenitis.   Complications: none.

## 2016-06-07 ENCOUNTER — Telehealth: Payer: Self-pay | Admitting: *Deleted

## 2016-06-07 DIAGNOSIS — Z0000 Encounter for general adult medical examination without abnormal findings: Secondary | ICD-10-CM

## 2016-06-07 NOTE — Telephone Encounter (Signed)
Labs ordered .Lauren Franklin [place]

## 2016-06-08 LAB — TSH: TSH: 1.24 m[IU]/L

## 2016-06-08 LAB — LIPID PANEL
Cholesterol: 158 mg/dL (ref 125–200)
HDL: 62 mg/dL (ref >=46)
LDL CALC: 86 mg/dL (ref <130)
Total CHOL/HDL Ratio: 2.5 Ratio (ref <=5.0)
Triglycerides: 48 mg/dL (ref <150)
VLDL: 10 mg/dL (ref <30)

## 2016-06-08 LAB — COMPLETE METABOLIC PANEL WITH GFR
ALT: 8 U/L (ref 6–29)
AST: 13 U/L (ref 10–30)
Albumin: 4.5 g/dL (ref 3.6–5.1)
Alkaline Phosphatase: 93 U/L (ref 33–115)
BILIRUBIN TOTAL: 0.5 mg/dL (ref 0.2–1.2)
BUN: 13 mg/dL (ref 7–25)
CHLORIDE: 105 mmol/L (ref 98–110)
CO2: 25 mmol/L (ref 20–31)
CREATININE: 0.69 mg/dL (ref 0.50–1.10)
Calcium: 9.3 mg/dL (ref 8.6–10.2)
GFR, Est Non African American: >89 mL/min (ref >=60)
GLUCOSE: 81 mg/dL (ref 65–99)
Potassium: 3.9 mmol/L (ref 3.5–5.3)
SODIUM: 140 mmol/L (ref 135–146)
TOTAL PROTEIN: 7.5 g/dL (ref 6.1–8.1)

## 2016-06-12 ENCOUNTER — Ambulatory Visit (INDEPENDENT_AMBULATORY_CARE_PROVIDER_SITE_OTHER): Payer: 59 | Admitting: Sports Medicine

## 2016-06-12 VITALS — BP 114/73 | HR 106 | Temp 98.1°F | Resp 18 | Wt 120.0 lb

## 2016-06-12 DIAGNOSIS — N3000 Acute cystitis without hematuria: Secondary | ICD-10-CM

## 2016-06-12 DIAGNOSIS — R300 Dysuria: Secondary | ICD-10-CM

## 2016-06-12 LAB — POCT URINALYSIS DIPSTICK
Bilirubin, UA: NEGATIVE
Glucose, UA: NEGATIVE
Ketones, UA: NEGATIVE
Nitrite, UA: POSITIVE
Protein, UA: 30
Spec Grav, UA: 1.02
Urobilinogen, UA: 1
pH, UA: 7.5

## 2016-06-12 MED ORDER — NITROFURANTOIN MONOHYD MACRO 100 MG PO CAPS: 100.0000 mg | ORAL_CAPSULE | Freq: Two times a day (BID) | ORAL | 0 refills | Status: DC

## 2016-06-12 MED FILL — NITROFURANTOIN MONO-MCR 100: 100 | 7 days supply | Qty: 14 | Fill #0

## 2016-06-12 NOTE — Assessment & Plan Note (Signed)
Macrobid, urine culture. Return as needed.

## 2016-06-12 NOTE — Progress Notes (Signed)
   Subjective:    Patient ID: Lauren Franklin, female    DOB: 1981-03-28, 35 y.o.   MRN: [id number]  HPI  Lauren Franklin complains of pressure after urination for 2 days. Patient reports recent antibiotic use, Bactrim about a month ago or no recent catheterization. Patient not taken Azo. Denies flank pain, pelvic pain, fever, chills or sweats.   Review of Systems     Objective:   Physical Exam        Assessment & Plan:  Dysuria- UA dip blood-mod, Nit-positive and leu-large. Urine culture pending.

## 2016-06-15 LAB — URINE CULTURE

## 2016-06-21 ENCOUNTER — Other Ambulatory Visit: Payer: Self-pay | Admitting: Sports Medicine

## 2016-06-21 MED ORDER — TRIAMCINOLONE ACETONIDE 0.1 % MT PSTE: 1.0000 "application " | PASTE | Freq: Two times a day (BID) | OROMUCOSAL | 11 refills | Status: DC

## 2016-06-21 MED FILL — TRIAMCINOLONE 0.1% PASTE: 0.1 | 15 days supply | Qty: 5 | Fill #0

## 2016-06-21 NOTE — Telephone Encounter (Signed)
Aphthous ulcers. Adding triamcinolone dental paste.

## 2016-07-21 DIAGNOSIS — H524 Presbyopia: Secondary | ICD-10-CM | POA: Diagnosis not present

## 2016-07-21 DIAGNOSIS — H5203 Hypermetropia, bilateral: Secondary | ICD-10-CM | POA: Diagnosis not present

## 2016-07-21 DIAGNOSIS — H52223 Regular astigmatism, bilateral: Secondary | ICD-10-CM | POA: Diagnosis not present

## 2016-08-08 ENCOUNTER — Ambulatory Visit (INDEPENDENT_AMBULATORY_CARE_PROVIDER_SITE_OTHER): Payer: 59 | Admitting: Family Medicine

## 2016-08-08 ENCOUNTER — Encounter: Payer: Self-pay | Admitting: Family Medicine

## 2016-08-08 VITALS — BP 103/64 | HR 109 | Ht 64.0 in | Wt 118.0 lb

## 2016-08-08 DIAGNOSIS — F988 Other specified behavioral and emotional disorders with onset usually occurring in childhood and adolescence: Secondary | ICD-10-CM

## 2016-08-08 DIAGNOSIS — Z720 Tobacco use: Secondary | ICD-10-CM | POA: Diagnosis not present

## 2016-08-08 DIAGNOSIS — F419 Anxiety disorder, unspecified: Secondary | ICD-10-CM | POA: Diagnosis not present

## 2016-08-08 MED ORDER — AMPHETAMINE-DEXTROAMPHETAMINE 20 MG PO TABS: 20.0000 mg | ORAL_TABLET | Freq: Two times a day (BID) | ORAL | 0 refills | Status: DC

## 2016-08-08 MED ORDER — ALPRAZOLAM 1 MG PO TABS: 1.0000 mg | ORAL_TABLET | Freq: Every evening | ORAL | 2 refills | Status: DC | PRN

## 2016-08-08 NOTE — Progress Notes (Signed)
Subjective:    CC: ADD, Sleep  HPI:  Follow-up for ADD-currently taking Adderall 20 mg BID.  She denies any chest pain shortness of breath or palpitations or insomnia with the medication. She says she typically drinks a couple coffee in the morning and then sips on some type of cold or [facility] as well as sweet tea throughout the day. She says in fact she rarely drinks water.  Insomnia-she is requesting refill on her alprazolam. She says she uses it very sparingly and if she does use it she usually splits in half.   Past medical history, Surgical history, Family history not pertinant except as noted below, Social history, Allergies, and medications have been entered into the medical record, reviewed, and corrections made.   Review of Systems: No fevers, chills, night sweats, weight loss, chest pain, or shortness of breath.   Objective:    General: Well Developed, well nourished, and in no acute distress.  Neuro: Alert and oriented x3, extra-ocular muscles intact, sensation grossly intact.  HEENT: Normocephalic, atraumatic  Skin: Warm and dry, no rashes. Cardiac: Regular rate and rhythm, no murmurs rubs or gallops, no lower extremity edema.  Respiratory: Clear to auscultation bilaterally. Not using accessory muscles, speaking in full sentences.   Impression and Recommendations:    ADD-doing well on current regimen. I am concerned because her pulse is quite elevated today. She also reports that she drinks a significant amount of caffeine. Typically 3 caffeinated beverages daily. We had a long conversation today about mixing stimulants with her current medication regimen. Encouraged her to start drinking more water and to have her vitals including her pulse rechecked in about 2 weeks.  Insomnia-did refill her present. Just reminded her to continue to use sparingly as it can be habit forming.  Tobacco abuse-encourage cessation. She is not quite ready to quit.

## 2016-08-09 MED FILL — DEXTROAMP-AMPHETAMIN 20 MG: 20 | 90 days supply | Qty: 180 | Fill #0

## 2016-08-09 MED FILL — ALPRAZolam 1 MG TABS: 1 | 30 days supply | Qty: 30 | Fill #0

## 2016-08-22 ENCOUNTER — Telehealth: Payer: Self-pay | Admitting: Family Medicine

## 2016-08-22 MED ORDER — DOXYCYCLINE HYCLATE 100 MG PO TABS: 100.0000 mg | ORAL_TABLET | Freq: Two times a day (BID) | ORAL | 0 refills | Status: DC

## 2016-08-22 MED FILL — DOXYCYCLINE HYCLATE 100 MG: 100 | 10 days supply | Qty: 20 | Fill #0

## 2016-08-22 NOTE — Telephone Encounter (Signed)
Patient calls in saying that she is getting another abscess on her axilla. It started over the weekend and is starting to get a little whitehead in the center. She like to go ahead and start an antibiotic and see if she can get it under control before having to come in and get it lanced. Prescription sent to pharmacy for doxycycline. Call if not improving or if getting worse and it will need to be incised and drained.  Beatrice Lecher, MD

## 2016-09-18 ENCOUNTER — Ambulatory Visit (INDEPENDENT_AMBULATORY_CARE_PROVIDER_SITE_OTHER): Payer: 59 | Admitting: Family Medicine

## 2016-09-18 VITALS — BP 126/76 | HR 83 | Temp 99.0°F | Wt 118.0 lb

## 2016-09-18 DIAGNOSIS — R300 Dysuria: Secondary | ICD-10-CM | POA: Diagnosis not present

## 2016-09-18 DIAGNOSIS — N3000 Acute cystitis without hematuria: Secondary | ICD-10-CM

## 2016-09-18 LAB — POCT URINALYSIS DIPSTICK
BILIRUBIN UA: NEGATIVE
GLUCOSE UA: NEGATIVE
Nitrite, UA: NEGATIVE
PH UA: 6
SPEC GRAV UA: 1.025
UROBILINOGEN UA: 1

## 2016-09-18 MED ORDER — CIPROFLOXACIN HCL 500 MG PO TABS: 500.0000 mg | ORAL_TABLET | Freq: Two times a day (BID) | ORAL | 0 refills | Status: AC

## 2016-09-18 NOTE — Progress Notes (Signed)
   Subjective:    Patient ID: Lauren Franklin, female    DOB: [birth year], 36 y.o.   MRN: [id number]  Lauren Franklin complains of discomfort at the end of urinating, urgency for 1 day. She has had this feeling in the past and it was a UTI. Denies fever, chills, sweats or nausea or vomiting.    Dysuria   Associated symptoms include hematuria and urgency. Pertinent negatives include no chills or flank pain.      Review of Systems  Constitutional: Negative for chills, fatigue and fever.  HENT: Negative.   Eyes: Negative.   Respiratory: Negative.   Cardiovascular: Negative.   Genitourinary: Positive for dysuria, hematuria and urgency. Negative for flank pain.  Musculoskeletal: Negative for back pain.       Objective:   Physical Exam        Assessment & Plan:  Dysuria - UA dip positive blood-large and positive Leu-small. Urine culture pending. Start Cipro.

## 2016-09-21 ENCOUNTER — Telehealth: Payer: Self-pay | Admitting: Family Medicine

## 2016-09-21 LAB — URINE CULTURE

## 2016-09-21 MED ORDER — OSELTAMIVIR PHOSPHATE 75 MG PO CAPS: 75.0000 mg | ORAL_CAPSULE | Freq: Every day | ORAL | 0 refills | Status: DC

## 2016-09-21 NOTE — Telephone Encounter (Signed)
Daughter just diagnosed with influenza. She would like to be on prophylaxis as she also works in a [employer].  Beatrice Lecher, MD

## 2016-10-16 ENCOUNTER — Ambulatory Visit: Payer: 59 | Admitting: Physician Assistant

## 2016-10-23 ENCOUNTER — Other Ambulatory Visit: Payer: Self-pay | Admitting: Physician Assistant

## 2016-10-23 ENCOUNTER — Telehealth: Payer: Self-pay

## 2016-10-23 MED ORDER — CIPROFLOXACIN HCL 500 MG PO TABS: 500.0000 mg | ORAL_TABLET | Freq: Two times a day (BID) | ORAL | 0 refills | Status: DC

## 2016-10-23 MED FILL — CIPROFLOXACIN HCL 500 MG TA: 500 | 3 days supply | Qty: 6 | Fill #0

## 2016-10-23 NOTE — Telephone Encounter (Signed)
Sent cipro for 3 days.

## 2016-10-23 NOTE — Telephone Encounter (Signed)
Patient advised.

## 2016-10-23 NOTE — Telephone Encounter (Signed)
Lauren Franklin had an UTI at the end of January. She was given Cipro. She didn't finish antibiotic. She started having symptoms, such as, burning with urination and frequency. She restarted the Cipro but doesn't have enough for a full round. Please advise.

## 2016-11-09 ENCOUNTER — Other Ambulatory Visit: Payer: Self-pay

## 2016-11-09 MED ORDER — AMPHETAMINE-DEXTROAMPHETAMINE 20 MG PO TABS: 20.0000 mg | ORAL_TABLET | Freq: Two times a day (BID) | ORAL | 0 refills | Status: DC

## 2016-11-14 MED FILL — AMPHETAMINE SALTS 20 MG TAB: 20 | 90 days supply | Qty: 180 | Fill #0

## 2016-12-15 DIAGNOSIS — T426X2A Poisoning by other antiepileptic and sedative-hypnotic drugs, intentional self-harm, initial encounter: Secondary | ICD-10-CM | POA: Diagnosis not present

## 2016-12-15 DIAGNOSIS — R5383 Other fatigue: Secondary | ICD-10-CM | POA: Diagnosis not present

## 2016-12-15 DIAGNOSIS — G4700 Insomnia, unspecified: Secondary | ICD-10-CM | POA: Diagnosis not present

## 2016-12-15 DIAGNOSIS — Z720 Tobacco use: Secondary | ICD-10-CM | POA: Diagnosis not present

## 2016-12-15 DIAGNOSIS — Z79899 Other long term (current) drug therapy: Secondary | ICD-10-CM | POA: Diagnosis not present

## 2016-12-15 DIAGNOSIS — T424X1A Poisoning by benzodiazepines, accidental (unintentional), initial encounter: Secondary | ICD-10-CM | POA: Diagnosis not present

## 2016-12-15 DIAGNOSIS — F17200 Nicotine dependence, unspecified, uncomplicated: Secondary | ICD-10-CM | POA: Diagnosis not present

## 2017-01-01 ENCOUNTER — Other Ambulatory Visit: Payer: Self-pay

## 2017-01-01 ENCOUNTER — Ambulatory Visit (INDEPENDENT_AMBULATORY_CARE_PROVIDER_SITE_OTHER): Payer: 59 | Admitting: Family Medicine

## 2017-01-01 ENCOUNTER — Encounter: Payer: Self-pay | Admitting: Family Medicine

## 2017-01-01 ENCOUNTER — Other Ambulatory Visit: Payer: Self-pay | Admitting: Sports Medicine

## 2017-01-01 VITALS — BP 114/68 | HR 96 | Temp 98.6°F | Ht 64.0 in

## 2017-01-01 DIAGNOSIS — L03211 Cellulitis of face: Secondary | ICD-10-CM

## 2017-01-01 MED ORDER — SULFAMETHOXAZOLE-TRIMETHOPRIM 800-160 MG PO TABS: 1.0000 | ORAL_TABLET | Freq: Two times a day (BID) | ORAL | 0 refills | Status: DC

## 2017-01-01 MED ORDER — PREDNISONE 50 MG PO TABS: ORAL_TABLET | ORAL | 0 refills | Status: DC

## 2017-01-01 MED ORDER — CEFTRIAXONE SODIUM 1 G IJ SOLR
1.0000 g | Freq: Once | INTRAMUSCULAR | Status: AC
  Administered 2017-01-01: 1 g via INTRAMUSCULAR

## 2017-01-01 MED ORDER — SULFAMETHOXAZOLE-TRIMETHOPRIM 800-160 MG PO TABS
1.0000 | ORAL_TABLET | Freq: Two times a day (BID) | ORAL | 0 refills | Status: DC
Start: 2017-01-01 — End: 2017-03-14

## 2017-01-01 MED ORDER — PENICILLIN V POTASSIUM 500 MG PO TABS: 500.0000 mg | ORAL_TABLET | Freq: Four times a day (QID) | ORAL | 0 refills | Status: DC

## 2017-01-01 MED FILL — SULFAMETHOXAZOLE/TMP DS TAB: 800-160 | 10 days supply | Qty: 20 | Fill #0

## 2017-01-01 MED FILL — PENICILLIN VK 500 MG TABLET: 500 | 5 days supply | Qty: 20 | Fill #0

## 2017-01-01 NOTE — Progress Notes (Signed)
Subjective:    Patient ID: Lauren Franklin, female    DOB: Jul 21, 1981, 36 y.o.   MRN: [id number]  HPI 33 rolled female comes in today with right-sided facial swelling over the facial cheek area. She denies a known injury or trauma but she does have a little scab on the right side of her cheek. She says is now hurting into her right ear and her right side of her throat. No fevers chills or sweats. She says it so tender it was hard to wash her face this morning. She has had problems with boils under her axilla previously and has come back positive for MRSA. She's been taking 4 ibuprofen for pain relief.   Review of Systems  BP 114/68   Pulse 96   Temp 98.6 F (37 C)   Ht 5\' 4"  (1.626 m)   SpO2 96%     No Known Allergies  Past Medical History:  Diagnosis Date  . ADHD (attention deficit hyperactivity disorder)   . IUD    mirena  . Viral gastroenteritis 07/12/2015    Past Surgical History:  Procedure Laterality Date  . CHOLECYSTECTOMY  2000    Social History   Social History  . Marital status: Legally Separated    Spouse name: Theadore Nan  . Number of children: N/A  . Years of education: N/A   Occupational History  . [employer]   Social History Main Topics  . Smoking status: Current Every Day Smoker    Packs/day: 0.25    Types: Cigarettes  . Smokeless tobacco: Never Used     Comment: 3 cig per day. Doesnt smoke at home.   . Alcohol use Not on file  . Drug use: No  . Sexual activity: Yes    Partners: Male   Other Topics Concern  . Not on file   Social History Narrative   No regular execise.     Family History  Problem Relation Age of Onset  . Diabetes Other     Outpatient Encounter Prescriptions as of 01/01/2017  Medication Sig  . ALPRAZolam (XANAX) 1 MG tablet Take 1 tablet (1 mg total) by mouth at bedtime as needed for sleep.  Marland Kitchen amphetamine-dextroamphetamine (ADDERALL) 20 MG tablet Take 1 tablet (20 mg total) by mouth 2 (two) times  daily.  Marland Kitchen ipratropium (ATROVENT) 0.06 % nasal spray Place 2 sprays into both nostrils 4 (four) times daily.  Marland Kitchen levonorgestrel (MIRENA) 20 MCG/24HR IUD 1 each by Intrauterine route once.  . sulfamethoxazole-trimethoprim (BACTRIM DS,SEPTRA DS) 800-160 MG tablet Take 1 tablet by mouth 2 (two) times daily.  . [DISCONTINUED] ciprofloxacin (CIPRO) 500 MG tablet Take 1 tablet (500 mg total) by mouth 2 (two) times daily. For 3 days.  . [DISCONTINUED] oseltamivir (TAMIFLU) 75 MG capsule Take 1 capsule (75 mg total) by mouth daily. X 10 days  . [DISCONTINUED] valACYclovir (VALTREX) 1000 MG tablet Take 2 tablets twice a day for 1 day.   No facility-administered encounter medications on file as of 01/01/2017.          Objective:   Physical Exam  Constitutional: She is oriented to person, place, and time. She appears well-developed and well-nourished.  HENT:  Head: Normocephalic and atraumatic.    Right Ear: External ear normal.  Left Ear: External ear normal.  Nose: Nose normal.  Mouth/Throat: Oropharynx is clear and moist.  TMs and canals are clear.   Eyes: Conjunctivae and EOM are normal. Pupils are equal, round, and  reactive to light.  Neck: Neck supple. No thyromegaly present.  Cardiovascular: Normal rate, regular rhythm and normal heart sounds.   Pulmonary/Chest: Effort normal and breath sounds normal. She has no wheezes.  Lymphadenopathy:    She has no cervical adenopathy.  Neurological: She is alert and oriented to person, place, and time.  Skin: Skin is warm and dry.  Psychiatric: She has a normal mood and affect.          Assessment & Plan:  Right facial cheek cellulitis-with prior history of MRSA will. Treat with Bactrim DS and Pen-Vee K. One tab twice a day. Recommend warm compresses and ibuprofen as needed. Recheck tomorrow.

## 2017-01-01 NOTE — Addendum Note (Signed)
Addended by: Teddy Spike on: 01/01/2017 04:59 PM   Modules accepted: Orders

## 2017-01-01 NOTE — Patient Instructions (Addendum)
Cellulitis, Adult Cellulitis is a skin infection. The infected area is usually red and sore. This condition occurs most often in the arms and lower legs. It is very important to get treated for this condition. Follow these instructions at home:  Take over-the-counter and prescription medicines only as told by your doctor.  If you were prescribed an antibiotic medicine, take it as told by your doctor. Do not stop taking the antibiotic even if you start to feel better.  Drink enough fluid to keep your pee (urine) clear or pale yellow.  Do not touch or rub the infected area.  Raise (elevate) the infected area above the level of your heart while you are sitting or lying down.  Place warm or cold wet cloths (warm or cold compresses) on the infected area. Do this as told by your doctor.  Keep all follow-up visits as told by your doctor. This is important. These visits let your doctor make sure your infection is not getting worse. Contact a doctor if:  You have a fever.  Your symptoms do not get better after 1-2 days of treatment.  Your bone or joint under the infected area starts to hurt after the skin has healed.  Your infection comes back. This can happen in the same area or another area.  You have a swollen bump in the infected area.  You have new symptoms.  You feel ill and also have muscle aches and pains. Get help right away if:  Your symptoms get worse.  You feel very sleepy.  You throw up (vomit) or have watery poop (diarrhea) for a long time.  There are red streaks coming from the infected area.  Your red area gets larger.  Your red area turns darker. This information is not intended to replace advice given to you by your health care provider. Make sure you discuss any questions you have with your health care provider. Document Released: 01/24/2008 Document Revised: 01/13/2016 Document Reviewed: 06/16/2015 Elsevier Interactive Patient Education  2017 [place] is an infection that affects the skin and the tissues that are near the surface of the skin. It causes the skin to become red, swollen, and painful. The infection is most common on the legs but may also affect other areas, such as the face. With treatment, the infection usually goes away in a few days. If not treated, the infection can spread or lead to other problems, such as abscesses. What are the causes? Erysipelas is caused by bacteria. Most often, it is caused by bacteria called streptococci. The bacteria often enter through a break in the skin, such as a cut, surgical incision, burn, insect bite, open sore, or crack in the skin. Sometimes the source where the bacteria entered is not known. What increases the risk? Some people are at an increased risk for developing erysipelas, including:  Young children.  Elderly people.  People with a weakened body defense system (immune system), such as people with HIV or AIDS.  People who have diabetes.  People who drink too much alcohol.  People who have had recent surgery.  People with yeast infections of the skin.  People who have swollen legs. What are the signs or symptoms? The infection causes a reddened area on the skin. This reddened area may:  Be painful and swollen.  Have a distinct border around it.  Feel itchy and hot.  Develop blisters. Other symptoms may include:  Fever.  Chills.  Nausea and vomiting.  Swollen glands (  lymph nodes).  Headache.  Fatigue.  Loss of appetite. How is this diagnosed? Your health care provider will take your medical history and do a physical exam. He or she will usually be able to diagnose erysipelas by closely examining your skin. How is this treated? Erysipelas can usually be treated effectively with antibiotic medicines. The infection usually gets better within a few days of treatment. Follow these instructions at home:  Take medicines only as  directed by your health care provider.  Take your antibiotic medicine as directed by your health care provider. Finish the antibiotic even if you start to feel better.  If the skin infection is on your leg or arm, elevate the leg or arm to help reduce swelling.  Do not put creams or lotions on the affected area of your skin unless your health care provider instructs you to do that.  Do not share bedding, towels, or washcloths (linens) with other people. Using only your own linens will help to prevent the infection from spreading to others.  Keep all follow-up visits as directed by your health care provider. This is important. Contact a health care provider if:  You have pain or discomfort that is not controlled by medicines.  Your red area of skin gets larger or turns dark in color.  Your skin infection returns in the same area or appears in another area. Get help right away if:  Your fever is getting worse.  Your feelings of illness are getting worse.  You notice red streaks coming from the infected area. This information is not intended to replace advice given to you by your health care provider. Make sure you discuss any questions you have with your health care provider. Document Released: 05/02/2001 Document Revised: 01/13/2016 Document Reviewed: 03/23/2014 Elsevier Interactive Patient Education  2017 [employer].

## 2017-01-02 ENCOUNTER — Ambulatory Visit (INDEPENDENT_AMBULATORY_CARE_PROVIDER_SITE_OTHER): Payer: 59 | Admitting: Family Medicine

## 2017-01-02 DIAGNOSIS — L03211 Cellulitis of face: Secondary | ICD-10-CM | POA: Diagnosis not present

## 2017-01-02 DIAGNOSIS — L0201 Cutaneous abscess of face: Secondary | ICD-10-CM | POA: Diagnosis not present

## 2017-01-02 NOTE — Progress Notes (Signed)
   Subjective:    Patient ID: Lauren Franklin, female    DOB: 06-Dec-1980, 36 y.o.   MRN: [id number]  HPI 36 year old female comes in today complaining of continued right facial swelling. Please see note from yesterday. I felt that she most likely had cellulitis and decided to put her on Bactrim as well as penicillin to cover for staph and for strep. Unfortunately yesterday she started experiencing some itching in her palms and hands after taking the dose of penicillin and remembered having a similar experience in the past. I told her to stop the medication immediately and take 2 Benadryl. Today she says the symptoms have resolved. I did encourage her to go ahead and take to Bactrim this morning.   Review of Systems     Objective:   Physical Exam  Constitutional: She is oriented to person, place, and time. She appears well-developed and well-nourished.  HENT:  Head: Normocephalic and atraumatic.  On the right side of her facial cheek starting from the front of her ear down to the right jawline she is very swollen and very tender.  Eyes: Conjunctivae and EOM are normal.  Cardiovascular: Normal rate.   Pulmonary/Chest: Effort normal.  Neurological: She is alert and oriented to person, place, and time.  Skin: Skin is dry. No pallor.  Psychiatric: She has a normal mood and affect. Her behavior is normal.  Vitals reviewed.   I did take a #15 blade and after cleansing the skin just removed the scab that was in the center of the swollen area. At that point we were able to express some pus and sent for wound culture. Patient tolerated procedure well.      Assessment & Plan:  Facial abscess-after removing the scab was able to express a fair amount of pus from the wound. Wound culture obtained. Will call with results. Continue Bactrim 2 tabs twice a day to cover for possible MRSA infection. Recommend warm compresses.

## 2017-01-05 LAB — WOUND CULTURE
GRAM STAIN: NONE SEEN
Gram Stain: NONE SEEN

## 2017-03-14 ENCOUNTER — Ambulatory Visit (INDEPENDENT_AMBULATORY_CARE_PROVIDER_SITE_OTHER): Payer: 59 | Admitting: Family Medicine

## 2017-03-14 ENCOUNTER — Encounter: Payer: Self-pay | Admitting: Family Medicine

## 2017-03-14 VITALS — BP 91/52 | HR 72 | Wt 119.0 lb

## 2017-03-14 DIAGNOSIS — Z720 Tobacco use: Secondary | ICD-10-CM

## 2017-03-14 DIAGNOSIS — F988 Other specified behavioral and emotional disorders with onset usually occurring in childhood and adolescence: Secondary | ICD-10-CM

## 2017-03-14 MED ORDER — AMPHETAMINE-DEXTROAMPHETAMINE 20 MG PO TABS: 20.0000 mg | ORAL_TABLET | Freq: Two times a day (BID) | ORAL | 0 refills | Status: DC

## 2017-03-14 NOTE — Progress Notes (Signed)
   Subjective:    Patient ID: Lauren Franklin, female    DOB: [birth year], 36 y.o.   MRN: [id number]  HPI Here today for follow-up ADD-currently on 20 mg twice a day. She is doing well on the medication without any chest pain palpitations or increase in blood pressure. No problems with insomnia.  Tobacco abuse-she has really cut back on her smoking. Just makes may be a couple per day.  Review of Systems     Objective:   Physical Exam  Constitutional: She is oriented to person, place, and time. She appears well-developed and well-nourished.  HENT:  Head: Normocephalic and atraumatic.  Cardiovascular: Normal rate, regular rhythm and normal heart sounds.   Pulmonary/Chest: Effort normal and breath sounds normal.  Neurological: She is alert and oriented to person, place, and time.  Skin: Skin is warm and dry.  Psychiatric: She has a normal mood and affect. Her behavior is normal.          Assessment & Plan:  ADD- Well controlled. Continue current regimen. Follow up in  4 months.  RF medications.    Tobacco abuse-encourage cessation. She has cut back but is not ready to quit yet.  Encourage her to schedule her CPE/labs.

## 2017-03-26 MED FILL — DEXTROAMP-AMPHETAMIN 20 MG: 20 | 90 days supply | Qty: 180 | Fill #0

## 2017-05-03 ENCOUNTER — Encounter: Payer: 59 | Admitting: Family Medicine

## 2017-05-16 ENCOUNTER — Ambulatory Visit: Payer: 59 | Admitting: Sports Medicine

## 2017-06-11 ENCOUNTER — Encounter: Payer: 59 | Admitting: Family Medicine

## 2017-06-19 ENCOUNTER — Encounter: Payer: Self-pay | Admitting: Family Medicine

## 2017-06-19 ENCOUNTER — Ambulatory Visit (INDEPENDENT_AMBULATORY_CARE_PROVIDER_SITE_OTHER): Payer: 59 | Admitting: Family Medicine

## 2017-06-19 VITALS — BP 100/58 | HR 68 | Ht 64.0 in | Wt 120.5 lb

## 2017-06-19 DIAGNOSIS — Z0000 Encounter for general adult medical examination without abnormal findings: Secondary | ICD-10-CM

## 2017-06-19 DIAGNOSIS — D649 Anemia, unspecified: Secondary | ICD-10-CM | POA: Diagnosis not present

## 2017-06-19 NOTE — Patient Instructions (Addendum)

## 2017-06-19 NOTE — Progress Notes (Signed)
Subjective:     Lauren Franklin is a 36 y.o. female and is here for a comprehensive physical exam. The patient reports no problems.  She exercises occasionally but does not have a specific routine.  She does still smoke socially.  She actually does not even smoke daily often will skip 3 days at a time.  Social History   Social History  . Marital status: Legally Separated    Spouse name: Lauren Franklin  . Number of children: N/A  . Years of education: N/A   Occupational History  . [employer]   Social History Main Topics  . Smoking status: Current Some Day Smoker    Types: Cigarettes  . Smokeless tobacco: Never Used     Comment: 3 cig per day. Doesnt smoke at home.   . Alcohol use 0.0 oz/week  . Drug use: No  . Sexual activity: Yes    Partners: Male   Other Topics Concern  . Not on file   Social History Narrative   No regular execise.    Health Maintenance  Topic Date Due  . HIV Screening  01/01/1996  . PAP SMEAR  02/03/2018  . TETANUS/TDAP  07/17/2023  . INFLUENZA VACCINE  Completed    The following portions of the patient's history were reviewed and updated as appropriate: allergies, current medications, past family history, past medical history, past social history, past surgical history and problem list.  Review of Systems A comprehensive review of systems was negative.   Objective:    BP (!) 100/58   Pulse 68   Ht 5\' 4"  (1.626 m)   Wt 120 lb 8 oz (54.7 kg)   SpO2 100%   BMI 20.68 kg/m  General appearance: alert, cooperative and appears stated age Head: Normocephalic, without obvious abnormality, atraumatic Eyes: conj clear, EOMI, PEERLA Ears: normal TM's and external ear canals both ears Nose: Nares normal. Septum midline. Mucosa normal. No drainage or sinus tenderness. Throat: lips, mucosa, and tongue normal; teeth and gums normal Neck: no adenopathy, no carotid bruit, no JVD, supple, symmetrical, trachea midline and thyroid not enlarged,  symmetric, no tenderness/mass/nodules Back: symmetric, no curvature. ROM normal. No CVA tenderness. Lungs: clear to auscultation bilaterally Heart: regular rate and rhythm, S1, S2 normal, no murmur, click, rub or gallop Abdomen: soft, non-tender; bowel sounds normal; no masses,  no organomegaly Extremities: extremities normal, atraumatic, no cyanosis or edema Pulses: 2+ and symmetric Skin: Skin color, texture, turgor normal. No rashes or lesions Lymph nodes: Cervical adenopathy: nl and Supraclavicular adenopathy: nl Neurologic: Alert and oriented X 3, normal strength and tone. Normal symmetric reflexes. Normal coordination and gait    Assessment:    Healthy female exam.      Plan:     See After Visit Summary for Counseling Recommendations    Keep up a regular exercise program and make sure you are eating a healthy diet Try to eat 4 servings of dairy a day, or if you are lactose intolerant take a calcium with vitamin D daily.  Your vaccines are up to date.

## 2017-06-20 LAB — COMPLETE METABOLIC PANEL WITH GFR
AG Ratio: 1.7 (calc) (ref 1.0–2.5)
ALT: 8 U/L (ref 6–29)
AST: 13 U/L (ref 10–30)
Albumin: 4.5 g/dL (ref 3.6–5.1)
Alkaline phosphatase (APISO): 72 U/L (ref 33–115)
BILIRUBIN TOTAL: 0.6 mg/dL (ref 0.2–1.2)
BUN: 14 mg/dL (ref 7–25)
CALCIUM: 9.2 mg/dL (ref 8.6–10.2)
CHLORIDE: 106 mmol/L (ref 98–110)
CO2: 27 mmol/L (ref 20–32)
Creat: 0.64 mg/dL (ref 0.50–1.10)
GFR, EST AFRICAN AMERICAN: 133 mL/min/{1.73_m2} (ref >=60)
GFR, Est Non African American: 115 mL/min/{1.73_m2} (ref >=60)
GLUCOSE: 84 mg/dL (ref 65–99)
Globulin: 2.7 g/dL (calc) (ref 1.9–3.7)
POTASSIUM: 4.3 mmol/L (ref 3.5–5.3)
Sodium: 140 mmol/L (ref 135–146)
TOTAL PROTEIN: 7.2 g/dL (ref 6.1–8.1)

## 2017-06-20 LAB — CBC
HEMATOCRIT: 34.2 % — AB (ref 35.0–45.0)
Hemoglobin: 11.6 g/dL — ABNORMAL LOW (ref 11.7–15.5)
MCH: 31.6 pg (ref 27.0–33.0)
MCHC: 33.9 g/dL (ref 32.0–36.0)
MCV: 93.2 fL (ref 80.0–100.0)
MPV: 10.4 fL (ref 7.5–12.5)
Platelets: 313 10*3/uL (ref 140–400)
RBC: 3.67 10*6/uL — ABNORMAL LOW (ref 3.80–5.10)
RDW: 12.4 % (ref 11.0–15.0)
WBC: 6.4 10*3/uL (ref 3.8–10.8)

## 2017-06-20 LAB — TEST AUTHORIZATION

## 2017-06-20 LAB — LIPID PANEL W/REFLEX DIRECT LDL
Cholesterol: 149 mg/dL (ref <200)
HDL: 63 mg/dL (ref >50)
LDL CHOLESTEROL (CALC): 74 mg/dL
Non-HDL Cholesterol (Calc): 86 mg/dL (calc) (ref <130)
TRIGLYCERIDES: 50 mg/dL (ref <150)
Total CHOL/HDL Ratio: 2.4 (calc) (ref <5.0)

## 2017-06-20 LAB — TEST AUTHORIZATION 2

## 2017-06-20 LAB — FERRITIN: Ferritin: 82 ng/mL (ref 10–154)

## 2017-06-20 LAB — FOLATE RBC

## 2017-07-24 ENCOUNTER — Telehealth: Payer: Self-pay | Admitting: Sports Medicine

## 2017-07-24 MED ORDER — MUPIROCIN CALCIUM 2 % NA OINT: 1.0000 "application " | TOPICAL_OINTMENT | Freq: Two times a day (BID) | NASAL | 3 refills | Status: DC

## 2017-07-24 MED FILL — MUPIROCIN 2% OINTMENT: 2 | 10 days supply | Qty: 22 | Fill #0

## 2017-07-24 NOTE — Telephone Encounter (Signed)
Patient with a history of frequent MRSA infections, adding nasal mupirocin, and she will do chlorhexidine baths.

## 2017-08-10 ENCOUNTER — Encounter: Payer: Self-pay | Admitting: Family Medicine

## 2017-08-10 ENCOUNTER — Ambulatory Visit (INDEPENDENT_AMBULATORY_CARE_PROVIDER_SITE_OTHER): Payer: 59 | Admitting: Family Medicine

## 2017-08-10 VITALS — BP 104/65 | HR 68 | Ht 64.0 in | Wt 119.0 lb

## 2017-08-10 DIAGNOSIS — F988 Other specified behavioral and emotional disorders with onset usually occurring in childhood and adolescence: Secondary | ICD-10-CM

## 2017-08-10 MED ORDER — AMPHETAMINE-DEXTROAMPHETAMINE 20 MG PO TABS: 20.0000 mg | ORAL_TABLET | Freq: Two times a day (BID) | ORAL | 0 refills | Status: DC

## 2017-08-10 MED FILL — AMPHETAMINE-DEXTRO 20MG: 20 | 90 days supply | Qty: 180 | Fill #0

## 2017-08-10 NOTE — Progress Notes (Signed)
   Subjective:    Patient ID: Lauren Franklin, female    DOB: 1981/05/07, 36 y.o.   MRN: [id number]  HPI 36 yo ADD -with her current regimen.  No recent chest pain shortness breath palpitations.  She did have gastroenteritis a couple of days ago but it was less than for 24 hours and she is feeling a little better today.  Still a little lightheaded.   Review of Systems     Objective:   Physical Exam  Constitutional: She is oriented to person, place, and time. She appears well-developed and well-nourished.  HENT:  Head: Normocephalic and atraumatic.  Cardiovascular: Normal rate, regular rhythm and normal heart sounds.  Pulmonary/Chest: Effort normal and breath sounds normal.  Neurological: She is alert and oriented to person, place, and time.  Skin: Skin is warm and dry.  Psychiatric: She has a normal mood and affect. Her behavior is normal.          Assessment & Plan:  ADD -continue current regimen.  Refill sent to pharmacy.  Follow-up in 6 months.

## 2017-12-27 ENCOUNTER — Ambulatory Visit: Payer: 59 | Admitting: Family Medicine

## 2017-12-31 ENCOUNTER — Encounter: Payer: Self-pay | Admitting: Family Medicine

## 2017-12-31 ENCOUNTER — Ambulatory Visit (INDEPENDENT_AMBULATORY_CARE_PROVIDER_SITE_OTHER): Payer: 59 | Admitting: Family Medicine

## 2017-12-31 VITALS — BP 110/67 | HR 86 | Ht 64.0 in | Wt 120.0 lb

## 2017-12-31 DIAGNOSIS — D649 Anemia, unspecified: Secondary | ICD-10-CM | POA: Diagnosis not present

## 2017-12-31 DIAGNOSIS — F988 Other specified behavioral and emotional disorders with onset usually occurring in childhood and adolescence: Secondary | ICD-10-CM | POA: Diagnosis not present

## 2017-12-31 MED ORDER — AMPHETAMINE-DEXTROAMPHETAMINE 20 MG PO TABS: 20.0000 mg | ORAL_TABLET | Freq: Two times a day (BID) | ORAL | 0 refills | Status: DC

## 2017-12-31 MED FILL — DEXTROAMP-AMPHETAMIN 20 MG: 20 | 90 days supply | Qty: 180 | Fill #0

## 2017-12-31 NOTE — Progress Notes (Signed)
   Subjective:    Patient ID: Lauren Franklin, female    DOB: [birth year], 37 y.o.   MRN: [id number]  HPI  F/U ADD -currently on Adderall 20 mg twice a day.  Tolerating it well without any side effects.  No chest pain, shortness of breath, palpitations, or insomnia.   Still feeling fatigue.  Last check hemoglobin was low but ferritin was normal.  The Mirena in place that has not had a period in almost 2 years  Review of Systems     Objective:   Physical Exam  Constitutional: She is oriented to person, place, and time. She appears well-developed and well-nourished.  HENT:  Head: Normocephalic and atraumatic.  Cardiovascular: Normal rate, regular rhythm and normal heart sounds.  Pulmonary/Chest: Effort normal and breath sounds normal.  Neurological: She is alert and oriented to person, place, and time.  Skin: Skin is warm and dry.  Psychiatric: She has a normal mood and affect. Her behavior is normal.        Assessment & Plan:  ADD -well.  Refills sent for 90 days.  F/U in 4 months.   Low hemoglobin.  We will recheck CBC.  Last ferritin was actually normal but there could have certainly been some acute inflammation which falsely elevated at so we can always do some additional testing if needed.

## 2018-01-08 ENCOUNTER — Ambulatory Visit: Payer: 59 | Admitting: Family Medicine

## 2018-01-25 ENCOUNTER — Telehealth: Payer: Self-pay | Admitting: Family Medicine

## 2018-02-28 DIAGNOSIS — D649 Anemia, unspecified: Secondary | ICD-10-CM | POA: Diagnosis not present

## 2018-02-28 LAB — CBC WITH DIFFERENTIAL/PLATELET
BASOS ABS: 44 {cells}/uL (ref 0–200)
BASOS PCT: 0.4 %
EOS PCT: 2.5 %
Eosinophils Absolute: 278 cells/uL (ref 15–500)
HCT: 37.3 % (ref 35.0–45.0)
HEMOGLOBIN: 12.6 g/dL (ref 11.7–15.5)
Lymphs Abs: 2353 cells/uL (ref 850–3900)
MCH: 31.4 pg (ref 27.0–33.0)
MCHC: 33.8 g/dL (ref 32.0–36.0)
MCV: 93 fL (ref 80.0–100.0)
MPV: 10.2 fL (ref 7.5–12.5)
Monocytes Relative: 8.1 %
NEUTROS ABS: 7526 {cells}/uL (ref 1500–7800)
Neutrophils Relative %: 67.8 %
Platelets: 329 10*3/uL (ref 140–400)
RBC: 4.01 10*6/uL (ref 3.80–5.10)
RDW: 12.3 % (ref 11.0–15.0)
Total Lymphocyte: 21.2 %
WBC mixed population: 899 cells/uL (ref 200–950)
WBC: 11.1 10*3/uL — AB (ref 3.8–10.8)

## 2018-03-28 NOTE — Telephone Encounter (Signed)
Error. Test

## 2018-03-28 NOTE — Telephone Encounter (Signed)
Error. Test 2

## 2018-04-10 ENCOUNTER — Encounter: Payer: Self-pay | Admitting: Family Medicine

## 2018-04-10 ENCOUNTER — Ambulatory Visit (INDEPENDENT_AMBULATORY_CARE_PROVIDER_SITE_OTHER): Payer: 59 | Admitting: Family Medicine

## 2018-04-10 DIAGNOSIS — F988 Other specified behavioral and emotional disorders with onset usually occurring in childhood and adolescence: Secondary | ICD-10-CM

## 2018-04-10 MED ORDER — AMPHETAMINE-DEXTROAMPHETAMINE 20 MG PO TABS: 20.0000 mg | ORAL_TABLET | Freq: Two times a day (BID) | ORAL | 0 refills | Status: DC

## 2018-04-10 MED FILL — AMPHET-DEXTROAMP 20 MG TAB: 20 | 90 days supply | Qty: 180 | Fill #0

## 2018-04-10 NOTE — Patient Instructions (Signed)
Steps to Quit Smoking Smoking tobacco can be bad for your health. It can also affect almost every organ in your body. Smoking puts you and people around you at risk for many serious long-lasting (chronic) diseases. Quitting smoking is hard, but it is one of the best things that you can do for your health. It is never too late to quit. What are the benefits of quitting smoking? When you quit smoking, you lower your risk for getting serious diseases and conditions. They can include:  Lung cancer or lung disease.  Heart disease.  Stroke.  Heart attack.  Not being able to have children (infertility).  Weak bones (osteoporosis) and broken bones (fractures).  If you have coughing, wheezing, and shortness of breath, those symptoms may get better when you quit. You may also get sick less often. If you are pregnant, quitting smoking can help to lower your chances of having a baby of low birth weight. What can I do to help me quit smoking? Talk with your doctor about what can help you quit smoking. Some things you can do (strategies) include:  Quitting smoking totally, instead of slowly cutting back how much you smoke over a period of time.  Going to in-person counseling. You are more likely to quit if you go to many counseling sessions.  Using resources and support systems, such as: ? Online chats with a counselor. ? Phone quitlines. ? Printed self-help materials. ? Support groups or group counseling. ? Text messaging programs. ? Mobile phone apps or applications.  Taking medicines. Some of these medicines may have nicotine in them. If you are pregnant or breastfeeding, do not take any medicines to quit smoking unless your doctor says it is okay. Talk with your doctor about counseling or other things that can help you.  Talk with your doctor about using more than one strategy at the same time, such as taking medicines while you are also going to in-person counseling. This can help make  quitting easier. What things can I do to make it easier to quit? Quitting smoking might feel very hard at first, but there is a lot that you can do to make it easier. Take these steps:  Talk to your family and friends. Ask them to support and encourage you.  Call phone quitlines, reach out to support groups, or work with a counselor.  Ask people who smoke to not smoke around you.  Avoid places that make you want (trigger) to smoke, such as: ? Bars. ? Parties. ? Smoke-break areas at work.  Spend time with people who do not smoke.  Lower the stress in your life. Stress can make you want to smoke. Try these things to help your stress: ? Getting regular exercise. ? Deep-breathing exercises. ? Yoga. ? Meditating. ? Doing a body scan. To do this, close your eyes, focus on one area of your body at a time from head to toe, and notice which parts of your body are tense. Try to relax the muscles in those areas.  Download or buy apps on your mobile phone or tablet that can help you stick to your quit plan. There are many free apps, such as QuitGuide from the CDC (Centers for Disease Control and Prevention). You can find more support from smokefree.gov and other websites.  This information is not intended to replace advice given to you by your health care provider. Make sure you discuss any questions you have with your health care provider. Document Released: 06/03/2009 Document   Revised: 04/04/2016 Document Reviewed: 12/22/2014 Elsevier Interactive Patient Education  2018 [employer]  

## 2018-04-10 NOTE — Progress Notes (Signed)
   Subjective:    Patient ID: Lauren Franklin, female    DOB: 1981/01/27, 37 y.o.   MRN: [id number]  HPI 37 year old female is here today to follow-up for ADD.  She is tolerating medication well without any side effects.  No chest pain, shortness of breath or palpitations.  No insomnia.  She takes her medication during the workweek and on some weekends to pending on what she has to do.   Review of Systems     Objective:   Physical Exam  Constitutional: She is oriented to person, place, and time. She appears well-developed and well-nourished.  HENT:  Head: Normocephalic and atraumatic.  Cardiovascular: Normal rate, regular rhythm and normal heart sounds.  Pulmonary/Chest: Effort normal and breath sounds normal.  Neurological: She is alert and oriented to person, place, and time.  Skin: Skin is warm and dry.  Psychiatric: She has a normal mood and affect. Her behavior is normal.          Assessment & Plan:  ADD - doing well on current regimen. RF sent ot pharmacy. F/U in 3 months.

## 2018-04-16 ENCOUNTER — Ambulatory Visit: Payer: 59 | Admitting: Family Medicine

## 2018-04-25 ENCOUNTER — Ambulatory Visit: Payer: 59

## 2018-04-25 NOTE — Progress Notes (Unsigned)
Subjective:   Janasia Coverdale is a 37 y.o. female who presents for Medicare Annual (Subsequent) preventive examination.  Review of Systems:  ***       Objective:     Vitals: There were no vitals taken for this visit.  There is no height or weight on file to calculate BMI.  Advanced Directives 04/25/2018  Does Patient Have a Medical Advance Directive? No    Tobacco Social History   Tobacco Use  Smoking Status Current Some Day Smoker  . Types: Cigarettes  Smokeless Tobacco Never Used  Tobacco Comment   3 cig per day. Doesnt smoke at home.      Ready to quit: Not Answered Counseling given: Not Answered Comment: 3 cig per day. Doesnt smoke at home.    Clinical Intake:  Pre-visit preparation completed: Yes  Pain : No/denies pain     Nutritional Risks: None Diabetes: No  How often do you need to have someone help you when you read instructions, pamphlets, or other written materials from your doctor or pharmacy?: 1 - Never What is the last grade level you completed in school?: 12th  Interpreter Needed?: No     Past Medical History:  Diagnosis Date  . ADHD (attention deficit hyperactivity disorder)   . IUD    mirena  . Viral gastroenteritis 07/12/2015   Past Surgical History:  Procedure Laterality Date  . CHOLECYSTECTOMY  2000   Family History  Problem Relation Age of Onset  . Diabetes Other    Social History   Socioeconomic History  . Marital status: Married    Spouse name: Theadore Nan  . Number of children: Not on file  . Years of education: Not on file  . Highest education level: Not on file  Occupational History  . Occupation: Dispensing optician: [employer]  . Financial resource strain: Not on file  . Food insecurity:    Worry: Not on file    Inability: Not on file  . Transportation needs:    Medical: Not on file    Non-medical: Not on file  Tobacco Use  . Smoking status: Current Some Day Smoker   Types: Cigarettes  . Smokeless tobacco: Never Used  . Tobacco comment: 3 cig per day. Doesnt smoke at home.   Substance and Sexual Activity  . Alcohol use: Yes    Alcohol/week: 0.0 standard drinks  . Drug use: No  . Sexual activity: Yes    Partners: Male  Lifestyle  . Physical activity:    Days per week: Not on file    Minutes per session: Not on file  . Stress: Not on file  Relationships  . Social connections:    Talks on phone: Not on file    Gets together: Not on file    Attends religious service: Not on file    Active member of club or organization: Not on file    Attends meetings of clubs or organizations: Not on file    Relationship status: Not on file  Other Topics Concern  . Not on file  Social History Narrative   No regular execise.     Outpatient Encounter Medications as of 04/25/2018  Medication Sig  . amphetamine-dextroamphetamine (ADDERALL) 20 MG tablet Take 1 tablet (20 mg total) by mouth 2 (two) times daily.  Marland Kitchen levonorgestrel (MIRENA) 20 MCG/24HR IUD 1 each by Intrauterine route once.   No facility-administered encounter medications on file as of 04/25/2018.  Activities of Daily Living In your present state of health, do you have any difficulty performing the following activities: 04/25/2018  Preparing Food and eating ? N  Using the Toilet? N  In the past six months, have you accidently leaked urine? N  Do you have problems with loss of bowel control? N  Managing your Medications? Y  Managing your Finances? Y  Housekeeping or managing your Housekeeping? Y  Some recent data might be hidden    Patient Care Team: Hali Marry, MD as PCP - General    Assessment:   This is a routine wellness examination for [place].  Exercise Activities and Dietary recommendations    Goals   None     Fall Risk Fall Risk  04/25/2018 04/25/2018  Falls in the past year? No No   Is the patient's home free of loose throw rugs in walkways, pet beds, electrical  cords, etc?   {Blank single:19197::"yes","no"}      Grab bars in the bathroom? {Blank single:19197::"yes","no"}      Handrails on the stairs?   {Blank single:19197::"yes","no"}      Adequate lighting?   {Blank single:19197::"yes","no"}  Timed Get Up and Go performed: ***  Depression Screen PHQ 2/9 Scores 04/25/2018 06/19/2017  PHQ - 2 Score 0 0  Exception Documentation Patient refusal -     Cognitive Function MMSE - Mini Mental State Exam 04/25/2018  Orientation to time 5  Registration 3  Recall 3  Language- repeat 1  Language- read & follow direction 1        Immunization History  Administered Date(s) Administered  . Influenza,inj,Quad PF,6+ Mos 06/13/2013, 06/11/2015, 06/08/2016  . Influenza-Unspecified 05/28/2017  . Tdap 07/16/2013    Qualifies for Shingles Vaccine?***  Screening Tests Health Maintenance  Topic Date Due  . HIV Screening  01/01/1996  . INFLUENZA VACCINE  07/19/2018 (Originally 03/21/2018)  . PAP SMEAR  07/19/2018 (Originally 02/03/2018)  . TETANUS/TDAP  07/17/2023    Cancer Screenings: Lung: Low Dose CT Chest recommended if Age 38-80 years, 30 pack-year currently smoking OR have quit w/in 15years. Patient {DOES NOT does:27190::"does not"} qualify. Breast:  Up to date on Mammogram? {Yes/No:30480221}   Up to date of Bone Density/Dexa? {Yes/No:30480221} Colorectal: ***  Additional Screenings: ***: Hepatitis C Screening:      Plan:   ***   I have personally reviewed and noted the following in the patient's chart:   . Medical and social history . Use of alcohol, tobacco or illicit drugs  . Current medications and supplements . Functional ability and status . Nutritional status . Physical activity . Advanced directives . List of other physicians . Hospitalizations, surgeries, and ER visits in previous 12 months . Vitals . Screenings to include cognitive, depression, and falls . Referrals and appointments  In addition, I have reviewed and  discussed with patient certain preventive protocols, quality metrics, and best practice recommendations. A written personalized care plan for preventive services as well as general preventive health recommendations were provided to patient.     Joanne Chars, LPN  11/23/8183

## 2018-05-21 ENCOUNTER — Encounter: Payer: Self-pay | Admitting: Family Medicine

## 2018-05-21 ENCOUNTER — Ambulatory Visit (INDEPENDENT_AMBULATORY_CARE_PROVIDER_SITE_OTHER): Payer: 59 | Admitting: Family Medicine

## 2018-05-21 VITALS — BP 97/65 | HR 77 | Ht 64.0 in | Wt 122.0 lb

## 2018-05-21 DIAGNOSIS — H8111 Benign paroxysmal vertigo, right ear: Secondary | ICD-10-CM

## 2018-05-21 MED ORDER — MECLIZINE HCL 25 MG PO TABS: 25.0000 mg | ORAL_TABLET | Freq: Three times a day (TID) | ORAL | 0 refills | Status: DC | PRN

## 2018-05-21 MED FILL — MECLIZINE 25 MG TABLET: 25 | 10 days supply | Qty: 30 | Fill #0

## 2018-05-21 NOTE — Progress Notes (Signed)
Subjective:    Patient ID: Lauren Franklin, female    DOB: July 19, 1981, 37 y.o.   MRN: [id number]  HPI 37 year old female is here today for vertigo.  She said she noticed recently that she would feel just a little dizzy when she would bend over and pick things up like a dog toy off the floor but suddenly yesterday she felt like the vertigo was quite intense to the point where she tried to come to work and had to leave and was unable to drive herself home.  She describes it as room spinning.  She denies any ear pain or pressure recent cold symptoms.  She said she felt very tired and nauseated and weak yesterday so she went home and really push her fluids and hydrated well.  She does feel a little better today but is still experiencing some significant dizziness symptoms.  She has had benign positional vertigo before and tried to find her meclizine but could not find it.   Review of Systems  There were no vitals taken for this visit.    Allergies  Allergen Reactions  . Penicillins Itching    Past Medical History:  Diagnosis Date  . ADHD (attention deficit hyperactivity disorder)   . IUD    mirena  . Viral gastroenteritis 07/12/2015    Past Surgical History:  Procedure Laterality Date  . CHOLECYSTECTOMY  2000    Social History   Socioeconomic History  . Marital status: Married    Spouse name: Louie Casa  . Number of children: Not on file  . Years of education: Not on file  . Highest education level: Not on file  Occupational History  . Occupation: Dispensing optician: [employer]  . Financial resource strain: Not on file  . Food insecurity:    Worry: Never true    Inability: Never true  . Transportation needs:    Medical: No    Non-medical: No  Tobacco Use  . Smoking status: Current Some Day Smoker    Types: Cigarettes  . Smokeless tobacco: Never Used  . Tobacco comment: 3 cig per day. Doesnt smoke at home.   Substance and Sexual  Activity  . Alcohol use: Yes    Alcohol/week: 0.0 standard drinks  . Drug use: No  . Sexual activity: Yes    Partners: Male  Lifestyle  . Physical activity:    Days per week: 1 day    Minutes per session: 10 min  . Stress: Only a little  Relationships  . Social connections:    Talks on phone: Once a week    Gets together: Once a week    Attends religious service: More than 4 times per year    Active member of club or organization: No    Attends meetings of clubs or organizations: Not on file    Relationship status: Married  . Intimate partner violence:    Fear of current or ex partner: No    Emotionally abused: No    Physically abused: No    Forced sexual activity: No  Other Topics Concern  . Not on file  Social History Narrative   No regular execise.     Family History  Problem Relation Age of Onset  . Diabetes Other     Outpatient Encounter Medications as of 05/21/2018  Medication Sig  . amphetamine-dextroamphetamine (ADDERALL) 20 MG tablet Take 1 tablet (20 mg total) by mouth 2 (two) times daily.  Marland Kitchen  levonorgestrel (MIRENA) 20 MCG/24HR IUD 1 each by Intrauterine route once.  . meclizine (ANTIVERT) 25 MG tablet Take 1 tablet (25 mg total) by mouth 3 (three) times daily as needed for dizziness.   No facility-administered encounter medications on file as of 05/21/2018.           Objective:   Physical Exam  Constitutional: She is oriented to person, place, and time. She appears well-developed and well-nourished.  HENT:  Head: Normocephalic and atraumatic.  Right Ear: External ear normal.  Left Ear: External ear normal.  Nose: Nose normal.  Mouth/Throat: Oropharynx is clear and moist.  TMs and canals are clear.   Eyes: Pupils are equal, round, and reactive to light. Conjunctivae and EOM are normal.  Neck: Neck supple. No thyromegaly present.  Cardiovascular: Normal rate, regular rhythm and normal heart sounds.  Pulmonary/Chest: Effort normal and breath sounds  normal. She has no wheezes.  Lymphadenopathy:    She has no cervical adenopathy.  Neurological: She is alert and oriented to person, place, and time. No cranial nerve deficit. Coordination normal.  Has a Dix-Hallpike maneuver to the right with horizontal nystagmus.  Skin: Skin is warm and dry.  Psychiatric: She has a normal mood and affect.       Assessment & Plan:  BPPV-discussed diagnosis.  Handout provided on home exercises today.  She will need to do them for the right side and reviewed those with her today.  Also sent her prescription for meclizine.  If she is not noticing significant symptom improvement in the next 2 to 3 days then please let us know and we can always refer her for more formal vestibular rehab.

## 2018-05-21 NOTE — Patient Instructions (Signed)

## 2018-06-24 ENCOUNTER — Ambulatory Visit: Payer: 59

## 2018-07-04 ENCOUNTER — Ambulatory Visit (INDEPENDENT_AMBULATORY_CARE_PROVIDER_SITE_OTHER): Payer: 59 | Admitting: Family Medicine

## 2018-07-04 ENCOUNTER — Encounter: Payer: Self-pay | Admitting: Family Medicine

## 2018-07-04 VITALS — BP 110/71 | HR 82 | Ht 64.0 in | Wt 119.0 lb

## 2018-07-04 DIAGNOSIS — F988 Other specified behavioral and emotional disorders with onset usually occurring in childhood and adolescence: Secondary | ICD-10-CM | POA: Diagnosis not present

## 2018-07-04 MED ORDER — AMPHETAMINE-DEXTROAMPHETAMINE 20 MG PO TABS: 20.0000 mg | ORAL_TABLET | Freq: Two times a day (BID) | ORAL | 0 refills | Status: DC

## 2018-07-04 NOTE — Progress Notes (Signed)
   Subjective:    Patient ID: Lauren Franklin, female    DOB: 12-Feb-1981, 37 y.o.   MRN: [id number]  HPI ADD - Reports symptoms are well controlled on current regime. Denies any problems with insomnia, chest pain, palpitations, or SOB.      Review of Systems     Objective:   Physical Exam  Constitutional: She is oriented to person, place, and time. She appears well-developed and well-nourished.  HENT:  Head: Normocephalic and atraumatic.  Cardiovascular: Normal rate, regular rhythm and normal heart sounds.  Pulmonary/Chest: Effort normal and breath sounds normal.  Neurological: She is alert and oriented to person, place, and time.  Skin: Skin is warm and dry.  Psychiatric: She has a normal mood and affect. Her behavior is normal.          Assessment & Plan:  ADD -with current regimen.  Refill sent to pharmacy.  Follow-up in 4 months call if any problems or side effects or concerns.  Blood pressure at goal.

## 2018-07-11 MED FILL — DEXTROAMP-AMPHETAMIN 20 MG: 20 | 90 days supply | Qty: 180 | Fill #0

## 2018-07-26 ENCOUNTER — Ambulatory Visit: Payer: 59 | Admitting: Sports Medicine

## 2018-07-26 ENCOUNTER — Ambulatory Visit (INDEPENDENT_AMBULATORY_CARE_PROVIDER_SITE_OTHER): Payer: 59 | Admitting: Physician Assistant

## 2018-07-26 VITALS — BP 126/73 | HR 103 | Temp 98.1°F | Wt 118.0 lb

## 2018-07-26 DIAGNOSIS — R300 Dysuria: Secondary | ICD-10-CM

## 2018-07-26 DIAGNOSIS — N3001 Acute cystitis with hematuria: Secondary | ICD-10-CM

## 2018-07-26 LAB — POCT URINALYSIS DIPSTICK
Glucose, UA: NEGATIVE
KETONES UA: NEGATIVE
Nitrite, UA: NEGATIVE
PH UA: 5.5 (ref 5.0–8.0)
Protein, UA: NEGATIVE
UROBILINOGEN UA: 0.2 U/dL

## 2018-07-26 MED ORDER — NITROFURANTOIN MONOHYD MACRO 100 MG PO CAPS: 100.0000 mg | ORAL_CAPSULE | Freq: Two times a day (BID) | ORAL | 0 refills | Status: AC

## 2018-07-26 MED FILL — NITROFURANTOIN MONO-MCR 100: 100 | 7 days supply | Qty: 14 | Fill #0

## 2018-07-26 NOTE — Progress Notes (Signed)
Pt in today for a nurse visit for possible UTI. Pt states symptoms started yesterday, denies fever, has some lower back pain. UA was collected and results  Entered. Per provider Macrobid was sent to pharmacy and culture sent to lab.Pharmacy verified.  .. Results for orders placed or performed in visit on 07/26/18  POCT Urinalysis Dipstick  Result Value Ref Range   Color, UA yellow    Clarity, UA clear    Glucose, UA Negative Negative   Bilirubin, UA small    Ketones, UA neg    Spec Grav, UA >=1.030 (A) 1.010 - 1.025   Blood, UA moderate    pH, UA 5.5 5.0 - 8.0   Protein, UA Negative Negative   Urobilinogen, UA 0.2 0.2 or 1.0 E.U./dL   Nitrite, UA neg    Leukocytes, UA Large (3+) (A) Negative   Appearance     Odor     Agree with above plan. Iran Planas PA-C

## 2018-07-28 LAB — URINE CULTURE
MICRO NUMBER:: 91463952
SPECIMEN QUALITY:: ADEQUATE

## 2018-07-29 NOTE — Progress Notes (Signed)
Call pt: confirmed infection with culture. macrobid should be treating it well. Hopefully feeling much better. Finish all abx.

## 2018-08-19 ENCOUNTER — Ambulatory Visit: Payer: 59

## 2018-10-14 ENCOUNTER — Other Ambulatory Visit: Payer: Self-pay

## 2018-10-14 DIAGNOSIS — F988 Other specified behavioral and emotional disorders with onset usually occurring in childhood and adolescence: Secondary | ICD-10-CM

## 2018-10-14 MED ORDER — AMPHETAMINE-DEXTROAMPHETAMINE 20 MG PO TABS: 20.0000 mg | ORAL_TABLET | Freq: Two times a day (BID) | ORAL | 0 refills | Status: DC

## 2018-10-14 MED FILL — AMPHETAMINE-DEXTROAMPHETAMI: 20 | 90 days supply | Qty: 180 | Fill #0

## 2018-10-14 NOTE — Telephone Encounter (Signed)
Request a refill on adderall. She has appointment this week.

## 2018-10-16 ENCOUNTER — Encounter: Payer: Self-pay | Admitting: Family Medicine

## 2018-10-16 ENCOUNTER — Ambulatory Visit (INDEPENDENT_AMBULATORY_CARE_PROVIDER_SITE_OTHER): Payer: No Typology Code available for payment source | Admitting: Family Medicine

## 2018-10-16 VITALS — BP 100/61 | HR 81 | Ht 64.0 in | Wt 121.0 lb

## 2018-10-16 DIAGNOSIS — Z0000 Encounter for general adult medical examination without abnormal findings: Secondary | ICD-10-CM | POA: Diagnosis not present

## 2018-10-16 NOTE — Patient Instructions (Addendum)
Remember to schedule your Pap smear.  Health Maintenance, Female Adopting a healthy lifestyle and getting preventive care can go a long way to promote health and wellness. Talk with your health care provider about what schedule of regular examinations is right for you. This is a good chance for you to check in with your provider about disease prevention and staying healthy. In between checkups, there are plenty of things you can do on your own. Experts have done a lot of research about which lifestyle changes and preventive measures are most likely to keep you healthy. Ask your health care provider for more information. Weight and diet Eat a healthy diet  Be sure to include plenty of vegetables, fruits, low-fat dairy products, and lean protein.  Do not eat a lot of foods high in solid fats, added sugars, or salt.  Get regular exercise. This is one of the most important things you can do for your health. ? Most adults should exercise for at least 150 minutes each week. The exercise should increase your heart rate and make you sweat (moderate-intensity exercise). ? Most adults should also do strengthening exercises at least twice a week. This is in addition to the moderate-intensity exercise. Maintain a healthy weight  Body mass index (BMI) is a measurement that can be used to identify possible weight problems. It estimates body fat based on height and weight. Your health care provider can help determine your BMI and help you achieve or maintain a healthy weight.  For females 3 years of age and older: ? A BMI below 18.5 is considered underweight. ? A BMI of 18.5 to 24.9 is normal. ? A BMI of 25 to 29.9 is considered overweight. ? A BMI of 30 and above is considered obese. Watch levels of cholesterol and blood lipids  You should start having your blood tested for lipids and cholesterol at 38 years of age, then have this test every 5 years.  You may need to have your cholesterol levels  checked more often if: ? Your lipid or cholesterol levels are high. ? You are older than 38 years of age. ? You are at high risk for heart disease. Cancer screening Lung Cancer  Lung cancer screening is recommended for adults 58-65 years old who are at high risk for lung cancer because of a history of smoking.  A yearly low-dose CT scan of the lungs is recommended for people who: ? Currently smoke. ? Have quit within the past 15 years. ? Have at least a 30-pack-year history of smoking. A pack year is smoking an average of one pack of cigarettes a day for 1 year.  Yearly screening should continue until it has been 15 years since you quit.  Yearly screening should stop if you develop a health problem that would prevent you from having lung cancer treatment. Breast Cancer  Practice breast self-awareness. This means understanding how your breasts normally appear and feel.  It also means doing regular breast self-exams. Let your health care provider know about any changes, no matter how small.  If you are in your 20s or 30s, you should have a clinical breast exam (CBE) by a health care provider every 1-3 years as part of a regular health exam.  If you are 53 or older, have a CBE every year. Also consider having a breast X-ray (mammogram) every year.  If you have a family history of breast cancer, talk to your health care provider about genetic screening.  If you are  at high risk for breast cancer, talk to your health care provider about having an MRI and a mammogram every year.  Breast cancer gene (BRCA) assessment is recommended for women who have family members with BRCA-related cancers. BRCA-related cancers include: ? Breast. ? Ovarian. ? Tubal. ? Peritoneal cancers.  Results of the assessment will determine the need for genetic counseling and BRCA1 and BRCA2 testing. Cervical Cancer Your health care provider may recommend that you be screened regularly for cancer of the pelvic  organs (ovaries, uterus, and vagina). This screening involves a pelvic examination, including checking for microscopic changes to the surface of your cervix (Pap test). You may be encouraged to have this screening done every 3 years, beginning at age 26.  For women ages 20-65, health care providers may recommend pelvic exams and Pap testing every 3 years, or they may recommend the Pap and pelvic exam, combined with testing for human papilloma virus (HPV), every 5 years. Some types of HPV increase your risk of cervical cancer. Testing for HPV may also be done on women of any age with unclear Pap test results.  Other health care providers may not recommend any screening for nonpregnant women who are considered low risk for pelvic cancer and who do not have symptoms. Ask your health care provider if a screening pelvic exam is right for you.  If you have had past treatment for cervical cancer or a condition that could lead to cancer, you need Pap tests and screening for cancer for at least 20 years after your treatment. If Pap tests have been discontinued, your risk factors (such as having a new sexual partner) need to be reassessed to determine if screening should resume. Some women have medical problems that increase the chance of getting cervical cancer. In these cases, your health care provider may recommend more frequent screening and Pap tests. Colorectal Cancer  This type of cancer can be detected and often prevented.  Routine colorectal cancer screening usually begins at 38 years of age and continues through 38 years of age.  Your health care provider may recommend screening at an earlier age if you have risk factors for colon cancer.  Your health care provider may also recommend using home test kits to check for hidden blood in the stool.  A small camera at the end of a tube can be used to examine your colon directly (sigmoidoscopy or colonoscopy). This is done to check for the earliest forms  of colorectal cancer.  Routine screening usually begins at age 56.  Direct examination of the colon should be repeated every 5-10 years through 38 years of age. However, you may need to be screened more often if early forms of precancerous polyps or small growths are found. Skin Cancer  Check your skin from head to toe regularly.  Tell your health care provider about any new moles or changes in moles, especially if there is a change in a mole's shape or color.  Also tell your health care provider if you have a mole that is larger than the size of a pencil eraser.  Always use sunscreen. Apply sunscreen liberally and repeatedly throughout the day.  Protect yourself by wearing long sleeves, pants, a wide-brimmed hat, and sunglasses whenever you are outside. Heart disease, diabetes, and high blood pressure  High blood pressure causes heart disease and increases the risk of stroke. High blood pressure is more likely to develop in: ? People who have blood pressure in the high end of the  normal range (130-139/85-89 mm Hg). ? People who are overweight or obese. ? People who are African American.  If you are 18-39 years of age, have your blood pressure checked every 3-5 years. If you are 40 years of age or older, have your blood pressure checked every year. You should have your blood pressure measured twice-once when you are at a hospital or clinic, and once when you are not at a hospital or clinic. Record the average of the two measurements. To check your blood pressure when you are not at a hospital or clinic, you can use: ? An automated blood pressure machine at a pharmacy. ? A home blood pressure monitor.  If you are between 55 years and 79 years old, ask your health care provider if you should take aspirin to prevent strokes.  Have regular diabetes screenings. This involves taking a blood sample to check your fasting blood sugar level. ? If you are at a normal weight and have a low risk for  diabetes, have this test once every three years after 38 years of age. ? If you are overweight and have a high risk for diabetes, consider being tested at a younger age or more often. Preventing infection Hepatitis B  If you have a higher risk for hepatitis B, you should be screened for this virus. You are considered at high risk for hepatitis B if: ? You were born in a country where hepatitis B is common. Ask your health care provider which countries are considered high risk. ? Your parents were born in a high-risk country, and you have not been immunized against hepatitis B (hepatitis B vaccine). ? You have HIV or AIDS. ? You use needles to inject street drugs. ? You live with someone who has hepatitis B. ? You have had sex with someone who has hepatitis B. ? You get hemodialysis treatment. ? You take certain medicines for conditions, including cancer, organ transplantation, and autoimmune conditions. Hepatitis C  Blood testing is recommended for: ? Everyone born from 1945 through 1965. ? Anyone with known risk factors for hepatitis C. Sexually transmitted infections (STIs)  You should be screened for sexually transmitted infections (STIs) including gonorrhea and chlamydia if: ? You are sexually active and are younger than 38 years of age. ? You are older than 38 years of age and your health care provider tells you that you are at risk for this type of infection. ? Your sexual activity has changed since you were last screened and you are at an increased risk for chlamydia or gonorrhea. Ask your health care provider if you are at risk.  If you do not have HIV, but are at risk, it may be recommended that you take a prescription medicine daily to prevent HIV infection. This is called pre-exposure prophylaxis (PrEP). You are considered at risk if: ? You are sexually active and do not regularly use condoms or know the HIV status of your partner(s). ? You take drugs by injection. ? You are  sexually active with a partner who has HIV. Talk with your health care provider about whether you are at high risk of being infected with HIV. If you choose to begin PrEP, you should first be tested for HIV. You should then be tested every 3 months for as long as you are taking PrEP. Pregnancy  If you are premenopausal and you may become pregnant, ask your health care provider about preconception counseling.  If you may become pregnant, take 400 to   800 micrograms (mcg) of folic acid every day.  If you want to prevent pregnancy, talk to your health care provider about birth control (contraception). Osteoporosis and menopause  Osteoporosis is a disease in which the bones lose minerals and strength with aging. This can result in serious bone fractures. Your risk for osteoporosis can be identified using a bone density scan.  If you are 51 years of age or older, or if you are at risk for osteoporosis and fractures, ask your health care provider if you should be screened.  Ask your health care provider whether you should take a calcium or vitamin D supplement to lower your risk for osteoporosis.  Menopause may have certain physical symptoms and risks.  Hormone replacement therapy may reduce some of these symptoms and risks. Talk to your health care provider about whether hormone replacement therapy is right for you. Follow these instructions at home:  Schedule regular health, dental, and eye exams.  Stay current with your immunizations.  Do not use any tobacco products including cigarettes, chewing tobacco, or electronic cigarettes.  If you are pregnant, do not drink alcohol.  If you are breastfeeding, limit how much and how often you drink alcohol.  Limit alcohol intake to no more than 1 drink per day for nonpregnant women. One drink equals 12 ounces of beer, 5 ounces of wine, or 1 ounces of hard liquor.  Do not use street drugs.  Do not share needles.  Ask your health care  provider for help if you need support or information about quitting drugs.  Tell your health care provider if you often feel depressed.  Tell your health care provider if you have ever been abused or do not feel safe at home. This information is not intended to replace advice given to you by your health care provider. Make sure you discuss any questions you have with your health care provider. Document Released: 02/20/2011 Document Revised: 01/13/2016 Document Reviewed: 05/11/2015 Elsevier Interactive Patient Education  2019 [employer].

## 2018-10-16 NOTE — Progress Notes (Signed)
Subjective:     Lauren Franklin is a 38 y.o. female and is here for a comprehensive physical exam. The patient reports no problems.  He has been exercising regularly.  She does continue to smoke a few cigarettes per day.  She is considering quitting.  She feels like she sleeps well.  She currently has a Mirena IUD in.  She is planning on scheduling her Pap smear with OB/GYN down the hall.  Social History   Socioeconomic History  . Marital status: Married    Spouse name: Lauren Franklin  . Number of children: Not on file  . Years of education: Not on file  . Highest education level: Not on file  Occupational History  . Occupation: Dispensing optician: [employer]  . Financial resource strain: Not on file  . Food insecurity:    Worry: Never true    Inability: Never true  . Transportation needs:    Medical: No    Non-medical: No  Tobacco Use  . Smoking status: Current Some Day Smoker    Types: Cigarettes  . Smokeless tobacco: Never Used  . Tobacco comment: 3 cig per day. Doesnt smoke at home.   Substance and Sexual Activity  . Alcohol use: Yes    Alcohol/week: 0.0 standard drinks  . Drug use: No  . Sexual activity: Yes    Partners: Male  Lifestyle  . Physical activity:    Days per week: 1 day    Minutes per session: 10 min  . Stress: Only a little  Relationships  . Social connections:    Talks on phone: Once a week    Gets together: Once a week    Attends religious service: More than 4 times per year    Active member of club or organization: No    Attends meetings of clubs or organizations: Not on file    Relationship status: Married  . Intimate partner violence:    Fear of current or ex partner: No    Emotionally abused: No    Physically abused: No    Forced sexual activity: No  Other Topics Concern  . Not on file  Social History Narrative   No regular execise.    Health Maintenance  Topic Date Due  . PAP SMEAR-Modifier  02/03/2018  .  HIV Screening  07/05/2019 (Originally 01/01/1996)  . TETANUS/TDAP  07/17/2023  . INFLUENZA VACCINE  Completed    The following portions of the patient's history were reviewed and updated as appropriate: allergies, current medications, past family history, past medical history, past social history, past surgical history and problem list.  Review of Systems A comprehensive review of systems was negative.   Objective:    BP 100/61   Pulse 81   Ht 5\' 4"  (1.626 m)   Wt 121 lb (54.9 kg)   SpO2 99%   BMI 20.77 kg/m  General appearance: alert, cooperative and appears stated age Head: Normocephalic, without obvious abnormality, atraumatic Eyes: conj clear, EOMI, PEERLA Ears: normal TM's and external ear canals both ears Nose: Nares normal. Septum midline. Mucosa normal. No drainage or sinus tenderness. Throat: lips, mucosa, and tongue normal; teeth and gums normal Neck: no adenopathy, no carotid bruit, no JVD, supple, symmetrical, trachea midline and thyroid not enlarged, symmetric, no tenderness/mass/nodules Back: symmetric, no curvature. ROM normal. No CVA tenderness. Lungs: clear to auscultation bilaterally Heart: regular rate and rhythm, S1, S2 normal, no murmur, click, rub or gallop Abdomen:  soft, non-tender; bowel sounds normal; no masses,  no organomegaly Extremities: extremities normal, atraumatic, no cyanosis or edema Pulses: 2+ and symmetric Skin: Skin color, texture, turgor normal. No rashes or lesions Lymph nodes: Cervical adenopathy: nl and Supraclavicular adenopathy: nl Neurologic: Alert and oriented X 3, normal strength and tone. Normal symmetric reflexes. Normal coordination and gait    Assessment:    Healthy female exam.      Plan:     See After Visit Summary for Counseling Recommendations   Keep up a regular exercise program and make sure you are eating a healthy diet Try to eat 4 servings of dairy a day, or if you are lactose intolerant take a calcium with  vitamin D daily.  Your vaccines are up to date.

## 2018-10-18 LAB — CBC WITH DIFFERENTIAL/PLATELET
Absolute Monocytes: 772 cells/uL (ref 200–950)
Basophils Absolute: 42 cells/uL (ref 0–200)
Basophils Relative: 0.5 %
Eosinophils Absolute: 540 cells/uL — ABNORMAL HIGH (ref 15–500)
Eosinophils Relative: 6.5 %
HCT: 37.6 % (ref 35.0–45.0)
Hemoglobin: 12.8 g/dL (ref 11.7–15.5)
Lymphs Abs: 2158 cells/uL (ref 850–3900)
MCH: 31.9 pg (ref 27.0–33.0)
MCHC: 34 g/dL (ref 32.0–36.0)
MCV: 93.8 fL (ref 80.0–100.0)
MPV: 10.1 fL (ref 7.5–12.5)
Monocytes Relative: 9.3 %
Neutro Abs: 4789 cells/uL (ref 1500–7800)
Neutrophils Relative %: 57.7 %
PLATELETS: 335 10*3/uL (ref 140–400)
RBC: 4.01 10*6/uL (ref 3.80–5.10)
RDW: 12.3 % (ref 11.0–15.0)
Total Lymphocyte: 26 %
WBC: 8.3 10*3/uL (ref 3.8–10.8)

## 2018-10-18 LAB — LIPID PANEL
Cholesterol: 165 mg/dL (ref <200)
HDL: 57 mg/dL (ref >=50)
LDL Cholesterol (Calc): 93 mg/dL (calc)
Non-HDL Cholesterol (Calc): 108 mg/dL (calc) (ref <130)
TRIGLYCERIDES: 65 mg/dL (ref <150)
Total CHOL/HDL Ratio: 2.9 (calc) (ref <5.0)

## 2018-10-18 LAB — COMPLETE METABOLIC PANEL WITH GFR
AG Ratio: 1.5 (calc) (ref 1.0–2.5)
ALT: 11 U/L (ref 6–29)
AST: 17 U/L (ref 10–30)
Albumin: 4.3 g/dL (ref 3.6–5.1)
Alkaline phosphatase (APISO): 81 U/L (ref 31–125)
BUN: 13 mg/dL (ref 7–25)
CO2: 28 mmol/L (ref 20–32)
Calcium: 9.5 mg/dL (ref 8.6–10.2)
Chloride: 106 mmol/L (ref 98–110)
Creat: 0.78 mg/dL (ref 0.50–1.10)
GFR, EST AFRICAN AMERICAN: 113 mL/min/{1.73_m2} (ref >=60)
GFR, Est Non African American: 97 mL/min/{1.73_m2} (ref >=60)
Globulin: 2.9 g/dL (calc) (ref 1.9–3.7)
Glucose, Bld: 88 mg/dL (ref 65–99)
Potassium: 4.5 mmol/L (ref 3.5–5.3)
Sodium: 140 mmol/L (ref 135–146)
TOTAL PROTEIN: 7.2 g/dL (ref 6.1–8.1)
Total Bilirubin: 0.5 mg/dL (ref 0.2–1.2)

## 2018-11-09 ENCOUNTER — Telehealth: Payer: Self-pay | Admitting: Family Medicine

## 2018-11-09 MED ORDER — SULFAMETHOXAZOLE-TRIMETHOPRIM 800-160 MG PO TABS: 1.0000 | ORAL_TABLET | Freq: Two times a day (BID) | ORAL | 0 refills | Status: DC

## 2018-11-09 NOTE — Telephone Encounter (Signed)
Pt called after hours.  Having dysuria nd frequency x 24 hours.  No fever, chills or sweats. No blood in urine.   Asking for ABX to be called in . Sent over Bactrim after confirming allergies.  Call if not improving.

## 2018-11-25 ENCOUNTER — Ambulatory Visit: Payer: No Typology Code available for payment source | Admitting: Family Medicine

## 2019-01-16 NOTE — Progress Notes (Signed)
Established Patient Office Visit  Subjective:  Patient ID: Lauren Franklin, female    DOB: [birth year]  Age: 38 y.o. MRN: [id number]  CC:  Chief Complaint  Patient presents with  . ADHD    HPI Donise Woodle presents for ADD  ADD - Reports symptoms are well controlled on current regime. Denies any problems with insomnia, chest pain, palpitations, or SOB.     Past Medical History:  Diagnosis Date  . ADHD (attention deficit hyperactivity disorder)   . IUD    mirena  . Viral gastroenteritis 07/12/2015    Past Surgical History:  Procedure Laterality Date  . CHOLECYSTECTOMY  2000    Family History  Problem Relation Age of Onset  . Diabetes Other     Social History   Socioeconomic History  . Marital status: Married    Spouse name: Louie Casa  . Number of children: Not on file  . Years of education: Not on file  . Highest education level: Not on file  Occupational History  . 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 Financial resource strain: Not on file  . Food insecurity:    Worry: Never true    Inability: Never true  . Transportation needs:    Medical: No    Non-medical: No  Tobacco Use  . Smoking status: Current Some Day Smoker    Types: Cigarettes  . Smokeless tobacco: Never Used  . Tobacco comment: 3 cig per day. Doesnt smoke at home.   Substance and Sexual Activity  . Alcohol use: Yes    Alcohol/week: 0.0 standard drinks  . Drug use: No  . Sexual activity: Yes    Partners: Male  Lifestyle  . Physical activity:    Days per week: 1 day    Minutes per session: 10 min  . Stress: Only a little  Relationships  . Social connections:    Talks on phone: Once a week    Gets together: Once a week    Attends religious service: More than 4 times per year    Active member of club or organization: No    Attends meetings of clubs or organizations: Not on file    Relationship status: Married  . Intimate partner  violence:    Fear of current or ex partner: No    Emotionally abused: No    Physically abused: No    Forced sexual activity: No  Other Topics Concern  . Not on file  Social History Narrative   No regular execise.     Outpatient Medications Prior to Visit  Medication Sig Dispense Refill  . levonorgestrel (MIRENA) 20 MCG/24HR IUD 1 each by Intrauterine route once.    Marland Kitchen amphetamine-dextroamphetamine (ADDERALL) 20 MG tablet Take 1 tablet (20 mg total) by mouth 2 (two) times daily. 180 tablet 0  . sulfamethoxazole-trimethoprim (BACTRIM DS,SEPTRA DS) 800-160 MG tablet Take 1 tablet by mouth 2 (two) times daily. 6 tablet 0   No facility-administered medications prior to visit.     Allergies  Allergen Reactions  . Penicillins Itching    ROS Review of Systems    Objective:    Physical Exam  BP 117/87   Pulse (!) 117   Ht 5\' 4"  (1.626 m)   Wt 121 lb (54.9 kg)   SpO2 100%   BMI 20.77 kg/m  Wt Readings from Last 3 Encounters:  01/17/19 121 lb (54.9 kg)  10/16/18 121 lb (54.9 kg)  07/26/18 118 lb (53.5 kg)     Health Maintenance Due  Topic Date Due  . PAP SMEAR-Modifier  02/03/2018    There are no preventive care reminders to display for this patient.  Lab Results  Component Value Date   TSH 1.24 06/07/2016   Lab Results  Component Value Date   WBC 8.3 10/17/2018   HGB 12.8 10/17/2018   HCT 37.6 10/17/2018   MCV 93.8 10/17/2018   PLT 335 10/17/2018   Lab Results  Component Value Date   NA 140 10/17/2018   K 4.5 10/17/2018   CO2 28 10/17/2018   GLUCOSE 88 10/17/2018   BUN 13 10/17/2018   CREATININE 0.78 10/17/2018   BILITOT 0.5 10/17/2018   ALKPHOS 93 06/07/2016   AST 17 10/17/2018   ALT 11 10/17/2018   PROT 7.2 10/17/2018   ALBUMIN 4.5 06/07/2016   CALCIUM 9.5 10/17/2018   Lab Results  Component Value Date   CHOL 165 10/17/2018   Lab Results  Component Value Date   HDL 57 10/17/2018   Lab Results  Component Value Date   LDLCALC 93  10/17/2018   Lab Results  Component Value Date   TRIG 65 10/17/2018   Lab Results  Component Value Date   CHOLHDL 2.9 10/17/2018   No results found for: HGBA1C    Assessment & Plan:   Problem List Items Addressed This Visit      Other   ADD (attention deficit disorder) - Primary    : Asymptomatic.  Blood pressure under good control.  Prescription sent to pharmacy.  Follow-up in 6 months.      Relevant Medications   amphetamine-dextroamphetamine (ADDERALL) 20 MG tablet     Abnormal Pap smear-I did strongly encourage her to get back in with OB/GYN and she was actually supposed to be scheduled for a LEEP procedure but says she never went.  Initial work-up was almost 4 years ago.  Encouraged her to get back in with Dr. Hulan Fray.   Meds ordered this encounter  Medications  . amphetamine-dextroamphetamine (ADDERALL) 20 MG tablet    Sig: Take 1 tablet (20 mg total) by mouth 2 (two) times daily.    Dispense:  180 tablet    Refill:  0    Follow-up: Return in about 6 months (around 07/20/2019) for ADD.    Beatrice Lecher, MD

## 2019-01-17 ENCOUNTER — Other Ambulatory Visit: Payer: Self-pay

## 2019-01-17 ENCOUNTER — Encounter: Payer: Self-pay | Admitting: Family Medicine

## 2019-01-17 ENCOUNTER — Ambulatory Visit (INDEPENDENT_AMBULATORY_CARE_PROVIDER_SITE_OTHER): Payer: No Typology Code available for payment source | Admitting: Family Medicine

## 2019-01-17 VITALS — BP 117/87 | HR 117 | Ht 64.0 in | Wt 121.0 lb

## 2019-01-17 DIAGNOSIS — R87612 Low grade squamous intraepithelial lesion on cytologic smear of cervix (LGSIL): Secondary | ICD-10-CM

## 2019-01-17 DIAGNOSIS — F988 Other specified behavioral and emotional disorders with onset usually occurring in childhood and adolescence: Secondary | ICD-10-CM

## 2019-01-17 MED ORDER — AMPHETAMINE-DEXTROAMPHETAMINE 20 MG PO TABS: 20.0000 mg | ORAL_TABLET | Freq: Two times a day (BID) | ORAL | 0 refills | Status: DC

## 2019-01-17 MED FILL — AMPHETAMINE-DEXTROAMPHETAMI: 20 | 90 days supply | Qty: 180 | Fill #0

## 2019-01-17 NOTE — Assessment & Plan Note (Signed)
:   Asymptomatic.  Blood pressure under good control.  Prescription sent to pharmacy.  Follow-up in 6 months.

## 2019-03-05 ENCOUNTER — Telehealth: Payer: Self-pay

## 2019-03-05 ENCOUNTER — Encounter: Payer: Self-pay | Admitting: *Deleted

## 2019-03-05 NOTE — Telephone Encounter (Signed)
Spoke with patient today and advised her she needs to have a PAP. Last message from OBGYN was to follow up LEEP. She states she is going to wait until she needs her IUD replaced. Should she wait that long.     Tiernan, [place], RN     03/11/15 2:11 PM Note   Patient aware of pathology results of her colposcopy. Patient informed per Dr. Hulan Fray that she will need a LEEP- explained procedure to the patient. Patient will call back/ come by to scheduled appointment. Kathrene Alu RN BSN

## 2019-03-05 NOTE — Telephone Encounter (Signed)
I would not recommend waiting that long.  I would encourage her to get it done in the next 1 to 2 months if at all possible.

## 2019-03-05 NOTE — Telephone Encounter (Signed)
Patient advised.

## 2019-03-07 ENCOUNTER — Encounter: Payer: Self-pay | Admitting: Family Medicine

## 2019-03-17 ENCOUNTER — Ambulatory Visit (INDEPENDENT_AMBULATORY_CARE_PROVIDER_SITE_OTHER): Payer: No Typology Code available for payment source | Admitting: Family Medicine

## 2019-03-17 ENCOUNTER — Encounter: Payer: Self-pay | Admitting: Family Medicine

## 2019-03-17 DIAGNOSIS — S0591XA Unspecified injury of right eye and orbit, initial encounter: Secondary | ICD-10-CM

## 2019-03-17 DIAGNOSIS — H5711 Ocular pain, right eye: Secondary | ICD-10-CM

## 2019-03-17 MED ORDER — CIPROFLOXACIN HCL 0.3 % OP SOLN: OPHTHALMIC | 0 refills | Status: DC

## 2019-03-17 MED FILL — CIPROFLOXACIN HCL 0.3 % SOL: 0.3 | 7 days supply | Qty: 5 | Fill #0

## 2019-03-17 NOTE — Progress Notes (Signed)
Acute Office Visit  Subjective:    Patient ID: Lauren Franklin, female    DOB: [birth year], 38 y.o.   MRN: [id number]  Chief Complaint  Patient presents with  . Eye Pain    R eye    HPI Patient is in today for right eye pain that started yesterday.  She says her dog was playing and jumped up and dog's paw hit her right eye.  She says it was quite painful initially she has been rinsing it with saline.  He says when she woke up this morning it was crusted shut and she had to clean it.  She has not had any significant vision change or vision loss.  Past Medical History:  Diagnosis Date  . ADHD (attention deficit hyperactivity disorder)   . IUD    mirena  . Viral gastroenteritis 07/12/2015    Past Surgical History:  Procedure Laterality Date  . CHOLECYSTECTOMY  2000    Family History  Problem Relation Age of Onset  . Diabetes Other     Social History   Socioeconomic History  . Marital status: Married    Spouse name: Louie Casa  . Number of children: Not on file  . Years of education: Not on file  . Highest education level: Not on file  Occupational History  . Occupation: Dispensing optician: [employer]  . Financial resource strain: Not on file  . Food insecurity    Worry: Never true    Inability: Never true  . Transportation needs    Medical: No    Non-medical: No  Tobacco Use  . Smoking status: Current Some Day Smoker    Types: Cigarettes  . Smokeless tobacco: Never Used  . Tobacco comment: 3 cig per day. Doesnt smoke at home.   Substance and Sexual Activity  . Alcohol use: Yes    Alcohol/week: 0.0 standard drinks  . Drug use: No  . Sexual activity: Yes    Partners: Male  Lifestyle  . Physical activity    Days per week: 1 day    Minutes per session: 10 min  . Stress: Only a little  Relationships  . Social Herbalist on phone: Once a week    Gets together: Once a week    Attends religious service: More than  4 times per year    Active member of club or organization: No    Attends meetings of clubs or organizations: Not on file    Relationship status: Married  . Intimate partner violence    Fear of current or ex partner: No    Emotionally abused: No    Physically abused: No    Forced sexual activity: No  Other Topics Concern  . Not on file  Social History Narrative   No regular execise.     Outpatient Medications Prior to Visit  Medication Sig Dispense Refill  . amphetamine-dextroamphetamine (ADDERALL) 20 MG tablet Take 1 tablet (20 mg total) by mouth 2 (two) times daily. 180 tablet 0  . levonorgestrel (MIRENA) 20 MCG/24HR IUD 1 each by Intrauterine route once.     No facility-administered medications prior to visit.     Allergies  Allergen Reactions  . Penicillins Itching    ROS     Objective:    Physical Exam  Constitutional: She is oriented to person, place, and time. She appears well-developed and well-nourished.  HENT:  Head: Normocephalic and atraumatic.  Eyes: Pupils  are equal, round, and reactive to light. EOM are normal. Right eye exhibits no discharge. Left eye exhibits no discharge.  On the right eye the medial corner of the sclera is injected.  There is some mild lid swelling.  No significant erythema over the lid.  Extraocular movements are intact.  Pupils equal round and reactive to light.  No current discharge in the corners of the eye.  I did apply numbing drop to the eye and fluorescein stain.  I did not observe any lacerations or ulcerations were under fluorescein stain.  Cardiovascular: Normal rate.  Pulmonary/Chest: Effort normal.  Neurological: She is alert and oriented to person, place, and time.  Skin: Skin is dry. No pallor.  Psychiatric: She has a normal mood and affect. Her behavior is normal.  Vitals reviewed.   There were no vitals taken for this visit. Wt Readings from Last 3 Encounters:  01/17/19 121 lb (54.9 kg)  10/16/18 121 lb (54.9 kg)   07/26/18 118 lb (53.5 kg)    Health Maintenance Due  Topic Date Due  . PAP SMEAR-Modifier  02/03/2018    There are no preventive care reminders to display for this patient.   Lab Results  Component Value Date   TSH 1.24 06/07/2016   Lab Results  Component Value Date   WBC 8.3 10/17/2018   HGB 12.8 10/17/2018   HCT 37.6 10/17/2018   MCV 93.8 10/17/2018   PLT 335 10/17/2018   Lab Results  Component Value Date   NA 140 10/17/2018   K 4.5 10/17/2018   CO2 28 10/17/2018   GLUCOSE 88 10/17/2018   BUN 13 10/17/2018   CREATININE 0.78 10/17/2018   BILITOT 0.5 10/17/2018   ALKPHOS 93 06/07/2016   AST 17 10/17/2018   ALT 11 10/17/2018   PROT 7.2 10/17/2018   ALBUMIN 4.5 06/07/2016   CALCIUM 9.5 10/17/2018   Lab Results  Component Value Date   CHOL 165 10/17/2018   Lab Results  Component Value Date   HDL 57 10/17/2018   Lab Results  Component Value Date   LDLCALC 93 10/17/2018   Lab Results  Component Value Date   TRIG 65 10/17/2018   Lab Results  Component Value Date   CHOLHDL 2.9 10/17/2018   No results found for: HGBA1C     Assessment & Plan:   Problem List Items Addressed This Visit    None    Visit Diagnoses    Acute right eye pain    -  Primary   Right eye injury, initial encounter         Acute right injury-I am concerned because she is having persistent pain and some crusting this morning so go ahead and place her on Cipro eyedrops for the next 5 days.  If not improving or suddenly getting worse or swelling or fever develops please let me know immediately.  If discharge becomes more purulent please let us know immediately. Vision test performed- normal.    Meds ordered this encounter  Medications  . ciprofloxacin (CILOXAN) 0.3 % ophthalmic solution    Sig: Administer 1 drop into left eye, every 2 hours, while awake, for 2 days. Then 1 drop, every 4 hours, while awake, for the next 5 days.    Dispense:  5 mL    Refill:  0     Beatrice Lecher, MD

## 2019-03-20 ENCOUNTER — Ambulatory Visit (INDEPENDENT_AMBULATORY_CARE_PROVIDER_SITE_OTHER): Payer: No Typology Code available for payment source | Admitting: Obstetrics & Gynecology

## 2019-03-20 ENCOUNTER — Encounter: Payer: Self-pay | Admitting: Obstetrics & Gynecology

## 2019-03-20 ENCOUNTER — Other Ambulatory Visit: Payer: Self-pay

## 2019-03-20 ENCOUNTER — Encounter: Payer: No Typology Code available for payment source | Admitting: Obstetrics & Gynecology

## 2019-03-20 VITALS — BP 105/73 | HR 94 | Resp 16 | Ht 65.0 in | Wt 123.0 lb

## 2019-03-20 DIAGNOSIS — Z01419 Encounter for gynecological examination (general) (routine) without abnormal findings: Secondary | ICD-10-CM

## 2019-03-20 DIAGNOSIS — Z1151 Encounter for screening for human papillomavirus (HPV): Secondary | ICD-10-CM

## 2019-03-20 DIAGNOSIS — Z23 Encounter for immunization: Secondary | ICD-10-CM | POA: Diagnosis not present

## 2019-03-20 DIAGNOSIS — Z124 Encounter for screening for malignant neoplasm of cervix: Secondary | ICD-10-CM

## 2019-03-20 LAB — RESULTS CONSOLE HPV: CHL HPV: NEGATIVE

## 2019-03-20 NOTE — Progress Notes (Signed)
Subjective:    Lauren Franklin is a 38 y.o. married P4 (21, 71, 38, and 72 yo kids) female who presents for an annual exam. The patient has no complaints today. The patient is sexually active. 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 The patient wears seatbelts: yes. The patient participates in regular exercise: yes. (kickboxig) Has the patient ever been transfused or tattooed?: yes. The patient reports that there is not domestic violence in her life.   Menstrual History: OB History    Gravida  4   Para  4   Term  4   Preterm      AB      Living        SAB      TAB      Ectopic      Multiple      Live Births              Menarche age: 77 No LMP recorded. (Menstrual status: IUD).    The following portions of the patient's history were reviewed and updated as appropriate: allergies, current medications, past family history, past medical history, past social history, past surgical history and problem list.  Review of Systems Pertinent items are noted in HPI.   FH- no breast cancer, + cervical cancer in maternal GM, no colon cancer Works for [employer] for 6 years She had Mirena placed in 2016, no periods    Objective:    BP 105/73   Pulse 94   Resp 16   Ht 5\' 5"  (1.651 m)   Wt 123 lb (55.8 kg)   BMI 20.47 kg/m   General Appearance:    Alert, cooperative, no distress, appears stated age  Head:    Normocephalic, without obvious abnormality, atraumatic  Eyes:    PERRL, conjunctiva/corneas clear, EOM's intact, fundi    benign, both eyes  Ears:    Normal TM's and external ear canals, both ears  Nose:   Nares normal, septum midline, mucosa normal, no drainage    or sinus tenderness  Throat:   Lips, mucosa, and tongue normal; teeth and gums normal  Neck:   Supple, symmetrical, trachea midline, no adenopathy;    thyroid:  no enlargement/tenderness/nodules; no carotid   bruit or JVD  Back:     Symmetric, no curvature, ROM normal, no CVA  tenderness  Lungs:     Clear to auscultation bilaterally, respirations unlabored  Chest Wall:    No tenderness or deformity   Heart:    Regular rate and rhythm, S1 and S2 normal, no murmur, rub   or gallop  Breast Exam:    No tenderness, masses, or nipple abnormality  Abdomen:     Soft, non-tender, bowel sounds active all four quadrants,    no masses, no organomegaly  Genitalia:    Normal female without lesion, discharge or tenderness, IUD string seen, normal size and shape, retroverted, mobile, non-tender, normal adnexal exam      Extremities:   Extremities normal, atraumatic, no cyanosis or edema  Pulses:   2+ and symmetric all extremities  Skin:   Skin color, texture, turgor normal, no rashes or lesions  Lymph nodes:   Cervical, supraclavicular, and axillary nodes normal  Neurologic:   CNII-XII intact, normal strength, sensation and reflexes    throughout  .    Assessment:    Healthy female exam.    Plan:     Thin prep Pap smear. with cotesting Schedule colpo Start  Gardasil

## 2019-03-25 LAB — CYTOLOGY - PAP
Adequacy: ABSENT
Diagnosis: NEGATIVE
HPV: NOT DETECTED

## 2019-03-27 ENCOUNTER — Encounter: Payer: Self-pay | Admitting: *Deleted

## 2019-04-14 ENCOUNTER — Encounter: Payer: No Typology Code available for payment source | Admitting: Obstetrics & Gynecology

## 2019-04-22 ENCOUNTER — Ambulatory Visit (INDEPENDENT_AMBULATORY_CARE_PROVIDER_SITE_OTHER): Payer: No Typology Code available for payment source | Admitting: Family Medicine

## 2019-04-22 ENCOUNTER — Encounter: Payer: Self-pay | Admitting: Family Medicine

## 2019-04-22 DIAGNOSIS — F988 Other specified behavioral and emotional disorders with onset usually occurring in childhood and adolescence: Secondary | ICD-10-CM

## 2019-04-22 MED ORDER — AMPHETAMINE-DEXTROAMPHETAMINE 20 MG PO TABS: 20.0000 mg | ORAL_TABLET | Freq: Two times a day (BID) | ORAL | 0 refills | Status: DC

## 2019-04-22 MED FILL — AMPHETAMINE-DEXTROAMPHETAMI: 20 | 90 days supply | Qty: 180 | Fill #0

## 2019-04-22 NOTE — Assessment & Plan Note (Signed)
Well controlled. Continue current regimen. Follow up in  6 mo  

## 2019-04-22 NOTE — Progress Notes (Signed)
Established Patient Office Visit  Subjective:  Patient ID: Lauren Franklin, female    DOB: [birth year]  Age: 38 y.o. MRN: [id number]  CC:  Chief Complaint  Patient presents with  . ADD    HPI Lauren Franklin presents for   ADD - Reports symptoms are well controlled on current regime. Denies any problems with insomnia, chest pain, palpitations, or SOB.  She is happy with her current regimen.  She feels like it is effective and tolerates it well.    Past Medical History:  Diagnosis Date  . ADD (attention deficit disorder)   . ADHD (attention deficit hyperactivity disorder)   . IUD    mirena  . Vaginal Pap smear, abnormal   . Viral gastroenteritis 07/12/2015    Past Surgical History:  Procedure Laterality Date  . CHOLECYSTECTOMY  2000    Family History  Problem Relation Age of Onset  . Diabetes Other     Social History   Socioeconomic History  . Marital status: Married    Spouse name: Louie Casa  . Number of children: Not on file  . Years of education: Not on file  . Highest education level: Not on file  Occupational History  . 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 Financial resource strain: Not on file  . Food insecurity    Worry: Never true    Inability: Never true  . Transportation needs    Medical: No    Non-medical: No  Tobacco Use  . Smoking status: Current Some Day Smoker    Types: Cigarettes  . Smokeless tobacco: Never Used  . Tobacco comment: 3 cig per day. Doesnt smoke at home.   Substance and Sexual Activity  . Alcohol use: Yes    Alcohol/week: 0.0 standard drinks  . Drug use: No  . Sexual activity: Yes    Partners: Male    Birth control/protection: I.U.D.  Lifestyle  . Physical activity    Days per week: 1 day    Minutes per session: 10 min  . Stress: Only a little  Relationships  . Social Herbalist on phone: Once a week    Gets together: Once a week    Attends religious  service: More than 4 times per year    Active member of club or organization: No    Attends meetings of clubs or organizations: Not on file    Relationship status: Married  . Intimate partner violence    Fear of current or ex partner: No    Emotionally abused: No    Physically abused: No    Forced sexual activity: No  Other Topics Concern  . Not on file  Social History Narrative   No regular execise.     Outpatient Medications Prior to Visit  Medication Sig Dispense Refill  . levonorgestrel (MIRENA) 20 MCG/24HR IUD 1 each by Intrauterine route once.    Marland Kitchen amphetamine-dextroamphetamine (ADDERALL) 20 MG tablet Take 1 tablet (20 mg total) by mouth 2 (two) times daily. 180 tablet 0   No facility-administered medications prior to visit.     Allergies  Allergen Reactions  . Penicillins Itching    ROS Review of Systems    Objective:    Physical Exam  Constitutional: She is oriented to person, place, and time. She appears well-developed and well-nourished.  HENT:  Head: Normocephalic and atraumatic.  Cardiovascular: Normal rate, regular rhythm and normal heart sounds.  Pulmonary/Chest:  Effort normal and breath sounds normal.  Neurological: She is alert and oriented to person, place, and time.  Skin: Skin is warm and dry. Pallor:       Psychiatric: She has a normal mood and affect. Her behavior is normal.    BP 104/74   Pulse 99   Ht 5\' 5"  (1.651 m)   Wt 126 lb (57.2 kg)   SpO2 100%   BMI 20.97 kg/m  Wt Readings from Last 3 Encounters:  04/22/19 126 lb (57.2 kg)  03/20/19 123 lb (55.8 kg)  01/17/19 121 lb (54.9 kg)     There are no preventive care reminders to display for this patient.  There are no preventive care reminders to display for this patient.  Lab Results  Component Value Date   TSH 1.24 06/07/2016   Lab Results  Component Value Date   WBC 8.3 10/17/2018   HGB 12.8 10/17/2018   HCT 37.6 10/17/2018   MCV 93.8 10/17/2018   PLT 335 10/17/2018    Lab Results  Component Value Date   NA 140 10/17/2018   K 4.5 10/17/2018   CO2 28 10/17/2018   GLUCOSE 88 10/17/2018   BUN 13 10/17/2018   CREATININE 0.78 10/17/2018   BILITOT 0.5 10/17/2018   ALKPHOS 93 06/07/2016   AST 17 10/17/2018   ALT 11 10/17/2018   PROT 7.2 10/17/2018   ALBUMIN 4.5 06/07/2016   CALCIUM 9.5 10/17/2018   Lab Results  Component Value Date   CHOL 165 10/17/2018   Lab Results  Component Value Date   HDL 57 10/17/2018   Lab Results  Component Value Date   LDLCALC 93 10/17/2018   Lab Results  Component Value Date   TRIG 65 10/17/2018   Lab Results  Component Value Date   CHOLHDL 2.9 10/17/2018   No results found for: HGBA1C    Assessment & Plan:   Problem List Items Addressed This Visit      Other   ADD (attention deficit disorder)    Well controlled. Continue current regimen. Follow up in  6 mo.       Relevant Medications   amphetamine-dextroamphetamine (ADDERALL) 20 MG tablet      Meds ordered this encounter  Medications  . amphetamine-dextroamphetamine (ADDERALL) 20 MG tablet    Sig: Take 1 tablet (20 mg total) by mouth 2 (two) times daily.    Dispense:  180 tablet    Refill:  0    Follow-up: No follow-ups on file.    Beatrice Lecher, MD

## 2019-05-21 ENCOUNTER — Ambulatory Visit: Payer: No Typology Code available for payment source

## 2019-06-02 ENCOUNTER — Ambulatory Visit (INDEPENDENT_AMBULATORY_CARE_PROVIDER_SITE_OTHER): Payer: No Typology Code available for payment source | Admitting: Family Medicine

## 2019-06-02 DIAGNOSIS — Z23 Encounter for immunization: Secondary | ICD-10-CM | POA: Diagnosis not present

## 2019-06-02 NOTE — Progress Notes (Signed)
Agree with documentation as above.   Mattheus Rauls, MD  

## 2019-06-02 NOTE — Progress Notes (Signed)
Established Patient Office Visit  Subjective:  Patient ID: Lauren Franklin, female    DOB: Sep 16, 1980  Age: 38 y.o. MRN: [id number]  CC: No chief complaint on file.   HPI Lauren Franklin presents for 2nd HPV.   Past Medical History:  Diagnosis Date  . ADD (attention deficit disorder)   . ADHD (attention deficit hyperactivity disorder)   . IUD    mirena  . Vaginal Pap smear, abnormal   . Viral gastroenteritis 07/12/2015    Past Surgical History:  Procedure Laterality Date  . CHOLECYSTECTOMY  2000    Family History  Problem Relation Age of Onset  . Diabetes Other     Social History   Socioeconomic History  . Marital status: Married    Spouse name: Lauren Franklin  . Number of children: Not on file  . Years of education: Not on file  . Highest education level: Not on file  Occupational History  . 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 Financial resource strain: Not on file  . Food insecurity    Worry: Never true    Inability: Never true  . Transportation needs    Medical: No    Non-medical: No  Tobacco Use  . Smoking status: Current Some Day Smoker    Types: Cigarettes  . Smokeless tobacco: Never Used  . Tobacco comment: 3 cig per day. Doesnt smoke at home.   Substance and Sexual Activity  . Alcohol use: Yes    Alcohol/week: 0.0 standard drinks  . Drug use: No  . Sexual activity: Yes    Partners: Male    Birth control/protection: I.U.D.  Lifestyle  . Physical activity    Days per week: 1 day    Minutes per session: 10 min  . Stress: Only a little  Relationships  . Social Herbalist on phone: Once a week    Gets together: Once a week    Attends religious service: More than 4 times per year    Active member of club or organization: No    Attends meetings of clubs or organizations: Not on file    Relationship status: Married  . Intimate partner violence    Fear of current or ex partner: No   Emotionally abused: No    Physically abused: No    Forced sexual activity: No  Other Topics Concern  . Not on file  Social History Narrative   No regular execise.     Outpatient Medications Prior to Visit  Medication Sig Dispense Refill  . amphetamine-dextroamphetamine (ADDERALL) 20 MG tablet Take 1 tablet (20 mg total) by mouth 2 (two) times daily. 180 tablet 0  . levonorgestrel (MIRENA) 20 MCG/24HR IUD 1 each by Intrauterine route once.     No facility-administered medications prior to visit.     Allergies  Allergen Reactions  . Penicillins Itching    ROS Review of Systems    Objective:    Physical Exam  There were no vitals taken for this visit. Wt Readings from Last 3 Encounters:  04/22/19 126 lb (57.2 kg)  03/20/19 123 lb (55.8 kg)  01/17/19 121 lb (54.9 kg)     There are no preventive care reminders to display for this patient.  There are no preventive care reminders to display for this patient.  Lab Results  Component Value Date   TSH 1.24 06/07/2016   Lab Results  Component Value Date  WBC 8.3 10/17/2018   HGB 12.8 10/17/2018   HCT 37.6 10/17/2018   MCV 93.8 10/17/2018   PLT 335 10/17/2018   Lab Results  Component Value Date   NA 140 10/17/2018   K 4.5 10/17/2018   CO2 28 10/17/2018   GLUCOSE 88 10/17/2018   BUN 13 10/17/2018   CREATININE 0.78 10/17/2018   BILITOT 0.5 10/17/2018   ALKPHOS 93 06/07/2016   AST 17 10/17/2018   ALT 11 10/17/2018   PROT 7.2 10/17/2018   ALBUMIN 4.5 06/07/2016   CALCIUM 9.5 10/17/2018   Lab Results  Component Value Date   CHOL 165 10/17/2018   Lab Results  Component Value Date   HDL 57 10/17/2018   Lab Results  Component Value Date   LDLCALC 93 10/17/2018   Lab Results  Component Value Date   TRIG 65 10/17/2018   Lab Results  Component Value Date   CHOLHDL 2.9 10/17/2018   No results found for: HGBA1C    Assessment & Plan:  Vaccine - Patient tolerated injection well without  complications. Patient advised to schedule next injection 4 months from today.     Problem List Items Addressed This Visit    None    Visit Diagnoses    Need for HPV vaccination    -  Primary   Relevant Orders   HPV 9-valent vaccine,Recombinat (Completed)      No orders of the defined types were placed in this encounter.   Follow-up: Return in about 4 months (around 10/03/2019) for last HPV. Durene Romans, Monico Blitz, [place]

## 2019-08-04 ENCOUNTER — Telehealth: Payer: Self-pay | Admitting: Family Medicine

## 2019-08-04 MED ORDER — TRIAMCINOLONE ACETONIDE 0.1 % EX CREA: 1.0000 "application " | TOPICAL_CREAM | Freq: Two times a day (BID) | CUTANEOUS | 0 refills | Status: DC

## 2019-08-04 MED FILL — TRIAMCINOLONE 0.1% CREAM: 0.1 | 30 days supply | Qty: 30 | Fill #0

## 2019-08-04 NOTE — Telephone Encounter (Signed)
Has had a rash on her ring finger where water gets trapped in her ring for weeks.  She is avoided wearing her ring for at least 2 weeks but still has a red scaly itchy rash.

## 2019-09-08 ENCOUNTER — Other Ambulatory Visit: Payer: Self-pay | Admitting: Family Medicine

## 2019-09-08 DIAGNOSIS — Z209 Contact with and (suspected) exposure to unspecified communicable disease: Secondary | ICD-10-CM

## 2019-09-10 LAB — NOVEL CORONAVIRUS, NAA: SARS-CoV-2, NAA: NOT DETECTED

## 2019-09-10 LAB — SPECIMEN STATUS REPORT

## 2019-09-11 ENCOUNTER — Ambulatory Visit (INDEPENDENT_AMBULATORY_CARE_PROVIDER_SITE_OTHER): Payer: No Typology Code available for payment source | Admitting: Family Medicine

## 2019-09-11 ENCOUNTER — Encounter: Payer: Self-pay | Admitting: Family Medicine

## 2019-09-11 ENCOUNTER — Ambulatory Visit: Payer: No Typology Code available for payment source | Admitting: Family Medicine

## 2019-09-11 DIAGNOSIS — F988 Other specified behavioral and emotional disorders with onset usually occurring in childhood and adolescence: Secondary | ICD-10-CM | POA: Diagnosis not present

## 2019-09-11 MED ORDER — AMPHETAMINE-DEXTROAMPHETAMINE 20 MG PO TABS: 20.0000 mg | ORAL_TABLET | Freq: Two times a day (BID) | ORAL | 0 refills | Status: DC

## 2019-09-11 MED FILL — AMPHETAMINE-DEXTROAMPHETAMI: 20 | 90 days supply | Qty: 180 | Fill #0

## 2019-09-11 NOTE — Assessment & Plan Note (Signed)
Well controlled. Continue current regimen. Follow up in  6 months.  

## 2019-09-11 NOTE — Progress Notes (Addendum)
Established Patient Office Visit  Subjective:  Patient ID: Lauren Franklin, female    DOB: 08/29/1980  Age: 39 y.o. MRN: [id number]  CC:  Chief Complaint  Patient presents with  . ADHD    HPI Lauren Franklin presents for   ADD - Reports symptoms are well controlled on current regime. Denies any problems with insomnia, chest pain, palpitations, or SOB.    She is sleeping well.  Mood has been good overall.  She did go for her pap this past year.     Past Medical History:  Diagnosis Date  . ADD (attention deficit disorder)   . ADHD (attention deficit hyperactivity disorder)   . IUD    mirena  . Vaginal Pap smear, abnormal   . Viral gastroenteritis 07/12/2015    Past Surgical History:  Procedure Laterality Date  . CHOLECYSTECTOMY  2000    Family History  Problem Relation Age of Onset  . Diabetes Other     Social History   Socioeconomic History  . Marital status: Married    Spouse name: Louie Casa  . Number of children: Not on file  . Years of education: Not on file  . Highest education level: Not on file  Occupational History  . Occupation: Dispensing optician: [employer]  Tobacco Use  . Smoking status: Current Some Day Smoker    Types: Cigarettes  . Smokeless tobacco: Never Used  . Tobacco comment: 3 cig per day. Doesnt smoke at home.   Substance and Sexual Activity  . Alcohol use: Yes    Alcohol/week: 0.0 standard drinks  . Drug use: No  . Sexual activity: Yes    Partners: Male    Birth control/protection: I.U.D.  Other Topics Concern  . Not on file  Social History Narrative   No regular execise.    Social Determinants of Health   Financial Resource Strain:   . Difficulty of Paying Living Expenses: Not on file  Food Insecurity:   . Worried About Charity fundraiser in the Last Year: Not on file  . Ran Out of Food in the Last Year: Not on file  Transportation Needs:   . Lack of Transportation (Medical): Not on file  .  Lack of Transportation (Non-Medical): Not on file  Physical Activity:   . Days of Exercise per Week: Not on file  . Minutes of Exercise per Session: Not on file  Stress:   . Feeling of Stress : Not on file  Social Connections:   . Frequency of Communication with Friends and Family: Not on file  . Frequency of Social Gatherings with Friends and Family: Not on file  . Attends Religious Services: Not on file  . Active Member of Clubs or Organizations: Not on file  . Attends Archivist Meetings: Not on file  . Marital Status: Not on file  Intimate Partner Violence:   . Fear of Current or Ex-Partner: Not on file  . Emotionally Abused: Not on file  . Physically Abused: Not on file  . Sexually Abused: Not on file    Outpatient Medications Prior to Visit  Medication Sig Dispense Refill  . levonorgestrel (MIRENA) 20 MCG/24HR IUD 1 each by Intrauterine route once.    . triamcinolone cream (KENALOG) 0.1 % Apply 1 application topically 2 (two) times daily. 30 g 0  . amphetamine-dextroamphetamine (ADDERALL) 20 MG tablet Take 1 tablet (20 mg total) by mouth 2 (two) times daily. 180 tablet 0  No facility-administered medications prior to visit.    Allergies  Allergen Reactions  . Penicillins Itching    ROS Review of Systems    Objective:    Physical Exam  Constitutional: She is oriented to person, place, and time. She appears well-developed and well-nourished.  HENT:  Head: Normocephalic and atraumatic.  Cardiovascular: Normal rate, regular rhythm and normal heart sounds.  Pulmonary/Chest: Effort normal and breath sounds normal.  Neurological: She is alert and oriented to person, place, and time.  Skin: Skin is warm and dry.  Psychiatric: She has a normal mood and affect. Her behavior is normal.    BP 116/62   Pulse 95   Ht 5\' 5"  (1.651 m)   Wt 126 lb (57.2 kg)   SpO2 99%   BMI 20.97 kg/m  Wt Readings from Last 3 Encounters:  09/11/19 126 lb (57.2 kg)  04/22/19  126 lb (57.2 kg)  03/20/19 123 lb (55.8 kg)     Health Maintenance Due  Topic Date Due  . HIV Screening  01/01/1996    There are no preventive care reminders to display for this patient.  Lab Results  Component Value Date   TSH 1.24 06/07/2016   Lab Results  Component Value Date   WBC 8.3 10/17/2018   HGB 12.8 10/17/2018   HCT 37.6 10/17/2018   MCV 93.8 10/17/2018   PLT 335 10/17/2018   Lab Results  Component Value Date   NA 140 10/17/2018   K 4.5 10/17/2018   CO2 28 10/17/2018   GLUCOSE 88 10/17/2018   BUN 13 10/17/2018   CREATININE 0.78 10/17/2018   BILITOT 0.5 10/17/2018   ALKPHOS 93 06/07/2016   AST 17 10/17/2018   ALT 11 10/17/2018   PROT 7.2 10/17/2018   ALBUMIN 4.5 06/07/2016   CALCIUM 9.5 10/17/2018   Lab Results  Component Value Date   CHOL 165 10/17/2018   Lab Results  Component Value Date   HDL 57 10/17/2018   Lab Results  Component Value Date   LDLCALC 93 10/17/2018   Lab Results  Component Value Date   TRIG 65 10/17/2018   Lab Results  Component Value Date   CHOLHDL 2.9 10/17/2018   No results found for: HGBA1C    Assessment & Plan:   Problem List Items Addressed This Visit      Other   ADD (attention deficit disorder)    Well controlled. Continue current regimen. Follow up in  6 months.        Relevant Medications   amphetamine-dextroamphetamine (ADDERALL) 20 MG tablet      Meds ordered this encounter  Medications  . amphetamine-dextroamphetamine (ADDERALL) 20 MG tablet    Sig: Take 1 tablet (20 mg total) by mouth 2 (two) times daily.    Dispense:  180 tablet    Refill:  0    Follow-up: No follow-ups on file.    Beatrice Lecher, MD

## 2019-09-19 ENCOUNTER — Telehealth: Payer: No Typology Code available for payment source | Admitting: Family Medicine

## 2019-09-25 ENCOUNTER — Ambulatory Visit: Payer: No Typology Code available for payment source | Admitting: Medical-Surgical

## 2019-10-03 ENCOUNTER — Ambulatory Visit (INDEPENDENT_AMBULATORY_CARE_PROVIDER_SITE_OTHER): Payer: No Typology Code available for payment source | Admitting: Family Medicine

## 2019-10-03 VITALS — Temp 98.4°F

## 2019-10-03 DIAGNOSIS — Z23 Encounter for immunization: Secondary | ICD-10-CM | POA: Diagnosis not present

## 2019-10-03 NOTE — Progress Notes (Signed)
Agree with documentation as above.   Lauren Leclere, MD  

## 2019-10-03 NOTE — Progress Notes (Signed)
Established Patient Office Visit  Subjective:  Patient ID: Lauren Franklin, female    DOB: Jan 08, 1981  Age: 39 y.o. MRN: [id number]  CC:  Chief Complaint  Patient presents with  . Immunizations    HPI Lauren Franklin presents for last HPV.   Past Medical History:  Diagnosis Date  . ADD (attention deficit disorder)   . ADHD (attention deficit hyperactivity disorder)   . IUD    mirena  . Vaginal Pap smear, abnormal   . Viral gastroenteritis 07/12/2015    Past Surgical History:  Procedure Laterality Date  . CHOLECYSTECTOMY  2000    Family History  Problem Relation Age of Onset  . Diabetes Other     Social History   Socioeconomic History  . Marital status: Married    Spouse name: Louie Casa  . Number of children: Not on file  . Years of education: Not on file  . Highest education level: Not on file  Occupational History  . Occupation: Dispensing optician: [employer]  Tobacco Use  . Smoking status: Current Some Day Smoker    Types: Cigarettes  . Smokeless tobacco: Never Used  . Tobacco comment: 3 cig per day. Doesnt smoke at home.   Substance and Sexual Activity  . Alcohol use: Yes    Alcohol/week: 0.0 standard drinks  . Drug use: No  . Sexual activity: Yes    Partners: Male    Birth control/protection: I.U.D.  Other Topics Concern  . Not on file  Social History Narrative   No regular execise.    Social Determinants of Health   Financial Resource Strain:   . Difficulty of Paying Living Expenses: Not on file  Food Insecurity:   . Worried About Charity fundraiser in the Last Year: Not on file  . Ran Out of Food in the Last Year: Not on file  Transportation Needs:   . Lack of Transportation (Medical): Not on file  . Lack of Transportation (Non-Medical): Not on file  Physical Activity:   . Days of Exercise per Week: Not on file  . Minutes of Exercise per Session: Not on file  Stress:   . Feeling of Stress : Not on file   Social Connections:   . Frequency of Communication with Friends and Family: Not on file  . Frequency of Social Gatherings with Friends and Family: Not on file  . Attends Religious Services: Not on file  . Active Member of Clubs or Organizations: Not on file  . Attends Archivist Meetings: Not on file  . Marital Status: Not on file  Intimate Partner Violence:   . Fear of Current or Ex-Partner: Not on file  . Emotionally Abused: Not on file  . Physically Abused: Not on file  . Sexually Abused: Not on file    Outpatient Medications Prior to Visit  Medication Sig Dispense Refill  . amphetamine-dextroamphetamine (ADDERALL) 20 MG tablet Take 1 tablet (20 mg total) by mouth 2 (two) times daily. 180 tablet 0  . levonorgestrel (MIRENA) 20 MCG/24HR IUD 1 each by Intrauterine route once.    . triamcinolone cream (KENALOG) 0.1 % Apply 1 application topically 2 (two) times daily. 30 g 0   No facility-administered medications prior to visit.    Allergies  Allergen Reactions  . Penicillins Itching    ROS Review of Systems    Objective:    Physical Exam  Temp 98.4 F (36.9 C) (Oral)  Wt Readings  from Last 3 Encounters:  09/11/19 126 lb (57.2 kg)  04/22/19 126 lb (57.2 kg)  03/20/19 123 lb (55.8 kg)     Health Maintenance Due  Topic Date Due  . HIV Screening  01/01/1996    There are no preventive care reminders to display for this patient.  Lab Results  Component Value Date   TSH 1.24 06/07/2016   Lab Results  Component Value Date   WBC 8.3 10/17/2018   HGB 12.8 10/17/2018   HCT 37.6 10/17/2018   MCV 93.8 10/17/2018   PLT 335 10/17/2018   Lab Results  Component Value Date   NA 140 10/17/2018   K 4.5 10/17/2018   CO2 28 10/17/2018   GLUCOSE 88 10/17/2018   BUN 13 10/17/2018   CREATININE 0.78 10/17/2018   BILITOT 0.5 10/17/2018   ALKPHOS 93 06/07/2016   AST 17 10/17/2018   ALT 11 10/17/2018   PROT 7.2 10/17/2018   ALBUMIN 4.5 06/07/2016   CALCIUM  9.5 10/17/2018   Lab Results  Component Value Date   CHOL 165 10/17/2018   Lab Results  Component Value Date   HDL 57 10/17/2018   Lab Results  Component Value Date   LDLCALC 93 10/17/2018   Lab Results  Component Value Date   TRIG 65 10/17/2018   Lab Results  Component Value Date   CHOLHDL 2.9 10/17/2018   No results found for: HGBA1C    Assessment & Plan:  HPV - Patient tolerated injection well without complications.     Problem List Items Addressed This Visit    None    Visit Diagnoses    Need for HPV vaccination    -  Primary   Relevant Orders   HPV 9-valent vaccine,Recombinat (Completed)      No orders of the defined types were placed in this encounter.   Follow-up: No follow-ups on file.    Lavell Luster, [place]

## 2019-12-18 ENCOUNTER — Other Ambulatory Visit: Payer: Self-pay

## 2019-12-18 ENCOUNTER — Ambulatory Visit (INDEPENDENT_AMBULATORY_CARE_PROVIDER_SITE_OTHER): Payer: No Typology Code available for payment source | Admitting: Family Medicine

## 2019-12-18 ENCOUNTER — Encounter: Payer: Self-pay | Admitting: Family Medicine

## 2019-12-18 ENCOUNTER — Ambulatory Visit: Payer: No Typology Code available for payment source | Admitting: Family Medicine

## 2019-12-18 VITALS — BP 123/74 | HR 103 | Ht 65.0 in | Wt 123.0 lb

## 2019-12-18 DIAGNOSIS — Z720 Tobacco use: Secondary | ICD-10-CM | POA: Insufficient documentation

## 2019-12-18 DIAGNOSIS — L659 Nonscarring hair loss, unspecified: Secondary | ICD-10-CM | POA: Diagnosis not present

## 2019-12-18 DIAGNOSIS — R5383 Other fatigue: Secondary | ICD-10-CM

## 2019-12-18 DIAGNOSIS — F988 Other specified behavioral and emotional disorders with onset usually occurring in childhood and adolescence: Secondary | ICD-10-CM

## 2019-12-18 DIAGNOSIS — Z8639 Personal history of other endocrine, nutritional and metabolic disease: Secondary | ICD-10-CM | POA: Diagnosis not present

## 2019-12-18 DIAGNOSIS — L210 Seborrhea capitis: Secondary | ICD-10-CM

## 2019-12-18 MED ORDER — AMPHETAMINE-DEXTROAMPHETAMINE 20 MG PO TABS: 20.0000 mg | ORAL_TABLET | Freq: Two times a day (BID) | ORAL | 0 refills | Status: DC

## 2019-12-18 MED FILL — AMPHETAMINE-DEXTROAMPHETAMI: 20 | 90 days supply | Qty: 180 | Fill #0

## 2019-12-18 NOTE — Progress Notes (Signed)
Established Patient Office Visit  Subjective:  Patient ID: Lauren Franklin, female    DOB: 12/02/1980  Age: 39 y.o. MRN: [id number]  CC: No chief complaint on file.   HPI Scheryl Sol presents for   ADD - Reports symptoms are well controlled on current regime. Denies any problems with insomnia, chest pain, palpitations, or SOB.   She also would like to have her thyroid checked sometime soon.  She is just noticed a few symptoms including some weight gain, increased hair loss.  She says even her hairdresser had noticed.  She also noticed that there was a little bit of yellow tint to her nails.  She also has noticed some change in her scalp its been much more dry and flaky and more dandruff and she is never had that before.  She is also felt a little bit more tired than usual she does have a prior history of iron deficiency anemia.  She is not vegetarian.   Past Medical History:  Diagnosis Date  . ADD (attention deficit disorder)   . ADHD (attention deficit hyperactivity disorder)   . IUD    mirena  . Vaginal Pap smear, abnormal   . Viral gastroenteritis 07/12/2015    Past Surgical History:  Procedure Laterality Date  . CHOLECYSTECTOMY  2000    Family History  Problem Relation Age of Onset  . Diabetes Other     Social History   Socioeconomic History  . Marital status: Married    Spouse name: Louie Casa  . Number of children: Not on file  . Years of education: Not on file  . Highest education level: Not on file  Occupational History  . Occupation: Dispensing optician: [employer]  Tobacco Use  . Smoking status: Former Smoker    Types: Cigarettes    Quit date: 09/26/2019    Years since quitting: 0.2  . Smokeless tobacco: Never Used  . Tobacco comment: 3 cig per day. Doesnt smoke at home.   Substance and Sexual Activity  . Alcohol use: Yes    Alcohol/week: 0.0 standard drinks  . Drug use: No  . Sexual activity: Yes    Partners: Male   Birth control/protection: I.U.D.  Other Topics Concern  . Not on file  Social History Narrative   No regular execise.    Social Determinants of Health   Financial Resource Strain:   . Difficulty of Paying Living Expenses:   Food Insecurity:   . Worried About Charity fundraiser in the Last Year:   . Arboriculturist in the Last Year:   Transportation Needs:   . Film/video editor (Medical):   Marland Kitchen Lack of Transportation (Non-Medical):   Physical Activity:   . Days of Exercise per Week:   . Minutes of Exercise per Session:   Stress:   . Feeling of Stress :   Social Connections:   . Frequency of Communication with Friends and Family:   . Frequency of Social Gatherings with Friends and Family:   . Attends Religious Services:   . Active Member of Clubs or Organizations:   . Attends Archivist Meetings:   Marland Kitchen Marital Status:   Intimate Partner Violence:   . Fear of Current or Ex-Partner:   . Emotionally Abused:   Marland Kitchen Physically Abused:   . Sexually Abused:     Outpatient Medications Prior to Visit  Medication Sig Dispense Refill  . levonorgestrel (MIRENA) 20 MCG/24HR IUD  1 each by Intrauterine route once.    Marland Kitchen amphetamine-dextroamphetamine (ADDERALL) 20 MG tablet Take 1 tablet (20 mg total) by mouth 2 (two) times daily. 180 tablet 0  . triamcinolone cream (KENALOG) 0.1 % Apply 1 application topically 2 (two) times daily. 30 g 0   No facility-administered medications prior to visit.    Allergies  Allergen Reactions  . Penicillins Itching    ROS Review of Systems    Objective:    Physical Exam  Constitutional: She is oriented to person, place, and time. She appears well-developed and well-nourished.  HENT:  Head: Normocephalic and atraumatic.  Cardiovascular: Normal rate, regular rhythm and normal heart sounds.  Pulmonary/Chest: Effort normal and breath sounds normal.  Neurological: She is alert and oriented to person, place, and time.  Skin: Skin is warm  and dry.  Psychiatric: She has a normal mood and affect. Her behavior is normal.    BP 123/74   Pulse (!) 103   Ht 5\' 5"  (1.651 m)   Wt 123 lb (55.8 kg)   SpO2 100%   BMI 20.47 kg/m  Wt Readings from Last 3 Encounters:  12/18/19 123 lb (55.8 kg)  09/11/19 126 lb (57.2 kg)  04/22/19 126 lb (57.2 kg)     There are no preventive care reminders to display for this patient.  There are no preventive care reminders to display for this patient.  Lab Results  Component Value Date   TSH 1.24 06/07/2016   Lab Results  Component Value Date   WBC 8.3 10/17/2018   HGB 12.8 10/17/2018   HCT 37.6 10/17/2018   MCV 93.8 10/17/2018   PLT 335 10/17/2018   Lab Results  Component Value Date   NA 140 10/17/2018   K 4.5 10/17/2018   CO2 28 10/17/2018   GLUCOSE 88 10/17/2018   BUN 13 10/17/2018   CREATININE 0.78 10/17/2018   BILITOT 0.5 10/17/2018   ALKPHOS 93 06/07/2016   AST 17 10/17/2018   ALT 11 10/17/2018   PROT 7.2 10/17/2018   ALBUMIN 4.5 06/07/2016   CALCIUM 9.5 10/17/2018   Lab Results  Component Value Date   CHOL 165 10/17/2018   Lab Results  Component Value Date   HDL 57 10/17/2018   Lab Results  Component Value Date   LDLCALC 93 10/17/2018   Lab Results  Component Value Date   TRIG 65 10/17/2018   Lab Results  Component Value Date   CHOLHDL 2.9 10/17/2018   No results found for: HGBA1C    Assessment & Plan:   Problem List Items Addressed This Visit      Other   Nicotine vapor product user    Urged weaning back on nicotine use.  Sometimes she vapes without the nicotine.      ADD (attention deficit disorder)    Well on current regimen.  Refill sent to pharmacy.  Plan follow-up in 6 months.      Relevant Medications   amphetamine-dextroamphetamine (ADDERALL) 20 MG tablet    Other Visit Diagnoses    Hair loss    -  Primary   Relevant Orders   Lipid panel   COMPLETE METABOLIC PANEL WITH GFR   CBC   TSH   Fe+TIBC+Fer   History of iron  deficiency       Relevant Orders   Fe+TIBC+Fer   Dandruff in adult       Fatigue, unspecified type         Hair loss-could be hormonal versus  female pattern baldness versus thyroid deficiency.  Will check labs today.  She also has prior history of iron deficiency anemia this could be contributing as well.  Dandruff-new onset she has never had this before.  We discussed using a tea tree oil type shampoo if that is not helpful then consider switching to a Selsun type blue product and if that is not helping then can also use Scalpicin as needed on locally irritated or itchy areas.  No evidence of seborrheic dermatitis or psoriasis.  Could consider treatment with ketoconazole shampoo if not improving.  History iron deficiency anemia-we will check for iron deficiency.  Fatigue-again we will check for thyroid disorder and anemia as well as electrolyte disturbance.  Meds ordered this encounter  Medications  . amphetamine-dextroamphetamine (ADDERALL) 20 MG tablet    Sig: Take 1 tablet (20 mg total) by mouth 2 (two) times daily.    Dispense:  180 tablet    Refill:  0    Follow-up: Return in about 6 months (around 06/18/2020) for ADD.    Beatrice Lecher, MD

## 2019-12-18 NOTE — Assessment & Plan Note (Signed)
Well on current regimen.  Refill sent to pharmacy.  Plan follow-up in 6 months.

## 2019-12-18 NOTE — Assessment & Plan Note (Signed)
Urged weaning back on nicotine use.  Sometimes she vapes without the nicotine.

## 2019-12-22 LAB — CBC
HCT: 37.7 % (ref 35.0–45.0)
Hemoglobin: 12.5 g/dL (ref 11.7–15.5)
MCH: 31.6 pg (ref 27.0–33.0)
MCHC: 33.2 g/dL (ref 32.0–36.0)
MCV: 95.2 fL (ref 80.0–100.0)
MPV: 10.3 fL (ref 7.5–12.5)
Platelets: 321 10*3/uL (ref 140–400)
RBC: 3.96 10*6/uL (ref 3.80–5.10)
RDW: 12 % (ref 11.0–15.0)
WBC: 7.9 10*3/uL (ref 3.8–10.8)

## 2019-12-22 LAB — COMPLETE METABOLIC PANEL WITH GFR
AG Ratio: 1.7 (calc) (ref 1.0–2.5)
ALT: 13 U/L (ref 6–29)
AST: 17 U/L (ref 10–30)
Albumin: 4.6 g/dL (ref 3.6–5.1)
Alkaline phosphatase (APISO): 85 U/L (ref 31–125)
BUN: 17 mg/dL (ref 7–25)
CO2: 27 mmol/L (ref 20–32)
Calcium: 9.6 mg/dL (ref 8.6–10.2)
Chloride: 106 mmol/L (ref 98–110)
Creat: 0.85 mg/dL (ref 0.50–1.10)
GFR, Est African American: 101 mL/min/{1.73_m2} (ref >=60)
GFR, Est Non African American: 87 mL/min/{1.73_m2} (ref >=60)
Globulin: 2.7 g/dL (calc) (ref 1.9–3.7)
Glucose, Bld: 94 mg/dL (ref 65–99)
Potassium: 4.4 mmol/L (ref 3.5–5.3)
Sodium: 140 mmol/L (ref 135–146)
Total Bilirubin: 0.8 mg/dL (ref 0.2–1.2)
Total Protein: 7.3 g/dL (ref 6.1–8.1)

## 2019-12-22 LAB — LIPID PANEL
Cholesterol: 153 mg/dL (ref <200)
HDL: 50 mg/dL (ref >=50)
LDL Cholesterol (Calc): 90 mg/dL (calc)
Non-HDL Cholesterol (Calc): 103 mg/dL (calc) (ref <130)
Total CHOL/HDL Ratio: 3.1 (calc) (ref <5.0)
Triglycerides: 50 mg/dL (ref <150)

## 2019-12-22 LAB — IRON,TIBC AND FERRITIN PANEL
%SAT: 32 % (calc) (ref 16–45)
Ferritin: 90 ng/mL (ref 16–154)
Iron: 104 ug/dL (ref 40–190)
TIBC: 321 mcg/dL (calc) (ref 250–450)

## 2019-12-22 LAB — TSH: TSH: 1.24 mIU/L

## 2020-02-12 ENCOUNTER — Encounter: Payer: Self-pay | Admitting: Family Medicine

## 2020-02-12 ENCOUNTER — Ambulatory Visit (INDEPENDENT_AMBULATORY_CARE_PROVIDER_SITE_OTHER): Payer: No Typology Code available for payment source | Admitting: Family Medicine

## 2020-02-12 ENCOUNTER — Other Ambulatory Visit: Payer: Self-pay

## 2020-02-12 VITALS — BP 124/70 | HR 86 | Ht 65.0 in | Wt 124.0 lb

## 2020-02-12 DIAGNOSIS — Z0000 Encounter for general adult medical examination without abnormal findings: Secondary | ICD-10-CM | POA: Diagnosis not present

## 2020-02-12 NOTE — Progress Notes (Signed)
Subjective:     Bemnet Trovato is a 39 y.o. female and is here for a comprehensive physical exam. The patient reports no problems.  She is not currently exercising regularly but encouraged her to start again.  Though she does stay active.  She has been sleeping well she feels like mood is well controlled.  Tetanus is up-to-date.  She did receive her flu vaccine last fall.  Recent labs were completed in May.  Social History   Socioeconomic History  . Marital status: Married    Spouse name: Louie Casa  . Number of children: Not on file  . Years of education: Not on file  . Highest education level: Not on file  Occupational History  . Occupation: Dispensing optician: [employer]  Tobacco Use  . Smoking status: Former Smoker    Types: Cigarettes    Quit date: 09/26/2019    Years since quitting: 0.3  . Smokeless tobacco: Never Used  . Tobacco comment: 3 cig per day. Doesnt smoke at home.   Substance and Sexual Activity  . Alcohol use: Yes    Alcohol/week: 0.0 standard drinks  . Drug use: No  . Sexual activity: Yes    Partners: Male    Birth control/protection: I.U.D.  Other Topics Concern  . Not on file  Social History Narrative   No regular execise.    Social Determinants of Health   Financial Resource Strain:   . Difficulty of Paying Living Expenses:   Food Insecurity:   . Worried About Charity fundraiser in the Last Year:   . Arboriculturist in the Last Year:   Transportation Needs:   . Film/video editor (Medical):   Marland Kitchen Lack of Transportation (Non-Medical):   Physical Activity:   . Days of Exercise per Week:   . Minutes of Exercise per Session:   Stress:   . Feeling of Stress :   Social Connections:   . Frequency of Communication with Friends and Family:   . Frequency of Social Gatherings with Friends and Family:   . Attends Religious Services:   . Active Member of Clubs or Organizations:   . Attends Archivist Meetings:   Marland Kitchen Marital  Status:   Intimate Partner Violence:   . Fear of Current or Ex-Partner:   . Emotionally Abused:   Marland Kitchen Physically Abused:   . Sexually Abused:    Health Maintenance  Topic Date Due  . Hepatitis C Screening  Never done  . COVID-19 Vaccine (1) 12/17/2020 (Originally 12/31/1992)  . HIV Screening  12/17/2020 (Originally 01/01/1996)  . INFLUENZA VACCINE  03/21/2020  . PAP SMEAR-Modifier  03/19/2022  . TETANUS/TDAP  07/17/2023    The following portions of the patient's history were reviewed and updated as appropriate: allergies, current medications, past family history, past medical history, past social history, past surgical history and problem list.  Review of Systems A comprehensive review of systems was negative.   Objective:    BP 124/70   Pulse 86   Ht 5\' 5"  (1.651 m)   Wt 124 lb (56.2 kg)   SpO2 100%   BMI 20.63 kg/m  General appearance: alert, cooperative and appears stated age Head: Normocephalic, without obvious abnormality, atraumatic Eyes: conj clear, EOMI, PEERLA Ears: normal TM's and external ear canals both ears Nose: Nares normal. Septum midline. Mucosa normal. No drainage or sinus tenderness. Throat: lips, mucosa, and tongue normal; teeth and gums normal Neck: no adenopathy,  no carotid bruit, no JVD, supple, symmetrical, trachea midline and thyroid not enlarged, symmetric, no tenderness/mass/nodules Back: symmetric, no curvature. ROM normal. No CVA tenderness. Lungs: clear to auscultation bilaterally Heart: regular rate and rhythm, S1, S2 normal, no murmur, click, rub or gallop Abdomen: soft, non-tender; bowel sounds normal; no masses,  no organomegaly Extremities: extremities normal, atraumatic, no cyanosis or edema Pulses: 2+ and symmetric Skin: Skin color, texture, turgor normal. No rashes or lesions Lymph nodes: Cervical, supraclavicular, and axillary nodes normal. Neurologic: Alert and oriented X 3, normal strength and tone. Normal symmetric reflexes. Normal  coordination and gait    Assessment:    Healthy female exam.      Plan:     See After Visit Summary for Counseling Recommendations   Keep up a regular exercise program and make sure you are eating a healthy diet Try to eat 4 servings of dairy a day, or if you are lactose intolerant take a calcium with vitamin D daily.  Your vaccines are up to date.

## 2020-02-12 NOTE — Patient Instructions (Signed)
Health Maintenance, Female Adopting a healthy lifestyle and getting preventive care are important in promoting health and wellness. Ask your health care provider about:  The right schedule for you to have regular tests and exams.  Things you can do on your own to prevent diseases and keep yourself healthy. What should I know about diet, weight, and exercise? Eat a healthy diet   Eat a diet that includes plenty of vegetables, fruits, low-fat dairy products, and lean protein.  Do not eat a lot of foods that are high in solid fats, added sugars, or sodium. Maintain a healthy weight Body mass index (BMI) is used to identify weight problems. It estimates body fat based on height and weight. Your health care provider can help determine your BMI and help you achieve or maintain a healthy weight. Get regular exercise Get regular exercise. This is one of the most important things you can do for your health. Most adults should:  Exercise for at least 150 minutes each week. The exercise should increase your heart rate and make you sweat (moderate-intensity exercise).  Do strengthening exercises at least twice a week. This is in addition to the moderate-intensity exercise.  Spend less time sitting. Even light physical activity can be beneficial. Watch cholesterol and blood lipids Have your blood tested for lipids and cholesterol at 39 years of age, then have this test every 5 years. Have your cholesterol levels checked more often if:  Your lipid or cholesterol levels are high.  You are older than 40 years of age.  You are at high risk for heart disease. What should I know about cancer screening? Depending on your health history and family history, you may need to have cancer screening at various ages. This may include screening for:  Breast cancer.  Cervical cancer.  Colorectal cancer.  Skin cancer.  Lung cancer. What should I know about heart disease, diabetes, and high blood  pressure? Blood pressure and heart disease  High blood pressure causes heart disease and increases the risk of stroke. This is more likely to develop in people who have high blood pressure readings, are of African descent, or are overweight.  Have your blood pressure checked: ? Every 3-5 years if you are 18-39 years of age. ? Every year if you are 40 years old or older. Diabetes Have regular diabetes screenings. This checks your fasting blood sugar level. Have the screening done:  Once every three years after age 40 if you are at a normal weight and have a low risk for diabetes.  More often and at a younger age if you are overweight or have a high risk for diabetes. What should I know about preventing infection? Hepatitis B If you have a higher risk for hepatitis B, you should be screened for this virus. Talk with your health care provider to find out if you are at risk for hepatitis B infection. Hepatitis C Testing is recommended for:  Everyone born from 1945 through 1965.  Anyone with known risk factors for hepatitis C. Sexually transmitted infections (STIs)  Get screened for STIs, including gonorrhea and chlamydia, if: ? You are sexually active and are younger than 39 years of age. ? You are older than 39 years of age and your health care provider tells you that you are at risk for this type of infection. ? Your sexual activity has changed since you were last screened, and you are at increased risk for chlamydia or gonorrhea. Ask your health care provider if   you are at risk.  Ask your health care provider about whether you are at high risk for HIV. Your health care provider may recommend a prescription medicine to help prevent HIV infection. If you choose to take medicine to prevent HIV, you should first get tested for HIV. You should then be tested every 3 months for as long as you are taking the medicine. Pregnancy  If you are about to stop having your period (premenopausal) and  you may become pregnant, seek counseling before you get pregnant.  Take 400 to 800 micrograms (mcg) of folic acid every day if you become pregnant.  Ask for birth control (contraception) if you want to prevent pregnancy. Osteoporosis and menopause Osteoporosis is a disease in which the bones lose minerals and strength with aging. This can result in bone fractures. If you are 65 years old or older, or if you are at risk for osteoporosis and fractures, ask your health care provider if you should:  Be screened for bone loss.  Take a calcium or vitamin D supplement to lower your risk of fractures.  Be given hormone replacement therapy (HRT) to treat symptoms of menopause. Follow these instructions at home: Lifestyle  Do not use any products that contain nicotine or tobacco, such as cigarettes, e-cigarettes, and chewing tobacco. If you need help quitting, ask your health care provider.  Do not use street drugs.  Do not share needles.  Ask your health care provider for help if you need support or information about quitting drugs. Alcohol use  Do not drink alcohol if: ? Your health care provider tells you not to drink. ? You are pregnant, may be pregnant, or are planning to become pregnant.  If you drink alcohol: ? Limit how much you use to 0-1 drink a day. ? Limit intake if you are breastfeeding.  Be aware of how much alcohol is in your drink. In the [place] one drink equals one 12 oz bottle of beer (355 mL), one 5 oz glass of wine (148 mL), or one 1 oz glass of hard liquor (44 mL). General instructions  Schedule regular health, dental, and eye exams.  Stay current with your vaccines.  Tell your health care provider if: ? You often feel depressed. ? You have ever been abused or do not feel safe at home. Summary  Adopting a healthy lifestyle and getting preventive care are important in promoting health and wellness.  Follow your health care provider's instructions about healthy  diet, exercising, and getting tested or screened for diseases.  Follow your health care provider's instructions on monitoring your cholesterol and blood pressure. This information is not intended to replace advice given to you by your health care provider. Make sure you discuss any questions you have with your health care provider. Document Revised: 07/31/2018 Document Reviewed: 07/31/2018 Elsevier Patient Education  2020 [employer]  

## 2020-03-08 ENCOUNTER — Ambulatory Visit (INDEPENDENT_AMBULATORY_CARE_PROVIDER_SITE_OTHER): Payer: No Typology Code available for payment source | Admitting: Family Medicine

## 2020-03-08 ENCOUNTER — Encounter: Payer: Self-pay | Admitting: Family Medicine

## 2020-03-08 VITALS — BP 106/71 | HR 77 | Temp 98.3°F | Wt 122.6 lb

## 2020-03-08 DIAGNOSIS — R109 Unspecified abdominal pain: Secondary | ICD-10-CM

## 2020-03-08 DIAGNOSIS — R112 Nausea with vomiting, unspecified: Secondary | ICD-10-CM

## 2020-03-08 LAB — CBC WITH DIFFERENTIAL/PLATELET
Absolute Monocytes: 823 cells/uL (ref 200–950)
Basophils Absolute: 22 cells/uL (ref 0–200)
Basophils Relative: 0.4 %
Eosinophils Absolute: 280 cells/uL (ref 15–500)
Eosinophils Relative: 5 %
HCT: 37.1 % (ref 35.0–45.0)
Hemoglobin: 12.4 g/dL (ref 11.7–15.5)
Lymphs Abs: 1854 cells/uL (ref 850–3900)
MCH: 31.2 pg (ref 27.0–33.0)
MCHC: 33.4 g/dL (ref 32.0–36.0)
MCV: 93.5 fL (ref 80.0–100.0)
MPV: 10.4 fL (ref 7.5–12.5)
Monocytes Relative: 14.7 %
Neutro Abs: 2621 cells/uL (ref 1500–7800)
Neutrophils Relative %: 46.8 %
Platelets: 311 10*3/uL (ref 140–400)
RBC: 3.97 10*6/uL (ref 3.80–5.10)
RDW: 12.1 % (ref 11.0–15.0)
Total Lymphocyte: 33.1 %
WBC: 5.6 10*3/uL (ref 3.8–10.8)

## 2020-03-08 LAB — COMPLETE METABOLIC PANEL WITH GFR
AG Ratio: 1.9 (calc) (ref 1.0–2.5)
ALT: 18 U/L (ref 6–29)
AST: 23 U/L (ref 10–30)
Albumin: 4.3 g/dL (ref 3.6–5.1)
Alkaline phosphatase (APISO): 119 U/L (ref 31–125)
BUN: 13 mg/dL (ref 7–25)
CO2: 30 mmol/L (ref 20–32)
Calcium: 8.8 mg/dL (ref 8.6–10.2)
Chloride: 105 mmol/L (ref 98–110)
Creat: 0.64 mg/dL (ref 0.50–1.10)
GFR, Est African American: 130 mL/min/{1.73_m2} (ref >=60)
GFR, Est Non African American: 112 mL/min/{1.73_m2} (ref >=60)
Globulin: 2.3 g/dL (calc) (ref 1.9–3.7)
Glucose, Bld: 94 mg/dL (ref 65–99)
Potassium: 4.1 mmol/L (ref 3.5–5.3)
Sodium: 139 mmol/L (ref 135–146)
Total Bilirubin: 0.5 mg/dL (ref 0.2–1.2)
Total Protein: 6.6 g/dL (ref 6.1–8.1)

## 2020-03-08 LAB — POCT URINALYSIS DIP (CLINITEK)
Glucose, UA: NEGATIVE mg/dL
Nitrite, UA: NEGATIVE
POC PROTEIN,UA: 30 — AB
Spec Grav, UA: 1.025 (ref 1.010–1.025)
Urobilinogen, UA: >=8 E.U./dL — AB
pH, UA: 6.5 (ref 5.0–8.0)

## 2020-03-08 MED ORDER — SULFAMETHOXAZOLE-TRIMETHOPRIM 800-160 MG PO TABS: 1.0000 | ORAL_TABLET | Freq: Two times a day (BID) | ORAL | 0 refills | Status: DC

## 2020-03-08 MED ORDER — CEFTRIAXONE SODIUM 1 G IJ SOLR
1.0000 g | Freq: Once | INTRAMUSCULAR | Status: AC
  Administered 2020-03-08: 1 g via INTRAMUSCULAR

## 2020-03-08 MED FILL — SULFAMETHOXAZOLE-TMP DS TAB: 800-160 | 7 days supply | Qty: 14 | Fill #0

## 2020-03-08 NOTE — Progress Notes (Signed)
Acute Office Visit  Subjective:    Patient ID: Lauren Franklin, female    DOB: [birth year], 39 y.o.   MRN: [id number]  Chief Complaint  Patient presents with  . Back Pain  . Hematuria    HPI Patient is in today for back pain and vomiting. Over the weekend started to feel nauseated and ran a fever and was really fatigued. Was having bilat mid backpain and has only urinated once in the last 24 hours until today.  She did not experience any diarrhea she has not had any abdominal pain.  She took a Phenergan and that actually seemed to settle her stomach.  She only vomited a total of 3 times.  She only feels like she ran a fever the very first day.  Her urine has been very dark.  She said yesterday she try to drink 2 large Gatorade's and some additional fluid and again only urinated once she is never had kidney stones.  She denies any chest symptoms.  She has had UTIs before.  Past Medical History:  Diagnosis Date  . ADD (attention deficit disorder)   . ADHD (attention deficit hyperactivity disorder)   . IUD    mirena  . Vaginal Pap smear, abnormal   . Viral gastroenteritis 07/12/2015    Past Surgical History:  Procedure Laterality Date  . CHOLECYSTECTOMY  2000    Family History  Problem Relation Age of Onset  . Diabetes Other     Social History   Socioeconomic History  . Marital status: Married    Spouse name: Louie Casa  . Number of children: Not on file  . Years of education: Not on file  . Highest education level: Not on file  Occupational History  . Occupation: Dispensing optician: [employer]  Tobacco Use  . Smoking status: Former Smoker    Types: Cigarettes    Quit date: 09/26/2019    Years since quitting: 0.4  . Smokeless tobacco: Never Used  . Tobacco comment: 3 cig per day. Doesnt smoke at home.   Substance and Sexual Activity  . Alcohol use: Yes    Alcohol/week: 0.0 standard drinks  . Drug use: No  . Sexual activity: Yes    Partners: Male     Birth control/protection: I.U.D.  Other Topics Concern  . Not on file  Social History Narrative   No regular execise.    Social Determinants of Health   Financial Resource Strain:   . Difficulty of Paying Living Expenses:   Food Insecurity:   . Worried About Charity fundraiser in the Last Year:   . Arboriculturist in the Last Year:   Transportation Needs:   . Film/video editor (Medical):   Marland Kitchen Lack of Transportation (Non-Medical):   Physical Activity:   . Days of Exercise per Week:   . Minutes of Exercise per Session:   Stress:   . Feeling of Stress :   Social Connections:   . Frequency of Communication with Friends and Family:   . Frequency of Social Gatherings with Friends and Family:   . Attends Religious Services:   . Active Member of Clubs or Organizations:   . Attends Archivist Meetings:   Marland Kitchen Marital Status:   Intimate Partner Violence:   . Fear of Current or Ex-Partner:   . Emotionally Abused:   Marland Kitchen Physically Abused:   . Sexually Abused:     Outpatient Medications Prior to Visit  Medication Sig Dispense Refill  . amphetamine-dextroamphetamine (ADDERALL) 20 MG tablet Take 1 tablet (20 mg total) by mouth 2 (two) times daily. 180 tablet 0  . levonorgestrel (MIRENA) 20 MCG/24HR IUD 1 each by Intrauterine route once.     No facility-administered medications prior to visit.    Allergies  Allergen Reactions  . Penicillins Itching    Review of Systems     Objective:    Physical Exam Vitals reviewed.  Constitutional:      Appearance: She is well-developed.  HENT:     Head: Normocephalic and atraumatic.  Eyes:     Conjunctiva/sclera: Conjunctivae normal.  Cardiovascular:     Rate and Rhythm: Normal rate.  Pulmonary:     Effort: Pulmonary effort is normal.  Skin:    General: Skin is dry.     Coloration: Skin is not pale.  Neurological:     Mental Status: She is alert and oriented to person, place, and time.  Psychiatric:         Behavior: Behavior normal.     BP 106/71   Pulse 77   Temp 98.3 F (36.8 C) (Oral)   Wt 122 lb 9.6 oz (55.6 kg)   SpO2 100%   BMI 20.40 kg/m  Wt Readings from Last 3 Encounters:  03/08/20 122 lb 9.6 oz (55.6 kg)  02/12/20 124 lb (56.2 kg)  12/18/19 123 lb (55.8 kg)    Health Maintenance Due  Topic Date Due  . Hepatitis C Screening  Never done    There are no preventive care reminders to display for this patient.   Lab Results  Component Value Date   TSH 1.24 12/22/2019   Lab Results  Component Value Date   WBC 7.9 12/22/2019   HGB 12.5 12/22/2019   HCT 37.7 12/22/2019   MCV 95.2 12/22/2019   PLT 321 12/22/2019   Lab Results  Component Value Date   NA 140 12/22/2019   K 4.4 12/22/2019   CO2 27 12/22/2019   GLUCOSE 94 12/22/2019   BUN 17 12/22/2019   CREATININE 0.85 12/22/2019   BILITOT 0.8 12/22/2019   ALKPHOS 93 06/07/2016   AST 17 12/22/2019   ALT 13 12/22/2019   PROT 7.3 12/22/2019   ALBUMIN 4.5 06/07/2016   CALCIUM 9.6 12/22/2019   Lab Results  Component Value Date   CHOL 153 12/22/2019   Lab Results  Component Value Date   HDL 50 12/22/2019   Lab Results  Component Value Date   LDLCALC 90 12/22/2019   Lab Results  Component Value Date   TRIG 50 12/22/2019   Lab Results  Component Value Date   CHOLHDL 3.1 12/22/2019   No results found for: HGBA1C     Assessment & Plan:   Problem List Items Addressed This Visit    None    Visit Diagnoses    Flank pain    -  Primary   Relevant Medications   cefTRIAXone (ROCEPHIN) injection 1 g (Completed)   Other Relevant Orders   CBC with Differential/Platelet   COMPLETE METABOLIC PANEL WITH GFR   POCT URINALYSIS DIP (CLINITEK) (Completed)   Urine Culture   Non-intractable vomiting with nausea, unspecified vomiting type       Relevant Orders   CBC with Differential/Platelet   COMPLETE METABOLIC PANEL WITH GFR   POCT URINALYSIS DIP (CLINITEK) (Completed)   Urine Culture     Flank pain  with vomiting-vomiting seems to have stopped and settle down.  I am concerned  about the flank pain with change in urination.  Urinalysis shows protein and leukocytes.  We will get a CBC with differential as well as CMP.  Consider pyelonephritis we will treat with IM recheck Rocephin.  Also consider kidney stone the pain is bilateral so this would be unusual.  Also start her on oral Bactrim.  We will send urine for culture.  Meds ordered this encounter  Medications  . cefTRIAXone (ROCEPHIN) injection 1 g    Order Specific Question:   Antibiotic Indication:    Answer:   UTI  . sulfamethoxazole-trimethoprim (BACTRIM DS) 800-160 MG tablet    Sig: Take 1 tablet by mouth 2 (two) times daily.    Dispense:  14 tablet    Refill:  0     Beatrice Lecher, MD

## 2020-03-09 LAB — URINE CULTURE
MICRO NUMBER:: 10721731
Result:: NO GROWTH
SPECIMEN QUALITY:: ADEQUATE

## 2020-03-09 NOTE — Progress Notes (Signed)
All labs are normal. 

## 2020-04-30 ENCOUNTER — Other Ambulatory Visit: Payer: Self-pay

## 2020-04-30 DIAGNOSIS — F988 Other specified behavioral and emotional disorders with onset usually occurring in childhood and adolescence: Secondary | ICD-10-CM

## 2020-04-30 MED ORDER — AMPHETAMINE-DEXTROAMPHETAMINE 20 MG PO TABS: 20.0000 mg | ORAL_TABLET | Freq: Two times a day (BID) | ORAL | 0 refills | Status: DC

## 2020-04-30 MED FILL — AMPHETAMINE-DEXTROAMPHETAMI: 20 | 90 days supply | Qty: 180 | Fill #0

## 2020-05-14 MED FILL — AMPHETAMINE-DEXTROAMPHETAMI: 20 | 90 days supply | Qty: 180 | Fill #0

## 2020-06-07 ENCOUNTER — Encounter: Payer: Self-pay | Admitting: Family Medicine

## 2020-06-07 ENCOUNTER — Telehealth (INDEPENDENT_AMBULATORY_CARE_PROVIDER_SITE_OTHER): Payer: No Typology Code available for payment source | Admitting: Family Medicine

## 2020-06-07 VITALS — Ht 65.0 in | Wt 120.0 lb

## 2020-06-07 DIAGNOSIS — J069 Acute upper respiratory infection, unspecified: Secondary | ICD-10-CM

## 2020-06-07 NOTE — Progress Notes (Signed)
Virtual Visit via Telephone Note  I connected with Lauren Franklin on 06/07/20 at 11:30 AM EDT by telephone and verified that I am speaking with the correct person using two identifiers.   I discussed the limitations, risks, security and privacy concerns of performing an evaluation and management service by telephone and the availability of in person appointments. I also discussed with the patient that there may be a patient responsible charge related to this service. The patient expressed understanding and agreed to proceed.  Patient location: at home  Provider loccation: In office   Subjective:    CC: Cough.    HPI: Pt reports that she got sick on Thursday with fever to 99.5 and sore throat.  Took 2 COVID tests and both were negative. Temperature was 99.5 on Friday. No temperature since then. Denies f/s/c/n/v/dNo sob or cp. She does C/O Headache, bodyches, fatigue, puffy eyes. OTC cold/flu. Fever has resolved and ST has resolved.   When she woke up this morning and coughed up some bright red blood mixed with mucus (yellow/gray). No SOB.  Says feels a little better today. + smoker   Past medical history, Surgical history, Family history not pertinant except as noted below, Social history, Allergies, and medications have been entered into the medical record, reviewed, and corrections made.   Review of Systems: No fevers, chills, night sweats, weight loss, chest pain, or shortness of breath.   Objective:    General: Speaking clearly in complete sentences without any shortness of breath.  Alert and oriented x3.  Normal judgment. No apparent acute distress.    Impression and Recommendations:    Viral URI - call if not continuing to improve. Likely viral.  Bright red blood likely from the back of the throat.  Ok to use cough syrup if needed. Run humidifier and rest and hydrate.       I discussed the assessment and treatment plan with the patient. The patient was provided an  opportunity to ask questions and all were answered. The patient agreed with the plan and demonstrated an understanding of the instructions.   The patient was advised to call back or seek an in-person evaluation if the symptoms worsen or if the condition fails to improve as anticipated.  I provided 10 minutes of non-face-to-face time during this encounter.   Beatrice Lecher, MD

## 2020-06-07 NOTE — Progress Notes (Signed)
Pt reports that she got sick on Thursday and took 2 COVID tests and both were negative. Temperature was 99.5 on Friday. No temperature since then. Denies f/s/c/n/v/dNo sob or cp. She does C/O Headache,puffy eyes. OTC cold/flu.   When she woke up this morning and coughed up some blood mixed with mucus (yellow/gray)

## 2020-08-24 ENCOUNTER — Other Ambulatory Visit: Payer: Self-pay | Admitting: Family Medicine

## 2020-08-24 ENCOUNTER — Other Ambulatory Visit: Payer: Self-pay

## 2020-08-24 ENCOUNTER — Encounter: Payer: Self-pay | Admitting: Family Medicine

## 2020-08-24 ENCOUNTER — Ambulatory Visit (INDEPENDENT_AMBULATORY_CARE_PROVIDER_SITE_OTHER): Payer: No Typology Code available for payment source | Admitting: Family Medicine

## 2020-08-24 VITALS — BP 99/57 | HR 66 | Ht 65.0 in | Wt 126.1 lb

## 2020-08-24 DIAGNOSIS — Z720 Tobacco use: Secondary | ICD-10-CM

## 2020-08-24 DIAGNOSIS — F988 Other specified behavioral and emotional disorders with onset usually occurring in childhood and adolescence: Secondary | ICD-10-CM

## 2020-08-24 MED ORDER — AMPHETAMINE-DEXTROAMPHETAMINE 20 MG PO TABS: 20.0000 mg | ORAL_TABLET | Freq: Two times a day (BID) | ORAL | 0 refills | Status: DC

## 2020-08-24 MED FILL — AMPHETAMINE-DEXTROAMPHETAMI: 20 | 90 days supply | Qty: 180 | Fill #0

## 2020-08-24 NOTE — Assessment & Plan Note (Signed)
Well controlled. Continue current regimen. Follow up in  6 mo  

## 2020-08-24 NOTE — Progress Notes (Signed)
Established Patient Office Visit  Subjective:  Patient ID: Lauren Franklin, female    DOB: 1981-02-14  Age: 40 y.o. MRN: [id number]  CC:  Chief Complaint  Patient presents with  . ADD    HPI Lauren Franklin presents for   ADHD - Reports symptoms are well controlled on current regime. Denies any problems with insomnia, chest pain, palpitations, or SOB.    Tobacco abuse-she has not smoked a cigarette in almost a year since last February but she has been vaping. She is really excited!   Past Medical History:  Diagnosis Date  . ADD (attention deficit disorder)   . ADHD (attention deficit hyperactivity disorder)   . IUD    mirena  . Vaginal Pap smear, abnormal   . Viral gastroenteritis 07/12/2015    Past Surgical History:  Procedure Laterality Date  . CHOLECYSTECTOMY  2000    Family History  Problem Relation Age of Onset  . Diabetes Other     Social History   Socioeconomic History  . Marital status: Married    Spouse name: Harvie Heck  . Number of children: Not on file  . Years of education: Not on file  . Highest education level: Not on file  Occupational History  . Occupation: Land: [employer]  Tobacco Use  . Smoking status: Former Smoker    Types: Cigarettes    Quit date: 09/26/2019    Years since quitting: 0.9  . Smokeless tobacco: Never Used  . Tobacco comment: 3 cig per day. Doesnt smoke at home.   Substance and Sexual Activity  . Alcohol use: Yes    Alcohol/week: 0.0 standard drinks  . Drug use: No  . Sexual activity: Yes    Partners: Male    Birth control/protection: I.U.D.  Other Topics Concern  . Not on file  Social History Narrative   No regular execise.    Social Determinants of Health   Financial Resource Strain: Not on file  Food Insecurity: Not on file  Transportation Needs: Not on file  Physical Activity: Not on file  Stress: Not on file  Social Connections: Not on file  Intimate Partner  Violence: Not on file    Outpatient Medications Prior to Visit  Medication Sig Dispense Refill  . levonorgestrel (MIRENA) 20 MCG/24HR IUD 1 each by Intrauterine route once.    Marland Kitchen amphetamine-dextroamphetamine (ADDERALL) 20 MG tablet Take 1 tablet (20 mg total) by mouth 2 (two) times daily. 180 tablet 0   No facility-administered medications prior to visit.    Allergies  Allergen Reactions  . Penicillins Itching    ROS Review of Systems    Objective:    Physical Exam Constitutional:      Appearance: She is well-developed and well-nourished.  HENT:     Head: Normocephalic and atraumatic.  Cardiovascular:     Rate and Rhythm: Normal rate and regular rhythm.     Heart sounds: Murmur heard.      Comments: 2/6 SEM Pulmonary:     Effort: Pulmonary effort is normal.     Breath sounds: Normal breath sounds.  Skin:    General: Skin is warm and dry.  Neurological:     Mental Status: She is alert and oriented to person, place, and time.  Psychiatric:        Mood and Affect: Mood and affect normal.        Behavior: Behavior normal.     BP (!) 99/57  Pulse 66   Ht 5\' 5"  (1.651 m)   Wt 126 lb 1.6 oz (57.2 kg)   SpO2 99%   BMI 20.98 kg/m  Wt Readings from Last 3 Encounters:  08/24/20 126 lb 1.6 oz (57.2 kg)  06/07/20 120 lb (54.4 kg)  03/08/20 122 lb 9.6 oz (55.6 kg)     Health Maintenance Due  Topic Date Due  . Hepatitis C Screening  Never done  . INFLUENZA VACCINE  03/21/2020    There are no preventive care reminders to display for this patient.  Lab Results  Component Value Date   TSH 1.24 12/22/2019   Lab Results  Component Value Date   WBC 5.6 03/08/2020   HGB 12.4 03/08/2020   HCT 37.1 03/08/2020   MCV 93.5 03/08/2020   PLT 311 03/08/2020   Lab Results  Component Value Date   NA 139 03/08/2020   K 4.1 03/08/2020   CO2 30 03/08/2020   GLUCOSE 94 03/08/2020   BUN 13 03/08/2020   CREATININE 0.64 03/08/2020   BILITOT 0.5 03/08/2020   ALKPHOS  93 06/07/2016   AST 23 03/08/2020   ALT 18 03/08/2020   PROT 6.6 03/08/2020   ALBUMIN 4.5 06/07/2016   CALCIUM 8.8 03/08/2020   Lab Results  Component Value Date   CHOL 153 12/22/2019   Lab Results  Component Value Date   HDL 50 12/22/2019   Lab Results  Component Value Date   LDLCALC 90 12/22/2019   Lab Results  Component Value Date   TRIG 50 12/22/2019   Lab Results  Component Value Date   CHOLHDL 3.1 12/22/2019   No results found for: HGBA1C    Assessment & Plan:   Problem List Items Addressed This Visit      Other   Nicotine vapor product user - Primary   ADD (attention deficit disorder)    Well controlled. Continue current regimen. Follow up in  6 mo       Relevant Medications   amphetamine-dextroamphetamine (ADDERALL) 20 MG tablet      Encouraged her to continue to work on nicotine cessation. Encouraged regular exercise.    Meds ordered this encounter  Medications  . amphetamine-dextroamphetamine (ADDERALL) 20 MG tablet    Sig: Take 1 tablet (20 mg total) by mouth 2 (two) times daily.    Dispense:  180 tablet    Refill:  0    Follow-up: No follow-ups on file.    Beatrice Lecher, MD

## 2020-08-25 ENCOUNTER — Ambulatory Visit (INDEPENDENT_AMBULATORY_CARE_PROVIDER_SITE_OTHER): Payer: No Typology Code available for payment source | Admitting: Family Medicine

## 2020-08-25 ENCOUNTER — Other Ambulatory Visit: Payer: Self-pay | Admitting: *Deleted

## 2020-08-25 DIAGNOSIS — R52 Pain, unspecified: Secondary | ICD-10-CM

## 2020-08-25 DIAGNOSIS — R5081 Fever presenting with conditions classified elsewhere: Secondary | ICD-10-CM

## 2020-08-25 DIAGNOSIS — J101 Influenza due to other identified influenza virus with other respiratory manifestations: Secondary | ICD-10-CM

## 2020-08-25 LAB — POCT INFLUENZA A/B
Influenza A, POC: POSITIVE — AB
Influenza B, POC: POSITIVE — AB

## 2020-08-25 MED ORDER — OSELTAMIVIR PHOSPHATE 75 MG PO CAPS: 75.0000 mg | ORAL_CAPSULE | Freq: Two times a day (BID) | ORAL | 0 refills | Status: DC

## 2020-08-25 NOTE — Progress Notes (Signed)
Pt reports started not feeling well last night with bodyaches, fever and headaches.  Come in this morning for flu and COVID swab.  Rapid test positive for inlu A/B. Consider tx with Tamiflu.  Patient agreed to start Tamiflu.  New prescription sent to pharmacy.  She has had a flu vaccine.

## 2020-08-25 NOTE — Progress Notes (Signed)
Lab only 

## 2020-08-25 NOTE — Addendum Note (Signed)
Addended by: Nani Gasser D on: 08/25/2020 04:59 PM   Modules accepted: Orders

## 2020-08-28 LAB — NOVEL CORONAVIRUS, NAA

## 2020-10-19 ENCOUNTER — Other Ambulatory Visit: Payer: Self-pay

## 2020-10-19 ENCOUNTER — Ambulatory Visit (INDEPENDENT_AMBULATORY_CARE_PROVIDER_SITE_OTHER): Payer: No Typology Code available for payment source | Admitting: Family Medicine

## 2020-10-19 ENCOUNTER — Other Ambulatory Visit: Payer: Self-pay | Admitting: Family Medicine

## 2020-10-19 VITALS — BP 118/76 | HR 84 | Temp 97.4°F | Wt 122.0 lb

## 2020-10-19 DIAGNOSIS — R300 Dysuria: Secondary | ICD-10-CM | POA: Diagnosis not present

## 2020-10-19 LAB — POCT URINALYSIS DIP (CLINITEK)
Bilirubin, UA: NEGATIVE
Glucose, UA: NEGATIVE mg/dL
Ketones, POC UA: NEGATIVE mg/dL
Nitrite, UA: NEGATIVE
POC PROTEIN,UA: NEGATIVE
Spec Grav, UA: >=1.03 — AB (ref 1.010–1.025)
Urobilinogen, UA: 0.2 E.U./dL
pH, UA: 5.5 (ref 5.0–8.0)

## 2020-10-19 MED ORDER — SULFAMETHOXAZOLE-TRIMETHOPRIM 800-160 MG PO TABS: 1.0000 | ORAL_TABLET | Freq: Two times a day (BID) | ORAL | 0 refills | Status: DC

## 2020-10-19 MED FILL — SULFAMETHOXAZOLE-TMP DS TAB: 800-160 | 3 days supply | Qty: 6 | Fill #0

## 2020-10-19 NOTE — Progress Notes (Signed)
Established Patient Office Visit  Subjective:  Patient ID: Lauren Franklin, female    DOB: 1980/09/22  Age: 40 y.o. MRN: [id number]  CC:  Chief Complaint  Patient presents with  . Dysuria    HPI Lauren Franklin complains of dysuria for 1 day. Patient reports no recent antibiotic use or no recent catheterization. Patient has not taken Azo. Denies flank pain, pelvic pain, fever, chills or sweats.   Past Medical History:  Diagnosis Date  . ADD (attention deficit disorder)   . ADHD (attention deficit hyperactivity disorder)   . IUD    mirena  . Vaginal Pap smear, abnormal   . Viral gastroenteritis 07/12/2015    Past Surgical History:  Procedure Laterality Date  . CHOLECYSTECTOMY  2000    Family History  Problem Relation Age of Onset  . Diabetes Other     Social History   Socioeconomic History  . Marital status: Married    Spouse name: Lauren Franklin  . Number of children: Not on file  . Years of education: Not on file  . Highest education level: Not on file  Occupational History  . Occupation: Dispensing optician: [employer]  Tobacco Use  . Smoking status: Former Smoker    Types: Cigarettes    Quit date: 09/26/2019    Years since quitting: 1.0  . Smokeless tobacco: Never Used  . Tobacco comment: 3 cig per day. Doesnt smoke at home.   Substance and Sexual Activity  . Alcohol use: Yes    Alcohol/week: 0.0 standard drinks  . Drug use: No  . Sexual activity: Yes    Partners: Male    Birth control/protection: I.U.D.  Other Topics Concern  . Not on file  Social History Narrative   No regular execise.    Social Determinants of Health   Financial Resource Strain: Not on file  Food Insecurity: Not on file  Transportation Needs: Not on file  Physical Activity: Not on file  Stress: Not on file  Social Connections: Not on file  Intimate Partner Violence: Not on file    Outpatient Medications Prior to Visit  Medication Sig Dispense Refill   . amphetamine-dextroamphetamine (ADDERALL) 20 MG tablet Take 1 tablet (20 mg total) by mouth 2 (two) times daily. 180 tablet 0  . levonorgestrel (MIRENA) 20 MCG/24HR IUD 1 each by Intrauterine route once.    Marland Kitchen oseltamivir (TAMIFLU) 75 MG capsule Take 1 capsule (75 mg total) by mouth 2 (two) times daily. X 10 days 10 capsule 0   No facility-administered medications prior to visit.    Allergies  Allergen Reactions  . Penicillins Itching    ROS Review of Systems    Objective:    Physical Exam  BP 118/76   Pulse 84   Temp (!) 97.4 F (36.3 C) (Oral)   Wt 122 lb (55.3 kg)   SpO2 100%   BMI 20.30 kg/m  Wt Readings from Last 3 Encounters:  10/19/20 122 lb (55.3 kg)  08/24/20 126 lb 1.6 oz (57.2 kg)  06/07/20 120 lb (54.4 kg)     Health Maintenance Due  Topic Date Due  . Hepatitis C Screening  Never done    There are no preventive care reminders to display for this patient.  Lab Results  Component Value Date   TSH 1.24 12/22/2019   Lab Results  Component Value Date   WBC 5.6 03/08/2020   HGB 12.4 03/08/2020   HCT 37.1 03/08/2020  MCV 93.5 03/08/2020   PLT 311 03/08/2020   Lab Results  Component Value Date   NA 139 03/08/2020   K 4.1 03/08/2020   CO2 30 03/08/2020   GLUCOSE 94 03/08/2020   BUN 13 03/08/2020   CREATININE 0.64 03/08/2020   BILITOT 0.5 03/08/2020   ALKPHOS 93 06/07/2016   AST 23 03/08/2020   ALT 18 03/08/2020   PROT 6.6 03/08/2020   ALBUMIN 4.5 06/07/2016   CALCIUM 8.8 03/08/2020   Lab Results  Component Value Date   CHOL 153 12/22/2019   Lab Results  Component Value Date   HDL 50 12/22/2019   Lab Results  Component Value Date   LDLCALC 90 12/22/2019   Lab Results  Component Value Date   TRIG 50 12/22/2019   Lab Results  Component Value Date   CHOLHDL 3.1 12/22/2019   No results found for: HGBA1C    Assessment & Plan:  Dysuria -    Problem List Items Addressed This Visit   None   Visit Diagnoses    Dysuria     -  Primary   Relevant Orders   POCT URINALYSIS DIP (CLINITEK) (Completed)   Urine Culture      No orders of the defined types were placed in this encounter.   Follow-up: Return if symptoms worsen or fail to improve.    Lavell Luster, [place]

## 2020-10-19 NOTE — Progress Notes (Signed)
Dysuria-urinalysis showing leukocytes.  Since symptoms consistent with urinary tract infection we will go ahead and treat with Bactrim.  Call if not better after 3 days.  Make sure drinking plenty of water and emptying bladder regularly.

## 2020-10-21 LAB — URINE CULTURE
MICRO NUMBER:: 11594167
SPECIMEN QUALITY:: ADEQUATE

## 2020-12-08 ENCOUNTER — Ambulatory Visit (INDEPENDENT_AMBULATORY_CARE_PROVIDER_SITE_OTHER): Payer: No Typology Code available for payment source | Admitting: Family Medicine

## 2020-12-08 ENCOUNTER — Other Ambulatory Visit: Payer: Self-pay

## 2020-12-08 ENCOUNTER — Encounter: Payer: Self-pay | Admitting: Family Medicine

## 2020-12-08 ENCOUNTER — Other Ambulatory Visit (HOSPITAL_BASED_OUTPATIENT_CLINIC_OR_DEPARTMENT_OTHER): Payer: Self-pay

## 2020-12-08 VITALS — BP 103/69 | HR 97 | Temp 98.6°F | Ht 65.0 in | Wt 122.0 lb

## 2020-12-08 DIAGNOSIS — F988 Other specified behavioral and emotional disorders with onset usually occurring in childhood and adolescence: Secondary | ICD-10-CM

## 2020-12-08 DIAGNOSIS — J0100 Acute maxillary sinusitis, unspecified: Secondary | ICD-10-CM

## 2020-12-08 MED ORDER — AZITHROMYCIN 250 MG PO TABS
ORAL_TABLET | ORAL | 0 refills | Status: AC
  Filled 2020-12-08: qty 6, 5d supply, fill #0

## 2020-12-08 MED ORDER — AMPHETAMINE-DEXTROAMPHETAMINE 20 MG PO TABS
ORAL_TABLET | Freq: Two times a day (BID) | ORAL | 0 refills | Status: DC
  Filled 2020-12-08: qty 180, 90d supply, fill #0

## 2020-12-08 NOTE — Assessment & Plan Note (Signed)
Well controlled. Continue current regimen. Follow up in  6 months.  

## 2020-12-08 NOTE — Progress Notes (Signed)
Acute Office Visit  Subjective:    Patient ID: Lauren Franklin, female    DOB: 1981/07/29, 40 y.o.   MRN: [id number]  Chief Complaint  Patient presents with  . Sinus Problem   Ocean headache and low-grade HPI Patient is in today for sinus congestion, headache, facial pressure and pain x4 days.  She initially had some nasal symptoms that she thought was allergies but over the last 4 days it has progressed and gotten worse.  She said she had a low-grade temperature the other day.  She has a lot of maxillary bilateral facial pain and pressure.  She is taking ibuprofen and over-the-counter cough and cold medication.  She did do a COVID test and it was negative yesterday.  ADD - Reports symptoms are well controlled on current regime. Denies any problems with insomnia, chest pain, palpitations, or SOB.     Past Medical History:  Diagnosis Date  . ADD (attention deficit disorder)   . ADHD (attention deficit hyperactivity disorder)   . IUD    mirena  . Vaginal Pap smear, abnormal   . Viral gastroenteritis 07/12/2015    Past Surgical History:  Procedure Laterality Date  . CHOLECYSTECTOMY  2000    Family History  Problem Relation Age of Onset  . Diabetes Other     Social History   Socioeconomic History  . Marital status: Married    Spouse name: Louie Casa  . Number of children: Not on file  . Years of education: Not on file  . Highest education level: Not on file  Occupational History  . Occupation: Dispensing optician: [employer]  Tobacco Use  . Smoking status: Former Smoker    Types: Cigarettes    Quit date: 09/26/2019    Years since quitting: 1.2  . Smokeless tobacco: Never Used  . Tobacco comment: 3 cig per day. Doesnt smoke at home.   Substance and Sexual Activity  . Alcohol use: Yes    Alcohol/week: 0.0 standard drinks  . Drug use: No  . Sexual activity: Yes    Partners: Male    Birth control/protection: I.U.D.  Other Topics Concern  . Not  on file  Social History Narrative   No regular execise.    Social Determinants of Health   Financial Resource Strain: Not on file  Food Insecurity: Not on file  Transportation Needs: Not on file  Physical Activity: Not on file  Stress: Not on file  Social Connections: Not on file  Intimate Partner Violence: Not on file    Outpatient Medications Prior to Visit  Medication Sig Dispense Refill  . levonorgestrel (MIRENA) 20 MCG/24HR IUD 1 each by Intrauterine route once.    Marland Kitchen amphetamine-dextroamphetamine (ADDERALL) 20 MG tablet TAKE 1 TABLET BY MOUTH TWICE DAILY 180 tablet 0  . sulfamethoxazole-trimethoprim (BACTRIM DS) 800-160 MG tablet TAKE 1 TABLET BY MOUTH TWICE DAILY 6 tablet 0   No facility-administered medications prior to visit.    Allergies  Allergen Reactions  . Penicillins Itching    Review of Systems     Objective:    Physical Exam Constitutional:      Appearance: She is well-developed.  HENT:     Head: Normocephalic and atraumatic.     Right Ear: External ear normal.     Left Ear: External ear normal.     Nose: Nose normal.  Eyes:     Conjunctiva/sclera: Conjunctivae normal.     Pupils: Pupils are equal, round, and  reactive to light.  Neck:     Thyroid: No thyromegaly.  Cardiovascular:     Rate and Rhythm: Normal rate and regular rhythm.     Heart sounds: Normal heart sounds.  Pulmonary:     Effort: Pulmonary effort is normal.     Breath sounds: Normal breath sounds. No wheezing.  Musculoskeletal:     Cervical back: Neck supple.  Lymphadenopathy:     Cervical: No cervical adenopathy.  Skin:    General: Skin is warm and dry.  Neurological:     Mental Status: She is alert and oriented to person, place, and time.     BP 103/69   Pulse 97   Temp 98.6 F (37 C) (Oral)   Ht 5\' 5"  (1.651 m)   Wt 122 lb (55.3 kg)   SpO2 98%   BMI 20.30 kg/m  Wt Readings from Last 3 Encounters:  12/08/20 122 lb (55.3 kg)  10/19/20 122 lb (55.3 kg)  08/24/20  126 lb 1.6 oz (57.2 kg)    Health Maintenance Due  Topic Date Due  . Hepatitis C Screening  Never done    There are no preventive care reminders to display for this patient.   Lab Results  Component Value Date   TSH 1.24 12/22/2019   Lab Results  Component Value Date   WBC 5.6 03/08/2020   HGB 12.4 03/08/2020   HCT 37.1 03/08/2020   MCV 93.5 03/08/2020   PLT 311 03/08/2020   Lab Results  Component Value Date   NA 139 03/08/2020   K 4.1 03/08/2020   CO2 30 03/08/2020   GLUCOSE 94 03/08/2020   BUN 13 03/08/2020   CREATININE 0.64 03/08/2020   BILITOT 0.5 03/08/2020   ALKPHOS 93 06/07/2016   AST 23 03/08/2020   ALT 18 03/08/2020   PROT 6.6 03/08/2020   ALBUMIN 4.5 06/07/2016   CALCIUM 8.8 03/08/2020   Lab Results  Component Value Date   CHOL 153 12/22/2019   Lab Results  Component Value Date   HDL 50 12/22/2019   Lab Results  Component Value Date   LDLCALC 90 12/22/2019   Lab Results  Component Value Date   TRIG 50 12/22/2019   Lab Results  Component Value Date   CHOLHDL 3.1 12/22/2019   No results found for: HGBA1C     Assessment & Plan:   Problem List Items Addressed This Visit      Other   ADD (attention deficit disorder)    Well controlled. Continue current regimen. Follow up in  6 months.        Relevant Medications   amphetamine-dextroamphetamine (ADDERALL) 20 MG tablet    Other Visit Diagnoses    Acute non-recurrent maxillary sinusitis    -  Primary   Relevant Medications   azithromycin (ZITHROMAX) 250 MG tablet     Acute sinusitis-we will treat with azithromycin since that is penicillin allergy.  Recommend nasal saline irrigation twice a day she also picked up some Afrin okay to use at bedtime but not to use for more than 4 to 5 days.  If she is using it twice a day then do not use more than 3 consecutive days.  Follow-up if not better in 1 week.   Meds ordered this encounter  Medications  . amphetamine-dextroamphetamine  (ADDERALL) 20 MG tablet    Sig: TAKE 1 TABLET BY MOUTH TWICE DAILY    Dispense:  180 tablet    Refill:  0  . azithromycin (ZITHROMAX)  250 MG tablet    Sig: 2 Ttabs PO on Day 1, then one a day x 4 days.    Dispense:  6 tablet    Refill:  0     Beatrice Lecher, MD

## 2021-02-14 ENCOUNTER — Encounter: Payer: Self-pay | Admitting: Family Medicine

## 2021-02-14 ENCOUNTER — Ambulatory Visit (INDEPENDENT_AMBULATORY_CARE_PROVIDER_SITE_OTHER): Payer: No Typology Code available for payment source | Admitting: Family Medicine

## 2021-02-14 VITALS — BP 96/60 | HR 80 | Ht 65.0 in | Wt 124.0 lb

## 2021-02-14 DIAGNOSIS — Z0000 Encounter for general adult medical examination without abnormal findings: Secondary | ICD-10-CM | POA: Diagnosis not present

## 2021-02-14 LAB — COMPLETE METABOLIC PANEL WITH GFR
AG Ratio: 1.6 (calc) (ref 1.0–2.5)
ALT: 8 U/L (ref 6–29)
AST: 14 U/L (ref 10–30)
Albumin: 4.4 g/dL (ref 3.6–5.1)
Alkaline phosphatase (APISO): 78 U/L (ref 31–125)
BUN: 15 mg/dL (ref 7–25)
CO2: 26 mmol/L (ref 20–32)
Calcium: 9 mg/dL (ref 8.6–10.2)
Chloride: 105 mmol/L (ref 98–110)
Creat: 0.73 mg/dL (ref 0.50–1.10)
GFR, Est African American: 119 mL/min/{1.73_m2} (ref >=60)
GFR, Est Non African American: 103 mL/min/{1.73_m2} (ref >=60)
Globulin: 2.8 g/dL (calc) (ref 1.9–3.7)
Glucose, Bld: 80 mg/dL (ref 65–99)
Potassium: 4.2 mmol/L (ref 3.5–5.3)
Sodium: 138 mmol/L (ref 135–146)
Total Bilirubin: 0.7 mg/dL (ref 0.2–1.2)
Total Protein: 7.2 g/dL (ref 6.1–8.1)

## 2021-02-14 LAB — LIPID PANEL
Cholesterol: 160 mg/dL (ref <200)
HDL: 60 mg/dL (ref >=50)
LDL Cholesterol (Calc): 86 mg/dL (calc)
Non-HDL Cholesterol (Calc): 100 mg/dL (calc) (ref <130)
Total CHOL/HDL Ratio: 2.7 (calc) (ref <5.0)
Triglycerides: 53 mg/dL (ref <150)

## 2021-02-14 LAB — CBC
HCT: 36.1 % (ref 35.0–45.0)
Hemoglobin: 12 g/dL (ref 11.7–15.5)
MCH: 31.3 pg (ref 27.0–33.0)
MCHC: 33.2 g/dL (ref 32.0–36.0)
MCV: 94 fL (ref 80.0–100.0)
MPV: 10.2 fL (ref 7.5–12.5)
Platelets: 308 10*3/uL (ref 140–400)
RBC: 3.84 10*6/uL (ref 3.80–5.10)
RDW: 12.7 % (ref 11.0–15.0)
WBC: 8.1 10*3/uL (ref 3.8–10.8)

## 2021-02-14 NOTE — Progress Notes (Signed)
Subjective:     Lauren Franklin is a 40 y.o. female and is here for a comprehensive physical exam. The patient reports no problems.  Social History   Socioeconomic History   Marital status: Married    Spouse name: Louie Casa   Number of children: Not on file   Years of education: Not on file   Highest education level: Not on file  Occupational History   Occupation: Dispensing optician: [employer]  Tobacco Use   Smoking status: Former    Pack years: 0.00    Types: Cigarettes    Quit date: 09/26/2019    Years since quitting: 1.3   Smokeless tobacco: Never   Tobacco comments:    3 cig per day. Doesnt smoke at home.   Substance and Sexual Activity   Alcohol use: Yes    Alcohol/week: 0.0 standard drinks   Drug use: No   Sexual activity: Yes    Partners: Male    Birth control/protection: I.U.D.  Other Topics Concern   Not on file  Social History Narrative   No regular execise.    Social Determinants of Health   Financial Resource Strain: Not on file  Food Insecurity: Not on file  Transportation Needs: Not on file  Physical Activity: Not on file  Stress: Not on file  Social Connections: Not on file  Intimate Partner Violence: Not on file   Health Maintenance  Topic Date Due   COVID-19 Vaccine (1) Never done   HIV Screening  Never done   Hepatitis C Screening  Never done   INFLUENZA VACCINE  03/21/2021   PAP SMEAR-Modifier  03/19/2022   TETANUS/TDAP  07/17/2023   Pneumococcal Vaccine 67-48 Years old  Aged Out   HPV VACCINES  Aged Out    The following portions of the patient's history were reviewed and updated as appropriate: allergies, current medications, past family history, past medical history, past social history, past surgical history, and problem list.  Review of Systems Pertinent items are noted in HPI.   Objective:   .complete physical examination Physical Exam Exam conducted with a chaperone present.  Constitutional:       Appearance: She is well-developed.  HENT:     Head: Normocephalic and atraumatic.     Right Ear: External ear normal.     Left Ear: External ear normal.     Nose: Nose normal.  Eyes:     Conjunctiva/sclera: Conjunctivae normal.     Pupils: Pupils are equal, round, and reactive to light.  Neck:     Thyroid: No thyromegaly.  Cardiovascular:     Rate and Rhythm: Normal rate and regular rhythm.     Heart sounds: Normal heart sounds.  Pulmonary:     Effort: Pulmonary effort is normal.     Breath sounds: Normal breath sounds. No wheezing.  Chest:  Breasts:    Right: Normal. No axillary adenopathy or supraclavicular adenopathy.     Left: Normal. No axillary adenopathy or supraclavicular adenopathy.  Abdominal:     General: Abdomen is flat.     Palpations: Abdomen is soft.  Musculoskeletal:     Cervical back: Neck supple.  Lymphadenopathy:     Cervical: No cervical adenopathy.     Upper Body:     Right upper body: No supraclavicular, axillary or pectoral adenopathy.     Left upper body: No supraclavicular, axillary or pectoral adenopathy.  Skin:    General: Skin is warm and dry.  Neurological:     Mental Status: She is alert and oriented to person, place, and time.        Assessment:    Healthy female exam.      Plan:     See After Visit Summary for Counseling Recommendations  Keep up a regular exercise program and make sure you are eating a healthy diet Try to eat 4 servings of dairy a day, or if you are lactose intolerant take a calcium with vitamin D daily.  Your vaccines are up to date.  Declined mammogram today.  Will get up to date labs.

## 2021-03-24 ENCOUNTER — Other Ambulatory Visit: Payer: Self-pay

## 2021-03-24 ENCOUNTER — Other Ambulatory Visit (HOSPITAL_BASED_OUTPATIENT_CLINIC_OR_DEPARTMENT_OTHER): Payer: Self-pay

## 2021-03-24 DIAGNOSIS — F988 Other specified behavioral and emotional disorders with onset usually occurring in childhood and adolescence: Secondary | ICD-10-CM

## 2021-03-24 MED ORDER — AMPHETAMINE-DEXTROAMPHETAMINE 20 MG PO TABS
ORAL_TABLET | Freq: Two times a day (BID) | ORAL | 0 refills | Status: DC
  Filled 2021-03-24: qty 180, 90d supply, fill #0

## 2021-04-08 ENCOUNTER — Other Ambulatory Visit: Payer: Self-pay | Admitting: Sports Medicine

## 2021-04-08 ENCOUNTER — Other Ambulatory Visit (HOSPITAL_BASED_OUTPATIENT_CLINIC_OR_DEPARTMENT_OTHER): Payer: Self-pay

## 2021-04-08 MED ORDER — FLUCONAZOLE 150 MG PO TABS
150.0000 mg | ORAL_TABLET | Freq: Once | ORAL | 0 refills | Status: AC
  Filled 2021-04-08: qty 8, 30d supply, fill #0

## 2021-04-08 NOTE — Telephone Encounter (Signed)
Patient gets yeast infections with antibiotics, would like some Diflucan called in.

## 2021-04-19 ENCOUNTER — Telehealth: Payer: Self-pay | Admitting: Sports Medicine

## 2021-04-19 DIAGNOSIS — R0989 Other specified symptoms and signs involving the circulatory and respiratory systems: Secondary | ICD-10-CM | POA: Insufficient documentation

## 2021-04-19 NOTE — Telephone Encounter (Signed)
Choking sensation This is a pleasant 40 year old female, over the past few weeks she has noted increasing choking sensations particularly when trying to drink water, this triggers significant anxiety. She is able to eat solid foods without much problem. No history of asthma, no symptoms of acid reflux. 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

## 2021-04-19 NOTE — Assessment & Plan Note (Signed)
This is a pleasant 40 year old female, over the past few weeks she has noted increasing choking sensations particularly when trying to drink water, this triggers significant anxiety. She is able to eat solid foods without much problem. No history of asthma, no symptoms of acid reflux. 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

## 2021-04-21 DIAGNOSIS — U071 COVID-19: Secondary | ICD-10-CM

## 2021-04-21 HISTORY — DX: COVID-19: U07.1

## 2021-04-22 ENCOUNTER — Telehealth (HOSPITAL_COMMUNITY): Payer: Self-pay

## 2021-04-22 NOTE — Telephone Encounter (Signed)
Called and spoke with patient to schedule OP MBS - patient stated she is holding off on scheduling to see if she has more episodes swallowing as they only happen occasionally. Will follow up in 2 weeks if have not heard from patient.

## 2021-05-10 ENCOUNTER — Telehealth (HOSPITAL_COMMUNITY): Payer: Self-pay

## 2021-05-10 NOTE — Telephone Encounter (Signed)
Called to follow up with patient to schedule OP MBS - patient stated she is going to hold off. If anything changes with patient, MD will place new order. Close order.

## 2021-06-27 ENCOUNTER — Other Ambulatory Visit: Payer: Self-pay

## 2021-06-27 DIAGNOSIS — F988 Other specified behavioral and emotional disorders with onset usually occurring in childhood and adolescence: Secondary | ICD-10-CM

## 2021-06-27 NOTE — Telephone Encounter (Signed)
Due for refill. Has appointment in December as directed.

## 2021-06-28 ENCOUNTER — Other Ambulatory Visit (HOSPITAL_BASED_OUTPATIENT_CLINIC_OR_DEPARTMENT_OTHER): Payer: Self-pay

## 2021-06-28 MED ORDER — AMPHETAMINE-DEXTROAMPHETAMINE 20 MG PO TABS
ORAL_TABLET | Freq: Two times a day (BID) | ORAL | 0 refills | Status: DC
  Filled 2021-06-28: qty 180, 90d supply, fill #0

## 2021-08-16 ENCOUNTER — Ambulatory Visit: Payer: No Typology Code available for payment source | Admitting: Family Medicine

## 2021-08-17 ENCOUNTER — Other Ambulatory Visit (HOSPITAL_BASED_OUTPATIENT_CLINIC_OR_DEPARTMENT_OTHER): Payer: Self-pay

## 2021-08-17 ENCOUNTER — Ambulatory Visit (INDEPENDENT_AMBULATORY_CARE_PROVIDER_SITE_OTHER): Payer: No Typology Code available for payment source | Admitting: Family Medicine

## 2021-08-17 ENCOUNTER — Encounter: Payer: Self-pay | Admitting: Family Medicine

## 2021-08-17 ENCOUNTER — Other Ambulatory Visit: Payer: Self-pay

## 2021-08-17 VITALS — BP 95/61 | HR 76 | Ht 65.0 in | Wt 117.0 lb

## 2021-08-17 DIAGNOSIS — F988 Other specified behavioral and emotional disorders with onset usually occurring in childhood and adolescence: Secondary | ICD-10-CM

## 2021-08-17 MED ORDER — AMPHETAMINE-DEXTROAMPHETAMINE 20 MG PO TABS
ORAL_TABLET | Freq: Two times a day (BID) | ORAL | 0 refills | Status: DC
  Filled 2021-08-17: qty 180, fill #0
  Filled 2021-10-14: qty 180, 90d supply, fill #0

## 2021-08-17 NOTE — Assessment & Plan Note (Signed)
Well controlled. Continue current regimen. Follow up in  6 mo  

## 2021-08-17 NOTE — Progress Notes (Signed)
Established Patient Office Visit  Subjective:  Patient ID: Lauren Franklin, female    DOB: December 07, 1980  Age: 40 y.o. MRN: [id number]  CC:  Chief Complaint  Patient presents with   ADD    HPI Lauren Franklin presents for   ADD - Reports symptoms are well controlled on current regime. Denies any problems with insomnia, chest pain, palpitations, or SOB.     Past Medical History:  Diagnosis Date   ADD (attention deficit disorder)    ADHD (attention deficit hyperactivity disorder)    IUD    mirena   Vaginal Pap smear, abnormal    Viral gastroenteritis 07/12/2015    Past Surgical History:  Procedure Laterality Date   CHOLECYSTECTOMY  2000    Family History  Problem Relation Age of Onset   Diabetes Other     Social History   Socioeconomic History   Marital status: Married    Spouse name: Louie Casa   Number of children: Not on file   Years of education: Not on file   Highest education level: Not on file  Occupational History   Occupation: Geneticist, molecular    Employer: [employer]  Tobacco Use   Smoking status: Former    Types: Cigarettes    Quit date: 09/26/2019    Years since quitting: 1.8   Smokeless tobacco: Never   Tobacco comments:    3 cig per day. Doesnt smoke at home.   Substance and Sexual Activity   Alcohol use: Yes    Alcohol/week: 0.0 standard drinks   Drug use: No   Sexual activity: Yes    Partners: Male    Birth control/protection: I.U.D.  Other Topics Concern   Not on file  Social History Narrative   No regular execise.    Social Determinants of Health   Financial Resource Strain: Not on file  Food Insecurity: Not on file  Transportation Needs: Not on file  Physical Activity: Not on file  Stress: Not on file  Social Connections: Not on file  Intimate Partner Violence: Not on file    Outpatient Medications Prior to Visit  Medication Sig Dispense Refill   levonorgestrel (MIRENA) 20 MCG/24HR IUD 1 each by Intrauterine  route once.     amphetamine-dextroamphetamine (ADDERALL) 20 MG tablet TAKE 1 TABLET BY MOUTH TWICE DAILY 180 tablet 0   No facility-administered medications prior to visit.    Allergies  Allergen Reactions   Penicillins Itching    ROS Review of Systems    Objective:    Physical Exam Constitutional:      Appearance: Normal appearance. She is well-developed.  HENT:     Head: Normocephalic and atraumatic.  Cardiovascular:     Rate and Rhythm: Normal rate and regular rhythm.     Heart sounds: Normal heart sounds.  Pulmonary:     Effort: Pulmonary effort is normal.     Breath sounds: Normal breath sounds.  Skin:    General: Skin is warm and dry.  Neurological:     Mental Status: She is alert and oriented to person, place, and time.  Psychiatric:        Behavior: Behavior normal.    BP 95/61    Pulse 76    Ht 5\' 5"  (1.651 m)    Wt 117 lb (53.1 kg)    SpO2 100%    BMI 19.47 kg/m  Wt Readings from Last 3 Encounters:  08/17/21 117 lb (53.1 kg)  02/14/21 124 lb (56.2 kg)  12/08/20 122 lb (55.3 kg)     There are no preventive care reminders to display for this patient.  There are no preventive care reminders to display for this patient.  Lab Results  Component Value Date   TSH 1.24 12/22/2019   Lab Results  Component Value Date   WBC 8.1 02/14/2021   HGB 12.0 02/14/2021   HCT 36.1 02/14/2021   MCV 94.0 02/14/2021   PLT 308 02/14/2021   Lab Results  Component Value Date   NA 138 02/14/2021   K 4.2 02/14/2021   CO2 26 02/14/2021   GLUCOSE 80 02/14/2021   BUN 15 02/14/2021   CREATININE 0.73 02/14/2021   BILITOT 0.7 02/14/2021   ALKPHOS 93 06/07/2016   AST 14 02/14/2021   ALT 8 02/14/2021   PROT 7.2 02/14/2021   ALBUMIN 4.5 06/07/2016   CALCIUM 9.0 02/14/2021   Lab Results  Component Value Date   CHOL 160 02/14/2021   Lab Results  Component Value Date   HDL 60 02/14/2021   Lab Results  Component Value Date   LDLCALC 86 02/14/2021   Lab Results   Component Value Date   TRIG 53 02/14/2021   Lab Results  Component Value Date   CHOLHDL 2.7 02/14/2021   No results found for: HGBA1C    Assessment & Plan:   Problem List Items Addressed This Visit       Other   ADD (attention deficit disorder) - Primary    Well controlled. Continue current regimen. Follow up in  21mo       Relevant Medications   amphetamine-dextroamphetamine (ADDERALL) 20 MG tablet    Meds ordered this encounter  Medications   amphetamine-dextroamphetamine (ADDERALL) 20 MG tablet    Sig: TAKE 1 TABLET BY MOUTH TWICE DAILY    Dispense:  180 tablet    Refill:  0    Follow-up: No follow-ups on file.    Beatrice Lecher, MD

## 2021-08-23 ENCOUNTER — Other Ambulatory Visit (HOSPITAL_BASED_OUTPATIENT_CLINIC_OR_DEPARTMENT_OTHER): Payer: Self-pay

## 2021-09-29 ENCOUNTER — Other Ambulatory Visit (HOSPITAL_BASED_OUTPATIENT_CLINIC_OR_DEPARTMENT_OTHER): Payer: Self-pay

## 2021-09-29 MED ORDER — CARESTART COVID-19 HOME TEST VI KIT
PACK | 0 refills | Status: DC
  Filled 2021-09-29: qty 2, 4d supply, fill #0

## 2021-10-06 ENCOUNTER — Other Ambulatory Visit: Payer: Self-pay

## 2021-10-14 ENCOUNTER — Other Ambulatory Visit (HOSPITAL_BASED_OUTPATIENT_CLINIC_OR_DEPARTMENT_OTHER): Payer: Self-pay

## 2021-11-10 ENCOUNTER — Ambulatory Visit: Payer: No Typology Code available for payment source | Admitting: Obstetrics and Gynecology

## 2021-11-10 NOTE — Progress Notes (Signed)
? ? ?  GYNECOLOGY OFFICE PROCEDURE NOTE ? ?Lauren Franklin is a 41 y.o. [phone] here for IUD insertion. No GYN concerns.  Last pap smear was on 2020 and was normal. ? ?IUD Insertion Procedure Note ?Procedure: IUD removal and reinsertion with Mirena ?UPT:  not indicated - IUD removal as well ?GC/CT testing: Offered and declines ? ?Patient identified.  Risks, benefits and alternatives of procedure were discussed including irregular bleeding, cramping, infection, malpositioning or misplacement of the IUD outside the uterus which may require further procedure such as laparoscopy. Also discussed >99% contraception efficacy, increased risk of ectopic pregnancy with failure of method.   Emphasized that this did not protect against STIs, condoms recommended during all sexual encounters. Consent signed. Time out performed.  ? ?Speculum inserted and cervix visualized. The strings of the IUD were grasped and pulled using ring forceps. The IUD was successfully removed in its entirety.  ? ?Cervix prepped with 3 swabs of betadine.  ? ?Grasped with a single tooth tenaculum. Uterus sounded to 8 cm. Cervical dilators used.  IUD then inserted without difficulty once dilated to 35 [place] but prior attempts met significant resistance. Insertion per manufacturer's instructions and strings cut to 3 cm below cervical os and all instruments removed. Pt tolerated well with minimal pain and bleeding.  ? ?Discussed concerning signs/symptoms and to call if heavy bleeding, severe abdominal pain, or fever in the following 3 weeks. Manufacturer pamphlet/patient information given. Reviewed timing of efficacy for contraception and to use an alternative form of birth control until that time. ? ? ?Radene Gunning, MD, FACOG ?Obstetrician Social research officer, government, Faculty Practice ?[facility] ? ?

## 2021-11-14 ENCOUNTER — Encounter: Payer: Self-pay | Admitting: Obstetrics and Gynecology

## 2021-11-14 ENCOUNTER — Ambulatory Visit (INDEPENDENT_AMBULATORY_CARE_PROVIDER_SITE_OTHER): Payer: No Typology Code available for payment source | Admitting: Obstetrics and Gynecology

## 2021-11-14 ENCOUNTER — Other Ambulatory Visit: Payer: Self-pay

## 2021-11-14 VITALS — BP 141/92 | HR 99 | Ht 64.0 in | Wt 120.0 lb

## 2021-11-14 DIAGNOSIS — Z30430 Encounter for insertion of intrauterine contraceptive device: Secondary | ICD-10-CM | POA: Diagnosis not present

## 2021-11-14 DIAGNOSIS — Z30432 Encounter for removal of intrauterine contraceptive device: Secondary | ICD-10-CM

## 2021-11-14 MED ORDER — LEVONORGESTREL 20 MCG/DAY IU IUD
1.0000 | INTRAUTERINE_SYSTEM | Freq: Once | INTRAUTERINE | Status: AC
  Administered 2021-11-14: 1 via INTRAUTERINE

## 2021-12-19 ENCOUNTER — Encounter (HOSPITAL_COMMUNITY): Payer: Self-pay

## 2021-12-19 ENCOUNTER — Ambulatory Visit (INDEPENDENT_AMBULATORY_CARE_PROVIDER_SITE_OTHER): Payer: No Typology Code available for payment source | Admitting: Family Medicine

## 2021-12-19 ENCOUNTER — Encounter: Payer: Self-pay | Admitting: Family Medicine

## 2021-12-19 ENCOUNTER — Ambulatory Visit (INDEPENDENT_AMBULATORY_CARE_PROVIDER_SITE_OTHER): Payer: No Typology Code available for payment source

## 2021-12-19 ENCOUNTER — Other Ambulatory Visit: Payer: Self-pay

## 2021-12-19 ENCOUNTER — Inpatient Hospital Stay (HOSPITAL_COMMUNITY)
Admission: EM | Admit: 2021-12-19 | Discharge: 2021-12-22 | DRG: 392 | Disposition: A | Discharge status: Home / Self Care | Payer: No Typology Code available for payment source | Attending: Internal Medicine | Admitting: Internal Medicine

## 2021-12-19 VITALS — BP 109/66 | HR 117 | Wt 118.0 lb

## 2021-12-19 DIAGNOSIS — Z20822 Contact with and (suspected) exposure to covid-19: Secondary | ICD-10-CM | POA: Diagnosis present

## 2021-12-19 DIAGNOSIS — R7401 Elevation of levels of liver transaminase levels: Secondary | ICD-10-CM | POA: Diagnosis present

## 2021-12-19 DIAGNOSIS — N854 Malposition of uterus: Secondary | ICD-10-CM | POA: Diagnosis present

## 2021-12-19 DIAGNOSIS — K5732 Diverticulitis of large intestine without perforation or abscess without bleeding: Secondary | ICD-10-CM | POA: Diagnosis not present

## 2021-12-19 DIAGNOSIS — K5792 Diverticulitis of intestine, part unspecified, without perforation or abscess without bleeding: Secondary | ICD-10-CM

## 2021-12-19 DIAGNOSIS — R1032 Left lower quadrant pain: Secondary | ICD-10-CM

## 2021-12-19 DIAGNOSIS — Z8619 Personal history of other infectious and parasitic diseases: Secondary | ICD-10-CM

## 2021-12-19 DIAGNOSIS — Z9049 Acquired absence of other specified parts of digestive tract: Secondary | ICD-10-CM

## 2021-12-19 DIAGNOSIS — F909 Attention-deficit hyperactivity disorder, unspecified type: Secondary | ICD-10-CM | POA: Diagnosis present

## 2021-12-19 DIAGNOSIS — Z975 Presence of (intrauterine) contraceptive device: Secondary | ICD-10-CM

## 2021-12-19 DIAGNOSIS — K529 Noninfective gastroenteritis and colitis, unspecified: Secondary | ICD-10-CM | POA: Diagnosis present

## 2021-12-19 DIAGNOSIS — D270 Benign neoplasm of right ovary: Secondary | ICD-10-CM | POA: Diagnosis present

## 2021-12-19 DIAGNOSIS — Z880 Allergy status to penicillin: Secondary | ICD-10-CM

## 2021-12-19 DIAGNOSIS — K5780 Diverticulitis of intestine, part unspecified, with perforation and abscess without bleeding: Principal | ICD-10-CM

## 2021-12-19 DIAGNOSIS — Z87891 Personal history of nicotine dependence: Secondary | ICD-10-CM

## 2021-12-19 DIAGNOSIS — R7989 Other specified abnormal findings of blood chemistry: Secondary | ICD-10-CM | POA: Diagnosis present

## 2021-12-19 DIAGNOSIS — F988 Other specified behavioral and emotional disorders with onset usually occurring in childhood and adolescence: Secondary | ICD-10-CM | POA: Diagnosis present

## 2021-12-19 DIAGNOSIS — K5720 Diverticulitis of large intestine with perforation and abscess without bleeding: Secondary | ICD-10-CM | POA: Diagnosis not present

## 2021-12-19 DIAGNOSIS — Z793 Long term (current) use of hormonal contraceptives: Secondary | ICD-10-CM

## 2021-12-19 LAB — CBC WITH DIFFERENTIAL/PLATELET
Abs Immature Granulocytes: 0.09 10*3/uL — ABNORMAL HIGH (ref 0.00–0.07)
Basophils Absolute: 0 10*3/uL (ref 0.0–0.1)
Basophils Relative: 0 %
Eosinophils Absolute: 0.2 10*3/uL (ref 0.0–0.5)
Eosinophils Relative: 1 %
HCT: 35 % — ABNORMAL LOW (ref 36.0–46.0)
Hemoglobin: 12.3 g/dL (ref 12.0–15.0)
Immature Granulocytes: 1 %
Lymphocytes Relative: 11 %
Lymphs Abs: 2 10*3/uL (ref 0.7–4.0)
MCH: 32.7 pg (ref 26.0–34.0)
MCHC: 35.1 g/dL (ref 30.0–36.0)
MCV: 93.1 fL (ref 80.0–100.0)
Monocytes Absolute: 1.5 10*3/uL — ABNORMAL HIGH (ref 0.1–1.0)
Monocytes Relative: 8 %
Neutro Abs: 14.3 10*3/uL — ABNORMAL HIGH (ref 1.7–7.7)
Neutrophils Relative %: 79 %
Platelets: 273 10*3/uL (ref 150–400)
RBC: 3.76 MIL/uL — ABNORMAL LOW (ref 3.87–5.11)
RDW: 13.1 % (ref 11.5–15.5)
WBC: 18.1 10*3/uL — ABNORMAL HIGH (ref 4.0–10.5)
nRBC: 0 % (ref 0.0–0.2)

## 2021-12-19 LAB — LACTIC ACID, PLASMA
Lactic Acid, Venous: 0.9 mmol/L (ref 0.5–1.9)
Lactic Acid, Venous: 0.7 mmol/L (ref 0.5–1.9)

## 2021-12-19 LAB — COMPREHENSIVE METABOLIC PANEL
ALT: 67 U/L — ABNORMAL HIGH (ref 0–44)
AST: 81 U/L — ABNORMAL HIGH (ref 15–41)
Albumin: 4.2 g/dL (ref 3.5–5.0)
Alkaline Phosphatase: 100 U/L (ref 38–126)
Anion gap: 7 (ref 5–15)
BUN: 9 mg/dL (ref 6–20)
CO2: 24 mmol/L (ref 22–32)
Calcium: 8.8 mg/dL — ABNORMAL LOW (ref 8.9–10.3)
Chloride: 105 mmol/L (ref 98–111)
Creatinine, Ser: 0.67 mg/dL (ref 0.44–1.00)
GFR, Estimated: >60 mL/min (ref >60)
Glucose, Bld: 100 mg/dL — ABNORMAL HIGH (ref 70–99)
Potassium: 3.6 mmol/L (ref 3.5–5.1)
Sodium: 136 mmol/L (ref 135–145)
Total Bilirubin: 1.1 mg/dL (ref 0.3–1.2)
Total Protein: 7.7 g/dL (ref 6.5–8.1)

## 2021-12-19 LAB — URINALYSIS, ROUTINE W REFLEX MICROSCOPIC
Bilirubin Urine: NEGATIVE
Glucose, UA: NEGATIVE mg/dL
Ketones, ur: 20 mg/dL — AB
Leukocytes,Ua: NEGATIVE
Nitrite: NEGATIVE
Protein, ur: NEGATIVE mg/dL
Specific Gravity, Urine: 1.024 (ref 1.005–1.030)
pH: 6 (ref 5.0–8.0)

## 2021-12-19 LAB — I-STAT BETA HCG BLOOD, ED (MC, WL, AP ONLY): I-stat hCG, quantitative: <5 m[IU]/mL (ref <5)

## 2021-12-19 LAB — RESP PANEL BY RT-PCR (FLU A&B, COVID) ARPGX2
Influenza A by PCR: NEGATIVE
Influenza B by PCR: NEGATIVE
SARS Coronavirus 2 by RT PCR: NEGATIVE

## 2021-12-19 MED ORDER — SODIUM CHLORIDE 0.9 % IV SOLN: 2.0000 g | Freq: Once | INTRAVENOUS | Status: DC

## 2021-12-19 MED ORDER — LACTATED RINGERS IV SOLN: INTRAVENOUS | Status: AC

## 2021-12-19 MED ORDER — SODIUM CHLORIDE 0.9 % IV SOLN
2.0000 g | INTRAVENOUS | Status: DC
  Administered 2021-12-20 – 2021-12-22 (×3): 2 g via INTRAVENOUS
  Filled 2021-12-19 (×3): qty 20

## 2021-12-19 MED ORDER — METRONIDAZOLE 500 MG/100ML IV SOLN
500.0000 mg | Freq: Once | INTRAVENOUS | Status: AC
  Administered 2021-12-19: 500 mg via INTRAVENOUS
  Filled 2021-12-19: qty 100

## 2021-12-19 MED ORDER — SODIUM CHLORIDE 0.9 % IV BOLUS
1000.0000 mL | Freq: Once | INTRAVENOUS | Status: AC
  Administered 2021-12-19: 1000 mL via INTRAVENOUS

## 2021-12-19 MED ORDER — ACETAMINOPHEN 650 MG RE SUPP: 650.0000 mg | Freq: Four times a day (QID) | RECTAL | Status: DC | PRN

## 2021-12-19 MED ORDER — SODIUM CHLORIDE 0.9 % IV SOLN
2.0000 g | Freq: Once | INTRAVENOUS | Status: AC
  Administered 2021-12-19: 2 g via INTRAVENOUS
  Filled 2021-12-19: qty 12.5

## 2021-12-19 MED ORDER — FENTANYL CITRATE PF 50 MCG/ML IJ SOSY
50.0000 ug | PREFILLED_SYRINGE | Freq: Once | INTRAMUSCULAR | Status: AC
  Administered 2021-12-19: 50 ug via INTRAVENOUS
  Filled 2021-12-19: qty 1

## 2021-12-19 MED ORDER — IOHEXOL 300 MG/ML  SOLN
100.0000 mL | Freq: Once | INTRAMUSCULAR | Status: AC | PRN
  Administered 2021-12-19: 100 mL via INTRAVENOUS

## 2021-12-19 MED ORDER — METRONIDAZOLE 500 MG/100ML IV SOLN
500.0000 mg | Freq: Two times a day (BID) | INTRAVENOUS | Status: DC
  Administered 2021-12-20 – 2021-12-22 (×5): 500 mg via INTRAVENOUS
  Filled 2021-12-19 (×5): qty 100

## 2021-12-19 MED ORDER — MORPHINE SULFATE (PF) 2 MG/ML IV SOLN
1.0000 mg | INTRAVENOUS | Status: DC | PRN
  Administered 2021-12-20 (×2): 1 mg via INTRAVENOUS
  Filled 2021-12-19 (×2): qty 1

## 2021-12-19 MED ORDER — ACETAMINOPHEN 325 MG PO TABS
650.0000 mg | ORAL_TABLET | Freq: Four times a day (QID) | ORAL | Status: DC | PRN
  Administered 2021-12-21: 650 mg via ORAL
  Filled 2021-12-19: qty 2

## 2021-12-19 MED ORDER — ONDANSETRON HCL 4 MG/2ML IJ SOLN: 4.0000 mg | Freq: Four times a day (QID) | INTRAMUSCULAR | Status: DC | PRN

## 2021-12-19 MED ORDER — ONDANSETRON HCL 4 MG PO TABS: 4.0000 mg | ORAL_TABLET | Freq: Four times a day (QID) | ORAL | Status: DC | PRN

## 2021-12-19 NOTE — ED Triage Notes (Signed)
Pt states that she has a perforated bowel per her scan that was completed today. Pt has left lower abdominal pain and tenderness.  ?

## 2021-12-19 NOTE — ED Provider Notes (Signed)
?[facility] DEPT ?Provider Note ? ? ?CSN: [id number] ?Arrival date & time: 12/19/21  1841 ? ?  ? ?History ? ?Chief Complaint  ?Patient presents with  ? Diverticulitis  ? ? ?Lauren Franklin is a 41 y.o. female. ? ?The history is provided by the patient.  ?Abdominal Pain ?Pain location:  LLQ ?Pain quality: aching   ?Pain radiates to:  Does not radiate ?Pain severity:  Mild ?Onset quality:  Gradual ?Duration:  2 days ?Timing:  Constant ?Progression:  Unchanged ?Chronicity:  New ?Context comment:  Outpt CT scan showed acute diverticulitis with contained perforation ?Relieved by:  Nothing ?Worsened by:  Nothing ?Associated symptoms: no anorexia, no belching, no chest pain, no chills, no constipation, no cough, no diarrhea, no dysuria, no fatigue, no fever, no flatus, no hematuria, no melena, no nausea, no shortness of breath, no sore throat and no vomiting   ? ?  ? ?Home Medications ?Prior to Admission medications   ?Medication Sig Start Date End Date Taking? Authorizing Provider  ?amphetamine-dextroamphetamine (ADDERALL) 20 MG tablet TAKE 1 TABLET BY MOUTH TWICE DAILY 08/17/21 02/13/22  Hali Marry, MD  ?levonorgestrel (MIRENA) 20 MCG/24HR IUD 1 each by Intrauterine route once.    [provider]  ?   ? ?Allergies    ?Penicillins   ? ?Review of Systems   ?Review of Systems  ?Constitutional:  Negative for chills, fatigue and fever.  ?HENT:  Negative for sore throat.   ?Respiratory:  Negative for cough and shortness of breath.   ?Cardiovascular:  Negative for chest pain.  ?Gastrointestinal:  Positive for abdominal pain. Negative for anorexia, constipation, diarrhea, flatus, melena, nausea and vomiting.  ?Genitourinary:  Negative for dysuria and hematuria.  ? ?Physical Exam ?Updated Vital Signs ?BP 106/71   Pulse 91   Temp 98.5 ?F (36.9 ?C) (Oral)   Resp 16   SpO2 100%  ?Physical Exam ?Vitals and nursing note reviewed.  ?Constitutional:   ?   General: She is not in  acute distress. ?   Appearance: She is well-developed. She is not ill-appearing.  ?HENT:  ?   Head: Normocephalic and atraumatic.  ?   Nose: Nose normal.  ?   Mouth/Throat:  ?   Mouth: Mucous membranes are moist.  ?Eyes:  ?   Extraocular Movements: Extraocular movements intact.  ?   Conjunctiva/sclera: Conjunctivae normal.  ?   Pupils: Pupils are equal, round, and reactive to light.  ?Cardiovascular:  ?   Rate and Rhythm: Normal rate and regular rhythm.  ?   Pulses: Normal pulses.  ?   Heart sounds: Normal heart sounds. No murmur heard. ?Pulmonary:  ?   Effort: Pulmonary effort is normal. No respiratory distress.  ?   Breath sounds: Normal breath sounds.  ?Abdominal:  ?   Palpations: Abdomen is soft.  ?   Tenderness: There is abdominal tenderness.  ?Musculoskeletal:     ?   General: No swelling.  ?   Cervical back: Normal range of motion and neck supple.  ?Skin: ?   General: Skin is warm and dry.  ?   Capillary Refill: Capillary refill takes less than 2 seconds.  ?Neurological:  ?   Mental Status: She is alert.  ?Psychiatric:     ?   Mood and Affect: Mood normal.  ? ? ?ED Results / Procedures / Treatments   ?Labs ?(all labs ordered are listed, but only abnormal results are displayed) ?Labs Reviewed  ?COMPREHENSIVE METABOLIC PANEL - Abnormal; Notable  for the following components:  ?    Result Value  ? Glucose, Bld 100 (*)   ? Calcium 8.8 (*)   ? AST 81 (*)   ? ALT 67 (*)   ? All other components within normal limits  ?CBC WITH DIFFERENTIAL/PLATELET - Abnormal; Notable for the following components:  ? WBC 18.1 (*)   ? RBC 3.76 (*)   ? HCT 35.0 (*)   ? Neutro Abs 14.3 (*)   ? Monocytes Absolute 1.5 (*)   ? Abs Immature Granulocytes 0.09 (*)   ? All other components within normal limits  ?RESP PANEL BY RT-PCR (FLU A&B, COVID) ARPGX2  ?LACTIC ACID, PLASMA  ?LACTIC ACID, PLASMA  ?URINALYSIS, ROUTINE W REFLEX MICROSCOPIC  ?I-STAT BETA HCG BLOOD, ED ([facility], [facility], [facility] ONLY)  ? ? ?EKG ?None ? ?Radiology ?CT Abdomen Pelvis W  Contrast ? ?Result Date: 12/19/2021 ?CLINICAL DATA:  LLQ abdominal pain EXAM: CT ABDOMEN AND PELVIS WITH CONTRAST TECHNIQUE: Multidetector CT imaging of the abdomen and pelvis was performed using the standard protocol following bolus administration of intravenous contrast. RADIATION DOSE REDUCTION: This exam was performed according to the departmental dose-optimization program which includes automated exposure control, adjustment of the mA and/or kV according to patient size and/or use of iterative reconstruction technique. CONTRAST:  131m OMNIPAQUE IOHEXOL 300 MG/ML  SOLN COMPARISON:  None. FINDINGS: Lower chest: No acute abnormality. Hepatobiliary: No focal liver abnormality is seen. Prior cholecystectomy. Pancreas: Unremarkable. No pancreatic ductal dilatation or surrounding inflammatory changes. Spleen: Normal in size without focal abnormality. Adrenals/Urinary Tract: Adrenal glands are unremarkable. No hydronephrosis or nephrolithiasis. The bladder is minimally distended. Stomach/Bowel: The stomach is within normal limits. There is no evidence of bowel obstruction.The appendix is normal. There is wall thickening and inflammatory stranding along the mid to distal descending colon. There is a small adjacent collection measuring 1.1 x 1.0 x 1.7 cm along the medial aspect near the descending-sigmoid junction consistent with a contained perforation (series 2, image 49). Vascular/Lymphatic: There is an pelvic vascular congestion, left greater than right. No AAA. No lymphadenopathy. Reproductive: There is an IUD noted within a retroverted uterus. There is a fat containing mass in the right adnexa with soft tissue component measuring up to 5.3 x 3.8 x 4.6 cm. Other: Trace free fluid in the pelvis, nonspecific. No focal/drainable fluid collection. Musculoskeletal: No acute or significant osseous findings. IMPRESSION: Acute diverticulitis/colitis of the mid to distal descending colon, with adjacent small collection along  the distal aspect measuring 1.1 x 1.0 x 1.7 cm consistent with a contained perforation. 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 Recommend gyn referral/consultation. IUD is in place within a retroverted uterus. These results will be called to the ordering clinician or representative by the Radiologist Assistant, and communication documented in the PACS or [employer] Electronically Signed   By: JMaurine SimmeringM.D.   On: 12/19/2021 16:38  ? ?[place]elvic Complete With Transvaginal ? ?Result Date: 12/19/2021 ?CLINICAL DATA:  Left lower quadrant pain. Mirena IUD placed 11/14/2021. EXAM: TRANSABDOMINAL AND TRANSVAGINAL ULTRASOUND OF PELVIS TECHNIQUE: Both transabdominal and transvaginal ultrasound examinations of the pelvis were performed. Transabdominal technique was performed for global imaging of the pelvis including uterus, ovaries, adnexal regions, and pelvic cul-de-sac. It was necessary to proceed with endovaginal exam following the transabdominal exam to visualize the uterus, endometrium, ovaries and adnexal regions. COMPARISON:  None FINDINGS: Uterus Measurements: 9.1 x 5.3 x 6.0 cm = volume: 153 mL. No fibroids or other mass visualized. Endometrium Thickness:  3 mm, within normal limits. Intrauterine contraceptive device is not seen within the endometrial canal. Right ovary Measurements: 3.5 x 2.2 x 1.5 cm = volume: 6.2 mL. Normal appearance/no adnexal mass. Left ovary Measurements: 3.7 x 1.1 x 1.6 cm = volume: 3.3 mL. Normal appearance/no adnexal mass. Other findings Trace pelvic free fluid.  Dilated veins are seen in left adnexa. IMPRESSION: 1. Intrauterine contraceptive device is not seen within the endometrial canal. 2. Trace free fluid. 3. Prominent veins/venous congestion in the left adnexa. Electronically Signed   By: Lorin Picket M.D.   On: 12/19/2021 11:39   ? ?Procedures ?Procedures  ? ? ?Medications Ordered in ED ?Medications  ?metroNIDAZOLE (FLAGYL) IVPB 500 mg (500 mg Intravenous New Bag/Given  12/19/21 2118)  ?fentaNYL (SUBLIMAZE) injection 50 mcg (50 mcg Intravenous Given 12/19/21 2027)  ?sodium chloride 0.9 % bolus 1,000 mL (1,000 mLs Intravenous New Bag/Given 12/19/21 2027)  ?ceFEPIme (MAXIPIME) 2 g in so

## 2021-12-19 NOTE — ED Provider Triage Note (Signed)
Emergency Medicine Provider Triage Evaluation Note ? ?Lauren Franklin , a 41 y.o. female  was evaluated in triage.  Pt complains of abd pain. Acute onset of LLQ pain yesterday, nausea.  Seen by PCP today, had CT scan showing perforated diverticulitis ? ?Review of Systems  ?Positive: LLQ pain, nausea ?Negative: Fever, chills, dysuria ? ?Physical Exam  ?BP 99/74 (BP Location: Right Arm)   Pulse 86   Temp 98.5 ?F (36.9 ?C) (Oral)   Resp 16   SpO2 100%  ?Gen:   Awake, no distress   ?Resp:  Normal effort  ?MSK:   Moves extremities without difficulty  ?Other:  TTP LLQ ? ?Medical Decision Making  ?Medically screening exam initiated at 7:38 PM.  Appropriate orders placed.  Lauren Franklin was informed that the remainder of the evaluation will be completed by another provider, this initial triage assessment does not replace that evaluation, and the importance of remaining in the ED until their evaluation is complete. ? ? ?  ?Lauren Moras, PA-C ?12/19/21 1957 ? ?

## 2021-12-19 NOTE — H&P (Signed)
?History and Physical  ? ? ?Lauren Franklin EZM:[id number] DOB: 22-Jan-1981 DOA: 12/19/2021 ? ?PCP: Hali Marry, MD  ?Patient coming from: Home ? ?I have personally briefly reviewed patient's old medical records in [place] ? ?Chief Complaint: Diverticulitis ? ?HPI: ?Lauren Franklin is a 41 y.o. female with medical history significant for ADHD who presented to the ED for evaluation of acute diverticulitis. ? ?Patient reports new onset of nonradiating left lower quadrant abdominal pain beginning evening of 4/30.  She has not had any associated nausea, vomiting, diarrhea, subjective fevers, chills, diaphoresis.  She had a Mirena IUD placed a few weeks ago. ? ?She was seen in her primary care clinic earlier today (5/1).  Pelvic ultrasound was obtained which showed prominent veins/venous congestion in the left adnexa, trace free fluid.  IUD was not seen within the endometrial canal. ? ?Follow-up CT abdomen/pelvis with contrast was obtained and showed acute diverticulitis/colitis of the mid to distal descending colon with small collection measuring 1.1 x 1.0 x 1.7 cm consistent with contained perforation.  A right ovarian dermoid measuring up to 5.3 cm was also seen.  IUD was seen in place within the retroverted uterus. ? ?Patient was called with the results and advised to come to the ED for further management.  She denies any similar episodes in the past.  She has not had any chest pain or dyspnea. ? ?ED Course  Labs/Imaging on admission: I have personally reviewed following labs and imaging studies. ? ?Initial vitals showed BP 99/74, pulse 86, RR 16, temp 98.5 ?F, SPO2 100% on room air. ? ?Labs show WBC 18.1, hemoglobin 12.3, platelets 273,000, sodium 136, potassium 3.6, bicarb 24, BUN 9, creatinine 0.67, serum glucose 100, AST 81, ALT 67, alk phos 100, total bilirubin 1.1, i-STAT beta-hCG <5.0, lactic acid 0.9. ? ?Respiratory panel in process. ? ?Patient was given 1 L normal saline, IV  cefepime and Flagyl.  EDP discussed with on-call general surgery who recommended conservative management and medical admission.  The hospitalist service was consulted to admit for further evaluation and management. ? ?Review of Systems: All systems reviewed and are negative except as documented in history of present illness above. ? ? ?Past Medical History:  ?Diagnosis Date  ? ADD (attention deficit disorder)   ? ADHD (attention deficit hyperactivity disorder)   ? IUD   ? mirena  ? Vaginal Pap smear, abnormal   ? Viral gastroenteritis 07/12/2015  ? ? ?Past Surgical History:  ?Procedure Laterality Date  ? CHOLECYSTECTOMY  2000  ? ? ?Social History: ? reports that she quit smoking about 2 years ago. Her smoking use included cigarettes. She has never used smokeless tobacco. She reports current alcohol use. She reports that she does not use drugs. ? ?Allergies  ?Allergen Reactions  ? Penicillins Itching  ? ? ?Family History  ?Problem Relation Age of Onset  ? Diabetes Other   ? ? ? ?Prior to Admission medications   ?Medication Sig Start Date End Date Taking? Authorizing Provider  ?amphetamine-dextroamphetamine (ADDERALL) 20 MG tablet TAKE 1 TABLET BY MOUTH TWICE DAILY ?Patient taking differently: Take 20 mg by mouth 2 (two) times daily as needed (for help with focusing). 08/17/21 02/13/22 Yes Hali Marry, MD  ?levonorgestrel (MIRENA) 20 MCG/24HR IUD 1 each by Intrauterine route once.   Yes [provider]  ? ? ?Physical Exam: ?Vitals:  ? 12/19/21 1922 12/19/21 2045 12/19/21 2232 12/19/21 2259  ?BP: 99/74 106/71 109/69   ?Pulse: 86 91 86   ?  Resp: '16 16 16   '$ ?Temp: 98.5 ?F (36.9 ?C)  99 ?F (37.2 ?C)   ?TempSrc: Oral  Oral   ?SpO2: 100% 100% 100%   ?Weight:    54.6 kg  ?Height:    '5\' 4"'$  (1.626 m)  ? ?Constitutional: NAD, calm, comfortable ?Eyes:  lids and conjunctivae normal ?ENMT: Mucous membranes are moist. Posterior pharynx clear of any exudate or lesions.Normal dentition.  ?Neck: normal, supple, no  masses. ?Respiratory: clear to auscultation bilaterally, no wheezing, no crackles. Normal respiratory effort. No accessory muscle use.  ?Cardiovascular: Regular rate and rhythm, no murmurs / rubs / gallops. No extremity edema. 2+ pedal pulses. ?Abdomen: Left lower quadrant tenderness. No hepatosplenomegaly. Bowel sounds present.  ?Musculoskeletal: no clubbing / cyanosis. No joint deformity upper and lower extremities. Good ROM, no contractures. Normal muscle tone.  ?Skin: no rashes, lesions, ulcers. No induration ?Neurologic: Sensation intact. Strength 5/5 in all 4.  ?Psychiatric: Normal judgment and insight. Alert and oriented x 3. Normal mood.  ? ?EKG: Not performed. ? ?Assessment/Plan ?Principal Problem: ?  Acute diverticulitis of large intestine with contained perforation ?Active Problems: ?  Dermoid cyst of ovary, right ?  ?Megon Lauren Franklin is a 41 y.o. female with medical history significant for ADHD who is admitted with acute diverticulitis. ? ?Assessment and Plan: ?* Acute diverticulitis of large intestine with contained perforation ?EDP discussed with general surgery who recommends conservative management at this time.  WBC up to 18.1 otherwise labs stable, sepsis not present on admission. ?-Continue IV ceftriaxone and Flagyl ?-Keep n.p.o. ?-Continue IV fluid hydration overnight ?-Continue analgesics and antiemetics as needed ? 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  Follow-up with gynecology recommended. ? ?DVT prophylaxis: SCDs Start: 12/19/21 2218 ?Code Status: Full code, confirmed on admission. ?Family Communication: Discussed with patient's husband at bedside. ?Disposition Plan: From home and likely discharge to home pending clinical progress. ?Consults called: General surgery ?Severity of Illness: ?The appropriate patient status for this patient is OBSERVATION. Observation status is judged to be reasonable and necessary in order to  provide the required intensity of service to ensure the patient's safety. The patient's presenting symptoms, physical exam findings, and initial radiographic and laboratory data in the context of their medical condition is felt to place them at decreased risk for further clinical deterioration. Furthermore, it is anticipated that the patient will be medically stable for discharge from the hospital within 2 midnights of admission.   ?Zada Finders MD ?[facility] ? ?If 7PM-7AM, please contact night-coverage ?[email or web address] ? ?12/19/2021, 11:01 PM  ?

## 2021-12-19 NOTE — Addendum Note (Signed)
Addended by: Beatrice Lecher D on: 12/19/2021 01:18 PM ? ? Modules accepted: Orders ? ?

## 2021-12-19 NOTE — Assessment & Plan Note (Signed)
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  Follow-up with gynecology recommended. ?

## 2021-12-19 NOTE — Progress Notes (Signed)
A consult was received from an ED physician for Zosyn per pharmacy dosing.  The patient's profile has been reviewed for ht/wt/allergies/indication/available labs.  Patient with documented penicillin allergy listed as rash.  Noted that patient has previously tolerated ceftriaxone and cephalexin in the past. Discussed with EDP, will change Zosyn to cefepime/flagyl. ? ?A one time order has been placed for cefepime 2g IV x1 and metronidazole '500mg'$  IV x1.  Further antibiotics/pharmacy consults should be ordered by admitting physician if indicated.       ?                ?Thank you, ? ?Dimple Nanas, PharmD ?12/19/2021 8:11 PM ? ?

## 2021-12-19 NOTE — Assessment & Plan Note (Addendum)
EDP discussed with general surgery who recommends conservative management at this time.  WBC up to 18.1>>14 otherwise labs stable, sepsis not present on admission.  Tolerating clear liquid ?-Continue IV ceftriaxone and Flagyl ?-Continue with clear liquid diet, will advance as advised by his general surgery. ?-Continue analgesics and antiemetics as needed. ?

## 2021-12-19 NOTE — Progress Notes (Signed)
? ?  Acute Office Visit ? ?Subjective:  ? ?  ?Patient ID: Lauren Franklin, female    DOB: [birth year], 41 y.o.   MRN: [id number] ? ?Chief Complaint  ?Patient presents with  ? Abdominal Pain  ? ? ?HPI ?Patient is in today for  ? ?Pt reports that she began experiencing LLQ pain last night. 5/10 burns,constant,dull,tender to touch.  It was keeping her awake last night.  She noticed that it was more painful laying down and sitting up.  To sit down gingerly or it is worse.  She denies any blood in the stool.  No nausea or vomiting.  No diarrhea.  She has had a history of an ovarian cyst before.  She did have her Mirena changed about 2 to 3 weeks ago.  No dysuria.  She has not taken any medications for it. Last BM was a couple of  days ago she reports that it was normal.  ? ?ROS ? ? ?   ?Objective:  ?  ?BP 109/66   Pulse (!) 117   Wt 118 lb (53.5 kg)   SpO2 100%   BMI 20.25 kg/m?  ? ? ?Physical Exam ?Vitals reviewed.  ?Constitutional:   ?   Appearance: She is well-developed.  ?HENT:  ?   Head: Normocephalic and atraumatic.  ?Eyes:  ?   Conjunctiva/sclera: Conjunctivae normal.  ?Cardiovascular:  ?   Rate and Rhythm: Normal rate.  ?Pulmonary:  ?   Effort: Pulmonary effort is normal.  ?Abdominal:  ?   Tenderness: There is abdominal tenderness in the suprapubic area and left lower quadrant. There is guarding. There is no rebound.  ?Skin: ?   General: Skin is dry.  ?   Coloration: Skin is not pale.  ?Neurological:  ?   Mental Status: She is alert and oriented to person, place, and time.  ?Psychiatric:     ?   Behavior: Behavior normal.  ? ? ?No results found for any visits on 12/19/21. ? ? ?   ?Assessment & Plan:  ? ?Problem List Items Addressed This Visit   ?None ?Visit Diagnoses   ? ? LLQ pain    -  Primary  ? Relevant Orders  ? US Pelvic Complete With Transvaginal  ? ?  ? ? ?Left lower quadrant pain-we discussed that it could be an ovarian cyst or even possible torsion.  She did have a new Mirena placed a couple of  weeks ago so we will also make sure that the IUD is in proper position in place there is no sign of perforation etc.  Unlikely to be diverticulitis that she has not had any GI or bowel symptoms just discomfort.  But we will start with pelvic ultrasound for further evaluation.  If negative then consider stat CT for further work-up since she does have some guarding on exam. Given IBU to take.  ? ?No orders of the defined types were placed in this encounter. ? ? ?No follow-ups on file. ? ?Beatrice Lecher, MD ? ? ?

## 2021-12-19 NOTE — Progress Notes (Signed)
Pt reports that she began experiencing LLQ pain last night. 5/10 burns,constant,dull,tender to touch.  ? ?Last BM was a couple of  days ago she reports that it was normal.  ?

## 2021-12-20 DIAGNOSIS — Z20822 Contact with and (suspected) exposure to covid-19: Secondary | ICD-10-CM | POA: Diagnosis present

## 2021-12-20 DIAGNOSIS — Z8619 Personal history of other infectious and parasitic diseases: Secondary | ICD-10-CM | POA: Diagnosis not present

## 2021-12-20 DIAGNOSIS — F909 Attention-deficit hyperactivity disorder, unspecified type: Secondary | ICD-10-CM | POA: Diagnosis present

## 2021-12-20 DIAGNOSIS — K529 Noninfective gastroenteritis and colitis, unspecified: Secondary | ICD-10-CM | POA: Diagnosis present

## 2021-12-20 DIAGNOSIS — N854 Malposition of uterus: Secondary | ICD-10-CM | POA: Diagnosis present

## 2021-12-20 DIAGNOSIS — R7989 Other specified abnormal findings of blood chemistry: Secondary | ICD-10-CM | POA: Diagnosis present

## 2021-12-20 DIAGNOSIS — Z87891 Personal history of nicotine dependence: Secondary | ICD-10-CM | POA: Diagnosis not present

## 2021-12-20 DIAGNOSIS — K5732 Diverticulitis of large intestine without perforation or abscess without bleeding: Secondary | ICD-10-CM | POA: Diagnosis not present

## 2021-12-20 DIAGNOSIS — R7401 Elevation of levels of liver transaminase levels: Secondary | ICD-10-CM | POA: Diagnosis present

## 2021-12-20 DIAGNOSIS — Z793 Long term (current) use of hormonal contraceptives: Secondary | ICD-10-CM | POA: Diagnosis not present

## 2021-12-20 DIAGNOSIS — Z975 Presence of (intrauterine) contraceptive device: Secondary | ICD-10-CM | POA: Diagnosis not present

## 2021-12-20 DIAGNOSIS — F988 Other specified behavioral and emotional disorders with onset usually occurring in childhood and adolescence: Secondary | ICD-10-CM | POA: Diagnosis not present

## 2021-12-20 DIAGNOSIS — D270 Benign neoplasm of right ovary: Secondary | ICD-10-CM | POA: Diagnosis present

## 2021-12-20 DIAGNOSIS — R1032 Left lower quadrant pain: Secondary | ICD-10-CM | POA: Diagnosis present

## 2021-12-20 DIAGNOSIS — K5720 Diverticulitis of large intestine with perforation and abscess without bleeding: Secondary | ICD-10-CM | POA: Diagnosis present

## 2021-12-20 DIAGNOSIS — Z9049 Acquired absence of other specified parts of digestive tract: Secondary | ICD-10-CM | POA: Diagnosis not present

## 2021-12-20 DIAGNOSIS — Z880 Allergy status to penicillin: Secondary | ICD-10-CM | POA: Diagnosis not present

## 2021-12-20 LAB — BASIC METABOLIC PANEL
Anion gap: 6 (ref 5–15)
BUN: 7 mg/dL (ref 6–20)
CO2: 22 mmol/L (ref 22–32)
Calcium: 8.1 mg/dL — ABNORMAL LOW (ref 8.9–10.3)
Chloride: 108 mmol/L (ref 98–111)
Creatinine, Ser: 0.58 mg/dL (ref 0.44–1.00)
GFR, Estimated: >60 mL/min (ref >60)
Glucose, Bld: 86 mg/dL (ref 70–99)
Potassium: 3.7 mmol/L (ref 3.5–5.1)
Sodium: 136 mmol/L (ref 135–145)

## 2021-12-20 LAB — CBC
HCT: 31.2 % — ABNORMAL LOW (ref 36.0–46.0)
Hemoglobin: 10.9 g/dL — ABNORMAL LOW (ref 12.0–15.0)
MCH: 32.8 pg (ref 26.0–34.0)
MCHC: 34.9 g/dL (ref 30.0–36.0)
MCV: 94 fL (ref 80.0–100.0)
Platelets: 247 10*3/uL (ref 150–400)
RBC: 3.32 MIL/uL — ABNORMAL LOW (ref 3.87–5.11)
RDW: 13.2 % (ref 11.5–15.5)
WBC: 14 10*3/uL — ABNORMAL HIGH (ref 4.0–10.5)
nRBC: 0 % (ref 0.0–0.2)

## 2021-12-20 LAB — HIV ANTIBODY (ROUTINE TESTING W REFLEX): HIV Screen 4th Generation wRfx: NONREACTIVE

## 2021-12-20 NOTE — Consult Note (Signed)
? ?CC: LLQ pain ? ?HPI: ?Lauren Franklin is an 41 y.o. female who is here for LLQ pain.  This began on Sun.  No other symptoms noted.  She saw her Gyn due to a recently placed IUD.  Pelvic US and CT performed.  CT showed a R ovarian dermoid cyst and a contained perforation of her descending colon (1x1.7cm).  Pt was told to come to the ED.  Having reg bowel function.  ? ?Past Medical History:  ?Diagnosis Date  ? ADD (attention deficit disorder)   ? ADHD (attention deficit hyperactivity disorder)   ? IUD   ? mirena  ? Vaginal Pap smear, abnormal   ? Viral gastroenteritis 07/12/2015  ? ? ?Past Surgical History:  ?Procedure Laterality Date  ? CHOLECYSTECTOMY  2000  ? ? ?Family History  ?Problem Relation Age of Onset  ? Diabetes Other   ? ? ?Social:  reports that she quit smoking about 2 years ago. Her smoking use included cigarettes. She has never used smokeless tobacco. She reports current alcohol use. She reports that she does not use drugs. ? ?Allergies:  ?Allergies  ?Allergen Reactions  ? Penicillins Itching  ? ? ?Medications: I have reviewed the patient's current medications. ? ?Results for orders placed or performed during the hospital encounter of 12/19/21 (from the past 48 hour(s))  ?Lactic acid, plasma     Status: None  ? Collection Time: 12/19/21  7:39 PM  ?Result Value Ref Range  ? Lactic Acid, Venous 0.9 0.5 - 1.9 mmol/L  ?  Comment: Performed at [facility], [place] [address] [place], [place] [address]  ?Comprehensive metabolic panel     Status: Abnormal  ? Collection Time: 12/19/21  7:39 PM  ?Result Value Ref Range  ? Sodium 136 135 - 145 mmol/L  ? Potassium 3.6 3.5 - 5.1 mmol/L  ? Chloride 105 98 - 111 mmol/L  ? CO2 24 22 - 32 mmol/L  ? Glucose, Bld 100 (H) 70 - 99 mg/dL  ?  Comment: Glucose reference range applies only to samples taken after fasting for at least 8 hours.  ? BUN 9 6 - 20 mg/dL  ? Creatinine, Ser 0.67 0.44 - 1.00 mg/dL  ? Calcium 8.8 (L) 8.9 - 10.3 mg/dL  ? Total  Protein 7.7 6.5 - 8.1 g/dL  ? Albumin 4.2 3.5 - 5.0 g/dL  ? AST 81 (H) 15 - 41 U/L  ? ALT 67 (H) 0 - 44 U/L  ? Alkaline Phosphatase 100 38 - 126 U/L  ? Total Bilirubin 1.1 0.3 - 1.2 mg/dL  ? GFR, Estimated >60 >60 mL/min  ?  Comment: (NOTE) ?Calculated using the CKD-EPI Creatinine Equation (2021) ?  ? Anion gap 7 5 - 15  ?  Comment: Performed at [facility], [place] [address], [place], [place] [address]  ?CBC with Differential     Status: Abnormal  ? Collection Time: 12/19/21  7:39 PM  ?Result Value Ref Range  ? WBC 18.1 (H) 4.0 - 10.5 K/uL  ? RBC 3.76 (L) 3.87 - 5.11 MIL/uL  ? Hemoglobin 12.3 12.0 - 15.0 g/dL  ? HCT 35.0 (L) 36.0 - 46.0 %  ? MCV 93.1 80.0 - 100.0 fL  ? MCH 32.7 26.0 - 34.0 pg  ? MCHC 35.1 30.0 - 36.0 g/dL  ? RDW 13.1 11.5 - 15.5 %  ? Platelets 273 150 - 400 K/uL  ? nRBC 0.0 0.0 - 0.2 %  ? Neutrophils Relative % 79 %  ?  Neutro Abs 14.3 (H) 1.7 - 7.7 K/uL  ? Lymphocytes Relative 11 %  ? Lymphs Abs 2.0 0.7 - 4.0 K/uL  ? Monocytes Relative 8 %  ? Monocytes Absolute 1.5 (H) 0.1 - 1.0 K/uL  ? Eosinophils Relative 1 %  ? Eosinophils Absolute 0.2 0.0 - 0.5 K/uL  ? Basophils Relative 0 %  ? Basophils Absolute 0.0 0.0 - 0.1 K/uL  ? Immature Granulocytes 1 %  ? Abs Immature Granulocytes 0.09 (H) 0.00 - 0.07 K/uL  ?  Comment: Performed at [facility], [place] [address], [place], [place] [address]  ?Resp Panel by RT-PCR (Flu A&B, Covid) Nasopharyngeal Swab     Status: None  ? Collection Time: 12/19/21  8:28 PM  ? Specimen: Nasopharyngeal Swab; Nasopharyngeal(NP) swabs in vial transport medium  ?Result Value Ref Range  ? SARS Coronavirus 2 by RT PCR NEGATIVE NEGATIVE  ?  Comment: (NOTE) ?SARS-CoV-2 target nucleic acids are NOT DETECTED. ? ?The SARS-CoV-2 RNA is generally detectable in upper respiratory ?specimens during the acute phase of infection. The lowest ?concentration of SARS-CoV-2 viral copies this assay can detect is ?138 copies/mL. A negative result does not preclude  SARS-Cov-2 ?infection and should not be used as the sole basis for treatment or ?other patient management decisions. A negative result may occur with  ?improper specimen collection/handling, submission of specimen other ?than nasopharyngeal swab, presence of viral mutation(s) within the ?areas targeted by this assay, and inadequate number of viral ?copies(<138 copies/mL). A negative result must be combined with ?clinical observations, patient history, and epidemiological ?information. The expected result is Negative. ? ?Fact Sheet for Patients:  ?EntrepreneurPulse.com.au ? ?Fact Sheet for Healthcare Providers:  ?IncredibleEmployment.be ? ?This test is no t yet approved or cleared by the [place] FDA and  ?has been authorized for detection and/or diagnosis of SARS-CoV-2 by ?FDA under an Emergency Use Authorization (EUA). This EUA will remain  ?in effect (meaning this test can be used) for the duration of the ?COVID-19 declaration under Section 564(b)(1) of the Act, 21 ?U.S.C.section 360bbb-3(b)(1), unless the authorization is terminated  ?or revoked sooner.  ? ? ?  ? Influenza A by PCR NEGATIVE NEGATIVE  ? Influenza B by PCR NEGATIVE NEGATIVE  ?  Comment: (NOTE) ?The Xpert Xpress SARS-CoV-2/FLU/RSV plus assay is intended as an aid ?in the diagnosis of influenza from Nasopharyngeal swab specimens and ?should not be used as a sole basis for treatment. Nasal washings and ?aspirates are unacceptable for Xpert Xpress SARS-CoV-2/FLU/RSV ?testing. ? ?Fact Sheet for Patients: ?EntrepreneurPulse.com.au ? ?Fact Sheet for Healthcare Providers: ?IncredibleEmployment.be ? ?This test is not yet approved or cleared by the [place] FDA and ?has been authorized for detection and/or diagnosis of SARS-CoV-2 by ?FDA under an Emergency Use Authorization (EUA). This EUA will remain ?in effect (meaning this test can be used) for the duration of the ?COVID-19  declaration under Section 564(b)(1) of the Act, 21 U.S.C. ?section 360bbb-3(b)(1), unless the authorization is terminated or ?revoked. ? ?Performed at [facility], [address] ?[place], [place] [address] ?  ?I-Stat beta hCG blood, ED     Status: None  ? Collection Time: 12/19/21  8:35 PM  ?Result Value Ref Range  ? I-stat hCG, quantitative <5.0 <5 mIU/mL  ? Comment 3          ?  Comment:   GEST. AGE      CONC.  (mIU/mL) ?  <=1 WEEK        5 -  50 ?    2 WEEKS       50 - 500 ?    3 WEEKS       100 - 10,000 ?    4 WEEKS     1,000 - 30,000 ?       ?FEMALE AND NON-PREGNANT FEMALE: ?    LESS THAN 5 mIU/mL ?  ?Urinalysis, Routine w reflex microscopic Urine, Clean Catch     Status: Abnormal  ? Collection Time: 12/19/21  9:45 PM  ?Result Value Ref Range  ? Color, Urine STRAW (A) YELLOW  ? APPearance CLEAR CLEAR  ? Specific Gravity, Urine 1.024 1.005 - 1.030  ? pH 6.0 5.0 - 8.0  ? Glucose, UA NEGATIVE NEGATIVE mg/dL  ? Hgb urine dipstick MODERATE (A) NEGATIVE  ? Bilirubin Urine NEGATIVE NEGATIVE  ? Ketones, ur 20 (A) NEGATIVE mg/dL  ? Protein, ur NEGATIVE NEGATIVE mg/dL  ? Nitrite NEGATIVE NEGATIVE  ? Leukocytes,Ua NEGATIVE NEGATIVE  ? RBC / HPF 0-5 0 - 5 RBC/hpf  ? WBC, UA 0-5 0 - 5 WBC/hpf  ? Bacteria, UA RARE (A) NONE SEEN  ? Squamous Epithelial / LPF 0-5 0 - 5  ?  Comment: Performed at [facility], [place] [address], [place], [place] [address]  ?Lactic acid, plasma     Status: None  ? Collection Time: 12/19/21 10:36 PM  ?Result Value Ref Range  ? Lactic Acid, Venous 0.7 0.5 - 1.9 mmol/L  ?  Comment: Performed at [facility], [place] [address], [place], [place] [address]  ?CBC     Status: Abnormal  ? Collection Time: 12/20/21  3:25 AM  ?Result Value Ref Range  ? WBC 14.0 (H) 4.0 - 10.5 K/uL  ? RBC 3.32 (L) 3.87 - 5.11 MIL/uL  ? Hemoglobin 10.9 (L) 12.0 - 15.0 g/dL  ? HCT 31.2 (L) 36.0 - 46.0 %  ? MCV 94.0 80.0 - 100.0 fL  ? MCH 32.8 26.0 - 34.0 pg  ? MCHC 34.9 30.0 -  36.0 g/dL  ? RDW 13.2 11.5 - 15.5 %  ? Platelets 247 150 - 400 K/uL  ? nRBC 0.0 0.0 - 0.2 %  ?  Comment: Performed at [facility], [place] [address] [place], [place] [address]  ?Basic me

## 2021-12-20 NOTE — Assessment & Plan Note (Signed)
Patient takes Adderall as needed at home. ?

## 2021-12-20 NOTE — Progress Notes (Signed)
?Progress Note ? ? ?Patient: Lauren Franklin VEL:[id number] DOB: 1981/02/25 DOA: 12/19/2021     0 ?DOS: the patient was seen and examined on 12/20/2021 ?  ?Brief hospital course: ?Lauren Franklin is a 41 y.o. female with medical history significant for ADHD who is admitted with acute diverticulitis. ? ?Patient reports new onset of nonradiating left lower quadrant abdominal pain beginning evening of 4/30.  She has not had any associated nausea, vomiting, diarrhea, subjective fevers, chills, diaphoresis.  She had a Mirena IUD placed a few weeks ago. ? ?She was seen in her primary care clinic earlier today (5/1).  Pelvic ultrasound was obtained which showed prominent veins/venous congestion in the left adnexa, trace free fluid.  IUD was not seen within the endometrial canal. ? ?Follow-up CT abdomen/pelvis with contrast was obtained and showed acute diverticulitis/colitis of the mid to distal descending colon with small collection measuring 1.1 x 1.0 x 1.7 cm consistent with contained perforation.  A right ovarian dermoid measuring up to 5.3 cm was also seen.  IUD was seen in place within the retroverted uterus. ? ?Patient was called with the results and advised to come to the ED for further management.  She denies any similar episodes in the past.  She has not had any chest pain or dyspnea. ? ?Once presented to ED, she was hemodynamically stable.  Labs pertinent for leukocytosis at 18.1, mild transaminitis.  COVID-19 PCR negative. ? ?EDP discussed with on-call general surgery who recommended conservative management and medical admission.  The hospitalist service was consulted to admit for further evaluation and management. ? ?5/2: Patient currently tolerating clear liquid diet without any concern.  Continued to have crampy abdominal pain with significant tenderness along left lower quadrant.  Leukocytosis started improving. ? ? ?Assessment and Plan: ?* Acute diverticulitis of large intestine with contained  perforation ?EDP discussed with general surgery who recommends conservative management at this time.  WBC up to 18.1>>14 otherwise labs stable, sepsis not present on admission.  Tolerating clear liquid ?-Continue IV ceftriaxone and Flagyl ?-Continue with clear liquid diet, will advance as advised by his general surgery. ?-Continue analgesics and antiemetics as needed. ? 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  Follow-up with gynecology recommended. ? ?ADD (attention deficit disorder) ?Patient takes Adderall as needed at home. ? ? ?Subjective: Patient was seen and examined today.  Continues to have crampy abdominal pain, no nausea or vomiting.  Tolerating clear liquid diet well.  She was passing flatus.  Last bowel movement was on Friday. ? ?Physical Exam: ?Vitals:  ? 12/20/21 0146 12/20/21 0554 12/20/21 0756 12/20/21 1343  ?BP: 92/60 (!) 90/58 96/67 100/64  ?Pulse: 77 70 70 71  ?Resp: '16 15 17 18  '$ ?Temp: 98.4 ?F (36.9 ?C) 98.3 ?F (36.8 ?C) 97.8 ?F (36.6 ?C) 97.9 ?F (36.6 ?C)  ?TempSrc: Oral Oral Axillary   ?SpO2: 99% 99% 100% 99%  ?Weight:      ?Height:      ? ?General.  Well-developed lady, in no acute distress. ?Pulmonary.  Lungs clear bilaterally, normal respiratory effort. ?CV.  Regular rate and rhythm, no JVD, rub or murmur. ?Abdomen.  Soft, LLQ tenderness, nondistended, BS positive. ?CNS.  Alert and oriented x3.  No focal neurologic deficit. ?Extremities.  No edema, no cyanosis, pulses intact and symmetrical. ?Psychiatry.  Judgment and insight appears normal. ? ?Data Reviewed: ?Prior notes, labs and images reviewed ? ?Family Communication: Discussed with patient ? ?Disposition: ?  Status is: Inpatient ?Remains inpatient appropriate because: Severity of illness ? ? Planned Discharge Destination: Home ? ?Time spent: 45 minutes ? ?This record has been created using Systems analyst. Errors have been sought and corrected,but may not  always be located. Such creation errors do not reflect on the standard of care. ? ?Author: ?Lorella Nimrod, MD ?12/20/2021 3:35 PM ? ?For on call review [email or web address].  ?

## 2021-12-20 NOTE — Plan of Care (Signed)

## 2021-12-20 NOTE — Progress Notes (Signed)
Result briefly discussed.  Directed to ED for possible admission.  There is also a little extra soft tissue component in the right adnexa measuring about 5 cm that we can address after she is released from the hospital.  We will need to get her back in with her GYN for this.

## 2021-12-21 DIAGNOSIS — F988 Other specified behavioral and emotional disorders with onset usually occurring in childhood and adolescence: Secondary | ICD-10-CM

## 2021-12-21 DIAGNOSIS — D270 Benign neoplasm of right ovary: Secondary | ICD-10-CM

## 2021-12-21 LAB — CBC
HCT: 33.8 % — ABNORMAL LOW (ref 36.0–46.0)
HCT: 35.9 % — ABNORMAL LOW (ref 36.0–46.0)
Hemoglobin: 11.2 g/dL — ABNORMAL LOW (ref 12.0–15.0)
Hemoglobin: 12 g/dL (ref 12.0–15.0)
MCH: 31.6 pg (ref 26.0–34.0)
MCH: 31.8 pg (ref 26.0–34.0)
MCHC: 33.1 g/dL (ref 30.0–36.0)
MCHC: 33.4 g/dL (ref 30.0–36.0)
MCV: 95.5 fL (ref 80.0–100.0)
MCV: 95.2 fL (ref 80.0–100.0)
Platelets: 264 10*3/uL (ref 150–400)
Platelets: 283 10*3/uL (ref 150–400)
RBC: 3.54 MIL/uL — ABNORMAL LOW (ref 3.87–5.11)
RBC: 3.77 MIL/uL — ABNORMAL LOW (ref 3.87–5.11)
RDW: 13.1 % (ref 11.5–15.5)
RDW: 13.1 % (ref 11.5–15.5)
WBC: 9.4 10*3/uL (ref 4.0–10.5)
WBC: 8.4 10*3/uL (ref 4.0–10.5)
nRBC: 0 % (ref 0.0–0.2)
nRBC: 0 % (ref 0.0–0.2)

## 2021-12-21 LAB — BASIC METABOLIC PANEL
Anion gap: 7 (ref 5–15)
BUN: 7 mg/dL (ref 6–20)
CO2: 24 mmol/L (ref 22–32)
Calcium: 8.6 mg/dL — ABNORMAL LOW (ref 8.9–10.3)
Chloride: 108 mmol/L (ref 98–111)
Creatinine, Ser: 0.69 mg/dL (ref 0.44–1.00)
GFR, Estimated: >60 mL/min (ref >60)
Glucose, Bld: 87 mg/dL (ref 70–99)
Potassium: 3.9 mmol/L (ref 3.5–5.1)
Sodium: 139 mmol/L (ref 135–145)

## 2021-12-21 NOTE — Progress Notes (Signed)
? ?Subjective/Chief Complaint: ?Pt with better abd pain ?Some liquids tol ? ? ?Objective: ?Vital signs in last 24 hours: ?Temp:  [97.9 ?F (36.6 ?C)-98.9 ?F (37.2 ?C)] 98.4 ?F (36.9 ?C) (05/03 0506) ?Pulse Rate:  [69-71] 69 (05/03 0506) ?Resp:  [16-18] 16 (05/03 0506) ?BP: (94-101)/(57-65) 101/65 (05/03 0506) ?SpO2:  [99 %-100 %] 99 % (05/03 0506) ?Last BM Date : 12/16/21 ? ?Intake/Output from previous day: ?05/02 0701 - 05/03 0700 ?In: 1540.9 [P.O.:860; I.V.:380.9; IV [id number]] ?Out: -  ?Intake/Output this shift: ?No intake/output data recorded. ? ?PE: ? ?Constitutional: No acute distress, conversant, appears states age. ?Eyes: Anicteric sclerae, moist conjunctiva, no lid lag ?Lungs: Clear to auscultation bilaterally, normal respiratory effort ?CV: regular rate and rhythm, no murmurs, no peripheral edema, pedal pulses 2+ ?GI: Soft, no masses or hepatosplenomegaly, tender to palpation to LLQ and suprapubically ?Skin: No rashes, palpation reveals normal turgor ?Psychiatric: appropriate judgment and insight, oriented to person, place, and time ? ? ?Lab Results:  ?Recent Labs  ?  12/20/21 ?0325 12/21/21 ?0308  ?WBC 14.0* 9.4  ?HGB 10.9* 11.2*  ?HCT 31.2* 33.8*  ?PLT 247 264  ? ?BMET ?Recent Labs  ?  12/20/21 ?0325 12/21/21 ?0308  ?NA 136 139  ?K 3.7 3.9  ?CL 108 108  ?CO2 22 24  ?GLUCOSE 86 87  ?BUN 7 7  ?CREATININE 0.58 0.69  ?CALCIUM 8.1* 8.6*  ? ?PT/INR ?No results for input(s): LABPROT, INR in the last 72 hours. ?ABG ?No results for input(s): PHART, HCO3 in the last 72 hours. ? ?Invalid input(s): PCO2, PO2 ? ?Studies/Results: ?CT Abdomen Pelvis W Contrast ? ?Result Date: 12/19/2021 ?CLINICAL DATA:  LLQ abdominal pain EXAM: CT ABDOMEN AND PELVIS WITH CONTRAST TECHNIQUE: Multidetector CT imaging of the abdomen and pelvis was performed using the standard protocol following bolus administration of intravenous contrast. RADIATION DOSE REDUCTION: This exam was performed according to the departmental  dose-optimization program which includes automated exposure control, adjustment of the mA and/or kV according to patient size and/or use of iterative reconstruction technique. CONTRAST:  123m OMNIPAQUE IOHEXOL 300 MG/ML  SOLN COMPARISON:  None. FINDINGS: Lower chest: No acute abnormality. Hepatobiliary: No focal liver abnormality is seen. Prior cholecystectomy. Pancreas: Unremarkable. No pancreatic ductal dilatation or surrounding inflammatory changes. Spleen: Normal in size without focal abnormality. Adrenals/Urinary Tract: Adrenal glands are unremarkable. No hydronephrosis or nephrolithiasis. The bladder is minimally distended. Stomach/Bowel: The stomach is within normal limits. There is no evidence of bowel obstruction.The appendix is normal. There is wall thickening and inflammatory stranding along the mid to distal descending colon. There is a small adjacent collection measuring 1.1 x 1.0 x 1.7 cm along the medial aspect near the descending-sigmoid junction consistent with a contained perforation (series 2, image 49). Vascular/Lymphatic: There is an pelvic vascular congestion, left greater than right. No AAA. No lymphadenopathy. Reproductive: There is an IUD noted within a retroverted uterus. There is a fat containing mass in the right adnexa with soft tissue component measuring up to 5.3 x 3.8 x 4.6 cm. Other: Trace free fluid in the pelvis, nonspecific. No focal/drainable fluid collection. Musculoskeletal: No acute or significant osseous findings. IMPRESSION: Acute diverticulitis/colitis of the mid to distal descending colon, with adjacent small collection along the distal aspect measuring 1.1 x 1.0 x 1.7 cm consistent with a contained perforation. 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 Recommend gyn referral/consultation. IUD is in place within a retroverted uterus. These results will be called to the ordering clinician or representative by the Radiologist Assistant,  and communication documented in  the PACS or [employer]. Electronically Signed   By: Maurine Simmering M.D.   On: 12/19/2021 16:38  ? ?US Pelvic Complete With Transvaginal ? ?Result Date: 12/19/2021 ?CLINICAL DATA:  Left lower quadrant pain. Mirena IUD placed 11/14/2021. EXAM: TRANSABDOMINAL AND TRANSVAGINAL ULTRASOUND OF PELVIS TECHNIQUE: Both transabdominal and transvaginal ultrasound examinations of the pelvis were performed. Transabdominal technique was performed for global imaging of the pelvis including uterus, ovaries, adnexal regions, and pelvic cul-de-sac. It was necessary to proceed with endovaginal exam following the transabdominal exam to visualize the uterus, endometrium, ovaries and adnexal regions. COMPARISON:  None FINDINGS: Uterus Measurements: 9.1 x 5.3 x 6.0 cm = volume: 153 mL. No fibroids or other mass visualized. Endometrium Thickness: 3 mm, within normal limits. Intrauterine contraceptive device is not seen within the endometrial canal. Right ovary Measurements: 3.5 x 2.2 x 1.5 cm = volume: 6.2 mL. Normal appearance/no adnexal mass. Left ovary Measurements: 3.7 x 1.1 x 1.6 cm = volume: 3.3 mL. Normal appearance/no adnexal mass. Other findings Trace pelvic free fluid.  Dilated veins are seen in left adnexa. IMPRESSION: 1. Intrauterine contraceptive device is not seen within the endometrial canal. 2. Trace free fluid. 3. Prominent veins/venous congestion in the left adnexa. Electronically Signed   By: Lorin Picket M.D.   On: 12/19/2021 11:39   ? ?Anti-infectives: ?Anti-infectives (From admission, onward)  ? ? Start     Dose/Rate Route Frequency Ordered Stop  ? 12/20/21 0800  metroNIDAZOLE (FLAGYL) IVPB 500 mg       ? 500 mg ?100 mL/hr over 60 Minutes Intravenous Every 12 hours 12/19/21 2220    ? 12/20/21 0400  cefTRIAXone (ROCEPHIN) 2 g in sodium chloride 0.9 % 100 mL IVPB       ? 2 g ?200 mL/hr over 30 Minutes Intravenous Every 24 hours 12/19/21 2220    ? 12/19/21 2015  cefTRIAXone (ROCEPHIN) 2 g in sodium chloride 0.9 % 100  mL IVPB  Status:  Discontinued       ? 2 g ?200 mL/hr over 30 Minutes Intravenous  Once 12/19/21 2009 12/19/21 2011  ? 12/19/21 2015  metroNIDAZOLE (FLAGYL) IVPB 500 mg       ? 500 mg ?100 mL/hr over 60 Minutes Intravenous  Once 12/19/21 2009 12/19/21 2218  ? 12/19/21 2015  ceFEPIme (MAXIPIME) 2 g in sodium chloride 0.9 % 100 mL IVPB       ? 2 g ?200 mL/hr over 30 Minutes Intravenous  Once 12/19/21 2013 12/19/21 2118  ? ?  ? ? ?Assessment/Plan: ?21F with diverticulitis and contained perforation ?-cont' abx ?-OK for full liquids ?-mobilize ?-hopefully con't to improve ? ? LOS: 1 day  ? ? ?Ralene Ok ?12/21/2021 ? ?

## 2021-12-21 NOTE — Progress Notes (Signed)
Transition of Care (TOC) Screening Note ? ?Patient Details  ?Name: Lauren Franklin ?Date of Birth: [birth year] ? ?Transition of Care (TOC) CM/SW Contact:    ?Sherie Don, LCSW ?Phone Number: ?12/21/2021, 9:44 AM ? ?Transition of Care Department [facility]) has reviewed patient and no TOC needs have been identified at this time. We will continue to monitor patient advancement through interdisciplinary progression rounds. If new patient transition needs arise, please place a TOC consult. ?

## 2021-12-21 NOTE — Progress Notes (Signed)
?  Progress Note ? ? ?Patient: Lauren Franklin EPP:[id number] DOB: 1981-07-25 DOA: 12/19/2021     1 ? ?  ?Brief hospital course: ?Lauren Franklin is a 41 y.o. female with medical history significant for ADHD who presented to hospital with left lower quadrant pain.   She had a Mirena IUD placed a few weeks ago.  Pelvic ultrasound was obtained which showed prominent veins/venous congestion in the left adnexa, trace free fluid.  IUD was not seen within the endometrial canal. Follow-up CT abdomen/pelvis with contrast was obtained and showed acute diverticulitis/colitis of the mid to distal descending colon with small collection measuring 1.1 x 1.0 x 1.7 cm consistent with contained perforation.  A right ovarian dermoid measuring up to 5.3 cm was also seen.  IUD was seen in place within the retroverted uterus.  P patient then came to the hospital in the ED she was hemodynamically stable.  At the leukocytosis with a left lower quadrant tenderness with mildly elevated LFTs.  Patient was then admitted to hospital for conservative treatment.  General surgery was consulted. ? ? ?Assessment and Plan: ?* Acute diverticulitis of large intestine with contained perforation ?General surgery on board.  Was on clears.  Will be advanced to full liquids today.  Spoke with general surgery at bedside.  Leukocytosis has trended down to 9.4.  Continue Rocephin and Flagyl.  We will continue to monitor clinically. ? 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  Follow-up with gynecology recommended. ? ?ADD (attention deficit disorder) ?Patient takes Adderall as needed at home. ? ?Subjective:  ? ?Today, patient was seen and examined at bedside.  Complains of mild left lower quadrant pain but was able to tolerate clears.  Less pain compared to yesterday.   ? ? ?Physical Exam: ?Vitals:  ? 12/20/21 1343 12/20/21 2203 12/21/21 0136 12/21/21 0506  ?BP: 100/64 (!) 100/58 (!) 94/57  101/65  ?Pulse: 71 69 70 69  ?Resp: '18 16 16 16  '$ ?Temp: 97.9 ?F (36.6 ?C) 98.9 ?F (37.2 ?C) 98.4 ?F (36.9 ?C) 98.4 ?F (36.9 ?C)  ?TempSrc: Oral Oral Oral Oral  ?SpO2: 99% 100% 99% 99%  ?Weight:      ?Height:      ? ?General:  Average built, not in obvious distress ?HENT:   No scleral pallor or icterus noted. Oral mucosa is moist.  ?Chest:  Clear breath sounds.  Diminished breath sounds bilaterally. No crackles or wheezes.  ?CVS: S1 &S2 heard. No murmur.  Regular rate and rhythm. ?Abdomen: Soft, left lower quadrant tenderness on palpation.  Bowel sounds are heard.   ?Extremities: No cyanosis, clubbing or edema.  Peripheral pulses are palpable. ?Psych: Alert, awake and oriented, normal mood ?CNS:  No cranial nerve deficits.  Power equal in all extremities.   ?Skin: Warm and dry.  No rashes noted. ? ?Data Reviewed: ?Laboratory and radiology data over the last 24 hours were reviewed by me. ? ?Family Communication: Discussed with patient at bedside. ? ?Disposition: Home likely in 1 to 2 days. ? ?Consults General surgery. ? ?Antibiotics Rocephin and Flagyl ? ?Status is: Inpatient ? ?Remains inpatient appropriate because: Severity of illness, IV antibiotic, ? ? Planned Discharge Destination: Home ? ?Author: ?Flora Lipps, MD ?12/21/2021 9:35 AM ? ?For on call review [email or web address]  ?

## 2021-12-22 ENCOUNTER — Other Ambulatory Visit (HOSPITAL_COMMUNITY): Payer: Self-pay

## 2021-12-22 LAB — BASIC METABOLIC PANEL
Anion gap: 6 (ref 5–15)
BUN: 8 mg/dL (ref 6–20)
CO2: 24 mmol/L (ref 22–32)
Calcium: 8.4 mg/dL — ABNORMAL LOW (ref 8.9–10.3)
Chloride: 109 mmol/L (ref 98–111)
Creatinine, Ser: 0.65 mg/dL (ref 0.44–1.00)
GFR, Estimated: >60 mL/min (ref >60)
Glucose, Bld: 95 mg/dL (ref 70–99)
Potassium: 3.8 mmol/L (ref 3.5–5.1)
Sodium: 139 mmol/L (ref 135–145)

## 2021-12-22 LAB — MAGNESIUM: Magnesium: 2 mg/dL (ref 1.7–2.4)

## 2021-12-22 MED ORDER — POLYETHYLENE GLYCOL 3350 17 G PO PACK
17.0000 g | PACK | Freq: Every day | ORAL | 0 refills | Status: DC | PRN
Start: 2021-12-22 — End: 2022-02-16
  Filled 2021-12-22: qty 14, 14d supply, fill #0

## 2021-12-22 MED ORDER — POLYETHYLENE GLYCOL 3350 17 G PO PACK
17.0000 g | PACK | Freq: Every day | ORAL | Status: DC
Start: 2021-12-22 — End: 2021-12-22
  Administered 2021-12-22: 17 g via ORAL
  Filled 2021-12-22: qty 1

## 2021-12-22 MED ORDER — CIPROFLOXACIN HCL 500 MG PO TABS
500.0000 mg | ORAL_TABLET | Freq: Two times a day (BID) | ORAL | 0 refills | Status: DC
  Filled 2021-12-22: qty 24, 12d supply, fill #0

## 2021-12-22 MED ORDER — IBUPROFEN 600 MG PO TABS
600.0000 mg | ORAL_TABLET | Freq: Three times a day (TID) | ORAL | 0 refills | Status: DC | PRN
  Filled 2021-12-22: qty 15, 5d supply, fill #0

## 2021-12-22 MED ORDER — METRONIDAZOLE 500 MG PO TABS
500.0000 mg | ORAL_TABLET | Freq: Three times a day (TID) | ORAL | 0 refills | Status: DC
  Filled 2021-12-22: qty 36, 12d supply, fill #0

## 2021-12-22 MED ORDER — ACETAMINOPHEN 325 MG PO TABS: 650.0000 mg | ORAL_TABLET | Freq: Four times a day (QID) | ORAL | 0 refills | Status: AC | PRN

## 2021-12-22 NOTE — Progress Notes (Signed)
? ?  Subjective/Chief Complaint: ?Pt with no pain issues. ?Pt tol fulls ?No BMs ? ? ?Objective: ?Vital signs in last 24 hours: ?Temp:  [98.6 ?F (37 ?C)] 98.6 ?F (37 ?C) (05/03 2334) ?Pulse Rate:  [74-76] 76 (05/03 2334) ?Resp:  [18] 18 (05/03 2334) ?BP: (94-104)/(56-68) 94/56 (05/03 2334) ?SpO2:  [96 %-97 %] 96 % (05/03 2334) ?Last BM Date : 12/16/21 ? ?Intake/Output from previous day: ?05/03 0701 - 05/04 0700 ?In: 1080 [P.O.:780; IV Piggyback:300] ?Out: -  ?Intake/Output this shift: ?No intake/output data recorded. ? ?PE: ? ?Constitutional: No acute distress, conversant, appears states age. ?Eyes: Anicteric sclerae, moist conjunctiva, no lid lag ?Lungs: Clear to auscultation bilaterally, normal respiratory effort ?CV: regular rate and rhythm, no murmurs, no peripheral edema, pedal pulses 2+ ?GI: Soft, no masses or hepatosplenomegaly, minn-tender to palpation LLQ ?Skin: No rashes, palpation reveals normal turgor ?Psychiatric: appropriate judgment and insight, oriented to person, place, and time ? ?Lab Results:  ?Recent Labs  ?  12/21/21 ?0308 12/21/21 ?8250  ?WBC 9.4 8.4  ?HGB 11.2* 12.0  ?HCT 33.8* 35.9*  ?PLT 264 283  ? ?BMET ?Recent Labs  ?  12/21/21 ?0308 12/22/21 ?0324  ?NA 139 139  ?K 3.9 3.8  ?CL 108 109  ?CO2 24 24  ?GLUCOSE 87 95  ?BUN 7 8  ?CREATININE 0.69 0.65  ?CALCIUM 8.6* 8.4*  ? ?PT/INR ?No results for input(s): LABPROT, INR in the last 72 hours. ?ABG ?No results for input(s): PHART, HCO3 in the last 72 hours. ? ?Invalid input(s): PCO2, PO2 ? ?Studies/Results: ?No results found. ? ?Anti-infectives: ?Anti-infectives (From admission, onward)  ? ? Start     Dose/Rate Route Frequency Ordered Stop  ? 12/20/21 0800  metroNIDAZOLE (FLAGYL) IVPB 500 mg       ? 500 mg ?100 mL/hr over 60 Minutes Intravenous Every 12 hours 12/19/21 2220    ? 12/20/21 0400  cefTRIAXone (ROCEPHIN) 2 g in sodium chloride 0.9 % 100 mL IVPB       ? 2 g ?200 mL/hr over 30 Minutes Intravenous Every 24 hours 12/19/21 2220    ? 12/19/21  2015  cefTRIAXone (ROCEPHIN) 2 g in sodium chloride 0.9 % 100 mL IVPB  Status:  Discontinued       ? 2 g ?200 mL/hr over 30 Minutes Intravenous  Once 12/19/21 2009 12/19/21 2011  ? 12/19/21 2015  metroNIDAZOLE (FLAGYL) IVPB 500 mg       ? 500 mg ?100 mL/hr over 60 Minutes Intravenous  Once 12/19/21 2009 12/19/21 2218  ? 12/19/21 2015  ceFEPIme (MAXIPIME) 2 g in sodium chloride 0.9 % 100 mL IVPB       ? 2 g ?200 mL/hr over 30 Minutes Intravenous  Once 12/19/21 2013 12/19/21 2118  ? ?  ? ? ?Assessment/Plan: ?73F with diverticulitis and contained perforation ?-cont' abx.  Will need a 2 weeks course of abx. ?-Will adv diet to soft.  If she tol that today [place] for home later today. ?-will need f/u with GI for c-scope in 6-8 weeks ? ? LOS: 2 days  ? ? ?Ralene Ok ?12/22/2021 ? ?

## 2021-12-22 NOTE — Discharge Summary (Signed)
? ?Physician Discharge Summary  ?Lauren Franklin AYT:[id number] DOB: Mar 06, 1981 DOA: 12/19/2021 ? ?PCP: Hali Marry, MD ? ?Admit date: 12/19/2021 ?Discharge date: 12/22/2021 ? ?Admitted From: Home ? ?Discharge disposition: Home ? ? ?Recommendations for Outpatient Follow-Up:  ? ?Follow up with your primary care provider in one week.  ?Check CBC, BMP, magnesium in the next visit ?Patient will need GI evaluation with colonoscopy in 6 to 8 weeks after resolution of diverticulitis. ?Patient does have dermoid cyst in the right ovary.  This will need to be followed up by gynecology as outpatient. ? ? ?Discharge Diagnosis:  ? ?Principal Problem: ?  Acute diverticulitis of large intestine with contained perforation ?Active Problems: ?  Dermoid cyst of ovary, right ?  ADD (attention deficit disorder) ? ? ?Discharge Condition: Improved. ? ?Diet recommendation: Soft diet. ? ?Wound care: None. ? ?Code status: Full. ? ? ?History of Present Illness:  ? ?Lauren Franklin is a 41 y.o. female with medical history significant for ADHD who presented to hospital with left lower quadrant pain.   She had a Mirena IUD placed a few weeks ago.  Pelvic ultrasound was obtained which showed prominent veins/venous congestion in the left adnexa, trace free fluid.  IUD was not seen within the endometrial canal. Follow-up CT abdomen/pelvis with contrast was obtained and showed acute diverticulitis/colitis of the mid to distal descending colon with small collection measuring 1.1 x 1.0 x 1.7 cm consistent with contained perforation.  A right ovarian dermoid measuring up to 5.3 cm was also seen.  IUD was seen in place within the retroverted uterus.  Patient then came to the hospital. In the ED, she was hemodynamically stable.  Had leukocytosis with a left lower quadrant tenderness with mildly elevated LFTs.  Patient was then admitted to hospital for further evaluation and treatment.  General surgery was consulted. ? ?Hospital Course:   ? ?Following conditions were addressed during hospitalization as listed below, ? ?Assessment and Plan: ? ?* Acute diverticulitis of large intestine with contained perforation ?General surgery was consulted.  Patient was treated conservatively with antibiotics, analgesics and supportive care.  At this time, patient has been advanced to soft diet and has tolerated.  Patient is afebrile without leukocytosis.  Has improved symptoms including significantly improved tenderness.  Patient received  Rocephin and Flagyl during hospitalization.  Patient will continue on oral antibiotics ciprofloxacin and Flagyl for next 12 days to complete the 2-week course on discharge. General surgery has recommended follow-up with GI for colonoscopy in 6 to 8 weeks. ?  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  Follow-up with gynecology recommended. ?  ?ADD (attention deficit disorder) ?Patient takes Adderall as needed at home. ? ?Disposition.  At this time, patient is stable for disposition home with outpatient PCP, GI and gynecology follow-up ? ?Medical Consultants:  ? ?General surgery ? ?Procedures: ?  ? ?None ?Subjective:  ? ?Today, patient was seen and examined at bedside.  Continues to feel better.  Denies any nausea vomiting.  Minimal abdominal pain.  Has not had a bowel movement but passing gas. ? ?Discharge Exam:  ? ?Vitals:  ? 12/21/21 1342 12/21/21 2334  ?BP: 104/68 (!) 94/56  ?Pulse: 74 76  ?Resp: 18 18  ?Temp: 98.6 ?F (37 ?C) 98.6 ?F (37 ?C)  ?SpO2: 97% 96%  ? ?Vitals:  ? 12/21/21 0136 12/21/21 0506 12/21/21 1342 12/21/21 2334  ?BP: (!) 94/57 101/65 104/68 (!) 94/56  ?Pulse:  70 69 74 76  ?Resp: '16 16 18 18  '$ ?Temp: 98.4 ?F (36.9 ?C) 98.4 ?F (36.9 ?C) 98.6 ?F (37 ?C) 98.6 ?F (37 ?C)  ?TempSrc: Oral Oral  Oral  ?SpO2: 99% 99% 97% 96%  ?Weight:      ?Height:      ? ? ?General: Alert awake, not in obvious distress ?HENT: pupils equally reacting to light,  No scleral  pallor or icterus noted. Oral mucosa is moist.  ?Chest:  Clear breath sounds.  Diminished breath sounds bilaterally. No crackles or wheezes.  ?CVS: S1 &S2 heard. No murmur.  Regular rate and rhythm. ?Abdomen: Soft, nontender, nondistended.  Bowel sounds are heard.  Mild tenderness on the palpation on the left lower quadrant. ?Extremities: No cyanosis, clubbing or edema.  Peripheral pulses are palpable. ?Psych: Alert, awake and oriented, normal mood ?CNS:  No cranial nerve deficits.  Power equal in all extremities.   ?Skin: Warm and dry.  No rashes noted. ? ?The results of significant diagnostics from this hospitalization (including imaging, microbiology, ancillary and laboratory) are listed below for reference.   ? ? ?Diagnostic Studies:  ? ?CT Abdomen Pelvis W Contrast ? ?Result Date: 12/19/2021 ?CLINICAL DATA:  LLQ abdominal pain EXAM: CT ABDOMEN AND PELVIS WITH CONTRAST TECHNIQUE: Multidetector CT imaging of the abdomen and pelvis was performed using the standard protocol following bolus administration of intravenous contrast. RADIATION DOSE REDUCTION: This exam was performed according to the departmental dose-optimization program which includes automated exposure control, adjustment of the mA and/or kV according to patient size and/or use of iterative reconstruction technique. CONTRAST:  14m OMNIPAQUE IOHEXOL 300 MG/ML  SOLN COMPARISON:  None. FINDINGS: Lower chest: No acute abnormality. Hepatobiliary: No focal liver abnormality is seen. Prior cholecystectomy. Pancreas: Unremarkable. No pancreatic ductal dilatation or surrounding inflammatory changes. Spleen: Normal in size without focal abnormality. Adrenals/Urinary Tract: Adrenal glands are unremarkable. No hydronephrosis or nephrolithiasis. The bladder is minimally distended. Stomach/Bowel: The stomach is within normal limits. There is no evidence of bowel obstruction.The appendix is normal. There is wall thickening and inflammatory stranding along the mid to  distal descending colon. There is a small adjacent collection measuring 1.1 x 1.0 x 1.7 cm along the medial aspect near the descending-sigmoid junction consistent with a contained perforation (series 2, image 49). Vascular/Lymphatic: There is an pelvic vascular congestion, left greater than right. No AAA. No lymphadenopathy. Reproductive: There is an IUD noted within a retroverted uterus. There is a fat containing mass in the right adnexa with soft tissue component measuring up to 5.3 x 3.8 x 4.6 cm. Other: Trace free fluid in the pelvis, nonspecific. No focal/drainable fluid collection. Musculoskeletal: No acute or significant osseous findings. IMPRESSION: Acute diverticulitis/colitis of the mid to distal descending colon, with adjacent small collection along the distal aspect measuring 1.1 x 1.0 x 1.7 cm consistent with a contained perforation. 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 Recommend gyn referral/consultation. IUD is in place within a retroverted uterus. These results will be called to the ordering clinician or representative by the Radiologist Assistant, and communication documented in the PACS or [employer] Electronically Signed   By: JMaurine SimmeringM.D.   On: 12/19/2021 16:38  ? ?[place]elvic Complete With Transvaginal ? ?Result Date: 12/19/2021 ?CLINICAL DATA:  Left lower quadrant pain. Mirena IUD placed 11/14/2021. EXAM: TRANSABDOMINAL AND TRANSVAGINAL ULTRASOUND OF PELVIS TECHNIQUE: Both transabdominal and transvaginal ultrasound examinations of the pelvis were performed. Transabdominal technique was performed for global imaging of the pelvis including uterus,  ovaries, adnexal regions, and pelvic cul-de-sac. It was necessary to proceed with endovaginal exam following the transabdominal exam to visualize the uterus, endometrium, ovaries and adnexal regions. COMPARISON:  None FINDINGS: Uterus Measurements: 9.1 x 5.3 x 6.0 cm = volume: 153 mL. No fibroids or other mass visualized. Endometrium  Thickness: 3 mm, within normal limits. Intrauterine contraceptive device is not seen within the endometrial canal. Right ovary Measurements: 3.5 x 2.2 x 1.5 cm = volume: 6.2 mL. Normal appearance/no adnexal mas

## 2021-12-26 ENCOUNTER — Telehealth: Payer: Self-pay | Admitting: *Deleted

## 2021-12-26 NOTE — Chronic Care Management (AMB) (Unsigned)
?  Care Management  ? ?Outreach Note ? ?12/26/2021 ?Name: Lauren Franklin MRN: [id number] DOB: March 18, 1981 ? ?Referred by: Hali Marry, MD ?Reason for referral : Care Coordination (Initial outreach to schedule initial with RNCM ) ? ? ?An unsuccessful telephone outreach was attempted today. The patient was referred to the case management team for assistance with care management and care coordination.  ? ?Follow Up Plan:  ?A HIPAA compliant phone message was left for the patient providing contact information and requesting a return call.  ? ?[name], CCMA ?Care Guide, Embedded Care Coordination ?[place]  Care Management  ?Direct Dial: [phone] ? ? ?

## 2021-12-28 ENCOUNTER — Encounter: Payer: Self-pay | Admitting: Family Medicine

## 2021-12-28 ENCOUNTER — Ambulatory Visit (INDEPENDENT_AMBULATORY_CARE_PROVIDER_SITE_OTHER): Payer: No Typology Code available for payment source | Admitting: Family Medicine

## 2021-12-28 VITALS — BP 101/66 | HR 71 | Ht 65.0 in | Wt 120.0 lb

## 2021-12-28 DIAGNOSIS — D649 Anemia, unspecified: Secondary | ICD-10-CM

## 2021-12-28 DIAGNOSIS — D270 Benign neoplasm of right ovary: Secondary | ICD-10-CM

## 2021-12-28 DIAGNOSIS — K5732 Diverticulitis of large intestine without perforation or abscess without bleeding: Secondary | ICD-10-CM | POA: Diagnosis not present

## 2021-12-28 NOTE — Progress Notes (Signed)
? ?Established Patient Office Visit ? ?Subjective   ?Patient ID: Lauren Franklin, female    DOB: 02/21/1981  Age: 41 y.o. MRN: [id number] ? ?Chief Complaint  ?Patient presents with  ? Hospitalization Follow-up  ? ? ?HPI ? ?Here today for hospital follow-up.  She was seen in the office on May 1 with left lower quadrant pain and guarding.  Ultrasound was unrevealing for an acute process so CT was obtained which showed a contained perforated diverticulum.  She was sent to the hospital and admitted.  She was discharged home 3 days later on May 4.  She was placed on bowel rest and IV antibiotics.  He was discharged home on metronidazole and ciprofloxacin.  She is still completing her antibiotics.  She is overall feeling much better.  It was recommended that she have a colonoscopy in 6 to 8 weeks after resolution of the diverticulitis.  So she will need a referral.  The discharging physician also recommended that she have a follow-up CBC, BMP and magnesium at the next office visit.  Calcium was noted to be low at discharge.  And she was also noted to be anemic upon admission. ? ?She was also noted on the initial ultrasound to have a dermoid cyst of the right ovary that also needs to be evaluated outpatient by gynecology. ? ?He is actually feeling significantly better still just tired.  Afebrile.  She will get some occasional just brief pain in that left lower quadrant but nothing persistent and some occasional pain in the right lower quadrant.  She has been trying to walk after lunchtime and she is eating normally again.  Appetite has been back to normal.  She has been also drinking more water.  Last bowel movement was yesterday and they have been soft. ? ? ? ?ROS ? ?  ?Objective:  ?  ? ?BP 101/66   Pulse 71   Ht '5\' 5"'$  (1.651 m)   Wt 120 lb (54.4 kg)   SpO2 100%   BMI 19.97 kg/m?  ? ? ?Physical Exam ?Vitals and nursing note reviewed.  ?Constitutional:   ?   Appearance: She is well-developed.  ?HENT:  ?   Head:  Normocephalic and atraumatic.  ?Cardiovascular:  ?   Rate and Rhythm: Normal rate and regular rhythm.  ?   Heart sounds: Normal heart sounds.  ?Pulmonary:  ?   Effort: Pulmonary effort is normal.  ?   Breath sounds: Normal breath sounds.  ?Abdominal:  ?   General: Abdomen is flat. Bowel sounds are normal.  ?   Palpations: Abdomen is soft.  ?   Tenderness: There is no abdominal tenderness.  ?Skin: ?   General: Skin is warm and dry.  ?Neurological:  ?   Mental Status: She is alert and oriented to person, place, and time.  ?Psychiatric:     ?   Behavior: Behavior normal.  ? ? ? ?No results found for any visits on 12/28/21. ? ? ? ?The 10-year ASCVD risk score (Arnett DK, et al., 2019) is: 0.2% ? ?  ?Assessment & Plan:  ? ?Problem List Items Addressed This Visit   ? ?  ? Digestive  ? Acute diverticulitis of large intestine with contained perforation - Primary  ? Relevant Orders  ? CBC  ? Basic Metabolic Panel (BMET)  ? Magnesium  ? Ambulatory referral to Obstetrics / Gynecology  ? Ambulatory referral to Gastroenterology  ?  ? Endocrine  ? Dermoid cyst of ovary, right  ?  Relevant Orders  ? CBC  ? Basic Metabolic Panel (BMET)  ? Magnesium  ? Ambulatory referral to Obstetrics / Gynecology  ? Ambulatory referral to Gastroenterology  ? ?Other Visit Diagnoses   ? ? Hypocalcemia      ? Relevant Orders  ? CBC  ? Basic Metabolic Panel (BMET)  ? Magnesium  ? Ambulatory referral to Obstetrics / Gynecology  ? Ambulatory referral to Gastroenterology  ? Low hemoglobin      ? Relevant Orders  ? CBC  ? Basic Metabolic Panel (BMET)  ? Magnesium  ? Ambulatory referral to Obstetrics / Gynecology  ? Ambulatory referral to Gastroenterology  ? ?  ? ?Acute diverticulitis-she is feeling significantly better still occasional discomfort.  Appetite is back to normal.  EXTR to complete antibiotics.  We will repeat labs as requested.  Will need follow-up colonoscopy in about 6 to 8 weeks and will place GI referral it does not look like she has been  scheduled yet. ? ?Cyst of right ovary, possible dermoid-refer to GYN for further evaluation and consultation ? ?Hypocalcemia-due to recheck calcium levels. ? ?No follow-ups on file.  ? ? ?Beatrice Lecher, MD ? ?

## 2021-12-29 ENCOUNTER — Other Ambulatory Visit: Payer: Self-pay | Admitting: *Deleted

## 2021-12-29 DIAGNOSIS — R899 Unspecified abnormal finding in specimens from other organs, systems and tissues: Secondary | ICD-10-CM

## 2021-12-29 LAB — MAGNESIUM: Magnesium: 1.9 mg/dL (ref 1.5–2.5)

## 2021-12-29 LAB — BASIC METABOLIC PANEL
BUN: 17 mg/dL (ref 7–25)
CO2: 23 mmol/L (ref 20–32)
Calcium: 9 mg/dL (ref 8.6–10.2)
Chloride: 105 mmol/L (ref 98–110)
Creat: 0.82 mg/dL (ref 0.50–0.99)
Glucose, Bld: 91 mg/dL (ref 65–99)
Potassium: 4.5 mmol/L (ref 3.5–5.3)
Sodium: 140 mmol/L (ref 135–146)

## 2021-12-29 LAB — CBC
HCT: 36.5 % (ref 35.0–45.0)
Hemoglobin: 12.3 g/dL (ref 11.7–15.5)
MCH: 31.4 pg (ref 27.0–33.0)
MCHC: 33.7 g/dL (ref 32.0–36.0)
MCV: 93.1 fL (ref 80.0–100.0)
MPV: 10.1 fL (ref 7.5–12.5)
Platelets: 454 10*3/uL — ABNORMAL HIGH (ref 140–400)
RBC: 3.92 10*6/uL (ref 3.80–5.10)
RDW: 12.7 % (ref 11.0–15.0)
WBC: 11.7 10*3/uL — ABNORMAL HIGH (ref 3.8–10.8)

## 2021-12-29 NOTE — Progress Notes (Signed)
We can recheck in about 10 days.

## 2021-12-29 NOTE — Progress Notes (Signed)
Hi Lauren Franklin, your labs do look reassuring.  White cells are just slightly elevated.  But I really think that that is okay.  Probably just from remaining inflammation.

## 2022-01-02 ENCOUNTER — Encounter: Payer: Self-pay | Admitting: Family Medicine

## 2022-01-03 NOTE — Progress Notes (Signed)
GYNECOLOGY OFFICE VISIT NOTE  History:   Lauren Franklin is a 41 y.o. G4P4 here today for discussion of right 5 cm dermoid. She went to ED on 12/19/21 with LLQ and found to have right dermoid on CT BUT ultrasound shows no such dermoid on 5/1. However on CT, also noted a acute diverticulitis/colitis adjacent to the descending colon c/w contained perforation. She had IV antibiotics and was able to be discharged home without surgery. She is to have follow up colonoscopy 6-8 weeks from then. She did have occasional right sided pain but it was largely overshadowed by the left sided pain. The right sided pain felt like twinges.   She has an IUD for birth control that was recently replaced by me.   She denies any abnormal vaginal discharge, bleeding, pelvic pain or other concerns.     Past Medical History:  Diagnosis Date   ADD (attention deficit disorder)    ADHD (attention deficit hyperactivity disorder)    IUD    mirena   Vaginal Pap smear, abnormal    Viral gastroenteritis 07/12/2015    Past Surgical History:  Procedure Laterality Date   CHOLECYSTECTOMY  2000    The following portions of the patient's history were reviewed and updated as appropriate: allergies, current medications, past family history, past medical history, past social history, past surgical history and problem list.   Health Maintenance:   Diagnosis  Date Value Ref Range Status  03/20/2019   Final   NEGATIVE FOR INTRAEPITHELIAL LESIONS OR MALIGNANCY. BENIGN REACTIVE/REPARATIVE CHANGES.   Negative HPV   Review of Systems:  Pertinent items noted in HPI and remainder of comprehensive ROS otherwise negative.  Physical Exam:  BP 113/78   Pulse 93   Ht '5\' 4"'$  (1.626 m)   Wt 120 lb (54.4 kg)   BMI 20.60 kg/m  CONSTITUTIONAL: Well-developed, well-nourished female in no acute distress.  HEENT:  Normocephalic, atraumatic. External right and left ear normal. No scleral icterus.  NECK: Normal range of motion,  supple, no masses noted on observation SKIN: No rash noted. Not diaphoretic. No erythema. No pallor. MUSCULOSKELETAL: Normal range of motion. No edema noted. NEUROLOGIC: Alert and oriented to person, place, and time. Normal muscle tone coordination. No cranial nerve deficit noted. PSYCHIATRIC: Normal mood and affect. Normal behavior. Normal judgment and thought content.  CARDIOVASCULAR: Normal heart rate noted RESPIRATORY: Effort normal, no problems with respiration noted   PELVIC: Deferred  Labs and Imaging No results found for this or any previous visit (from the past 168 hour(s)).  CT Abdomen Pelvis W Contrast  Result Date: 12/19/2021 CLINICAL DATA:  LLQ abdominal pain EXAM: CT ABDOMEN AND PELVIS WITH CONTRAST TECHNIQUE: Multidetector CT imaging of the abdomen and pelvis was performed using the standard protocol following bolus administration of intravenous contrast. RADIATION DOSE REDUCTION: This exam was performed according to the departmental dose-optimization program which includes automated exposure control, adjustment of the mA and/or kV according to patient size and/or use of iterative reconstruction technique. CONTRAST:  168m OMNIPAQUE IOHEXOL 300 MG/ML  SOLN COMPARISON:  None. FINDINGS: Lower chest: No acute abnormality. Hepatobiliary: No focal liver abnormality is seen. Prior cholecystectomy. Pancreas: Unremarkable. No pancreatic ductal dilatation or surrounding inflammatory changes. Spleen: Normal in size without focal abnormality. Adrenals/Urinary Tract: Adrenal glands are unremarkable. No hydronephrosis or nephrolithiasis. The bladder is minimally distended. Stomach/Bowel: The stomach is within normal limits. There is no evidence of bowel obstruction.The appendix is normal. There is wall thickening and inflammatory stranding along the mid  to distal descending colon. There is a small adjacent collection measuring 1.1 x 1.0 x 1.7 cm along the medial aspect near the descending-sigmoid  junction consistent with a contained perforation (series 2, image 49). Vascular/Lymphatic: There is an pelvic vascular congestion, left greater than right. No AAA. No lymphadenopathy. Reproductive: There is an IUD noted within a retroverted uterus. There is a fat containing mass in the right adnexa with soft tissue component measuring up to 5.3 x 3.8 x 4.6 cm. Other: Trace free fluid in the pelvis, nonspecific. No focal/drainable fluid collection. Musculoskeletal: No acute or significant osseous findings. IMPRESSION: Acute diverticulitis/colitis of the mid to distal descending colon, with adjacent small collection along the distal aspect measuring 1.1 x 1.0 x 1.7 cm consistent with a contained perforation. 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 Recommend gyn referral/consultation. IUD is in place within a retroverted uterus. These results will be called to the ordering clinician or representative by the Radiologist Assistant, and communication documented in the PACS or [employer]. Electronically Signed   By: Maurine Simmering M.D.   On: 12/19/2021 16:38   US Pelvic Complete With Transvaginal  Result Date: 12/19/2021 CLINICAL DATA:  Left lower quadrant pain. Mirena IUD placed 11/14/2021. EXAM: TRANSABDOMINAL AND TRANSVAGINAL ULTRASOUND OF PELVIS TECHNIQUE: Both transabdominal and transvaginal ultrasound examinations of the pelvis were performed. Transabdominal technique was performed for global imaging of the pelvis including uterus, ovaries, adnexal regions, and pelvic cul-de-sac. It was necessary to proceed with endovaginal exam following the transabdominal exam to visualize the uterus, endometrium, ovaries and adnexal regions. COMPARISON:  None FINDINGS: Uterus Measurements: 9.1 x 5.3 x 6.0 cm = volume: 153 mL. No fibroids or other mass visualized. Endometrium Thickness: 3 mm, within normal limits. Intrauterine contraceptive device is not seen within the endometrial canal. Right ovary Measurements: 3.5 x  2.2 x 1.5 cm = volume: 6.2 mL. Normal appearance/no adnexal mass. Left ovary Measurements: 3.7 x 1.1 x 1.6 cm = volume: 3.3 mL. Normal appearance/no adnexal mass. Other findings Trace pelvic free fluid.  Dilated veins are seen in left adnexa. IMPRESSION: 1. Intrauterine contraceptive device is not seen within the endometrial canal. 2. Trace free fluid. 3. Prominent veins/venous congestion in the left adnexa. Electronically Signed   By: Lorin Picket M.D.   On: 12/19/2021 11:39    Assessment and Plan:   1. Dermoid cyst of right ovary? Would repeat the [place] after resolution confirmed by colonoscopy of the bowel perforation. If seen on that [place], would then recommend surgery for removal of the dermoid. Reviewed would best wait to allow inflammation to resolve before going in surgically. Additional if not recovered and/or bowel surgery is needed for some reason these surgeries could then be coordinated.   We discussed removal of the ovary vs removal of the cyst. Reviewed risk of cyst rupture and risk of chemical peritonitis if a dermoid. She would prefer removal of the ovary. We also discussed the option of removal of both tubes at the time of surgery for ovarian risk reduction and option for birth control since this would not add incremental risk to the surgery. She would like this so that if she has any reason to have IUD out, she does not have to do anything else.   She will get her [place] after her colonoscopy as noted above and then we will talk from there and schedule surgery if/as indicated.     Routine preventative health maintenance measures emphasized. Please refer to After Visit Summary for other  counseling recommendations.   Return if symptoms worsen or fail to improve, for pelvic ultrasound - after colonoscopy.  Radene Gunning, MD, [place] for [facility], [place]

## 2022-01-05 ENCOUNTER — Encounter: Payer: Self-pay | Admitting: Obstetrics and Gynecology

## 2022-01-05 ENCOUNTER — Encounter: Payer: Self-pay | Admitting: Internal Medicine

## 2022-01-05 ENCOUNTER — Ambulatory Visit (INDEPENDENT_AMBULATORY_CARE_PROVIDER_SITE_OTHER): Payer: No Typology Code available for payment source | Admitting: Obstetrics and Gynecology

## 2022-01-05 VITALS — BP 113/78 | HR 93 | Ht 64.0 in | Wt 120.0 lb

## 2022-01-05 DIAGNOSIS — D270 Benign neoplasm of right ovary: Secondary | ICD-10-CM | POA: Diagnosis not present

## 2022-01-06 ENCOUNTER — Encounter: Payer: Self-pay | Admitting: *Deleted

## 2022-01-06 ENCOUNTER — Telehealth: Payer: Self-pay | Admitting: Family Medicine

## 2022-01-06 DIAGNOSIS — K5732 Diverticulitis of large intestine without perforation or abscess without bleeding: Secondary | ICD-10-CM

## 2022-01-06 LAB — BASIC METABOLIC PANEL WITH GFR
BUN: 8 mg/dL (ref 7–25)
CO2: 24 mmol/L (ref 20–32)
Calcium: 9.2 mg/dL (ref 8.6–10.2)
Chloride: 103 mmol/L (ref 98–110)
Creat: 0.81 mg/dL (ref 0.50–0.99)
Glucose, Bld: 112 mg/dL — ABNORMAL HIGH (ref 65–99)
Potassium: 4.1 mmol/L (ref 3.5–5.3)
Sodium: 138 mmol/L (ref 135–146)
eGFR: 93 mL/min/{1.73_m2} (ref >=60)

## 2022-01-06 LAB — CBC WITH DIFFERENTIAL/PLATELET
Absolute Monocytes: 1119 cells/uL — ABNORMAL HIGH (ref 200–950)
Basophils Absolute: 94 cells/uL (ref 0–200)
Basophils Relative: 1 %
Eosinophils Absolute: 996 cells/uL — ABNORMAL HIGH (ref 15–500)
Eosinophils Relative: 10.6 %
HCT: 38.4 % (ref 35.0–45.0)
Hemoglobin: 12.6 g/dL (ref 11.7–15.5)
Lymphs Abs: 1683 cells/uL (ref 850–3900)
MCH: 30.9 pg (ref 27.0–33.0)
MCHC: 32.8 g/dL (ref 32.0–36.0)
MCV: 94.1 fL (ref 80.0–100.0)
MPV: 10.1 fL (ref 7.5–12.5)
Monocytes Relative: 11.9 %
Neutro Abs: 5508 cells/uL (ref 1500–7800)
Neutrophils Relative %: 58.6 %
Platelets: 387 10*3/uL (ref 140–400)
RBC: 4.08 10*6/uL (ref 3.80–5.10)
RDW: 12.8 % (ref 11.0–15.0)
Total Lymphocyte: 17.9 %
WBC: 9.4 10*3/uL (ref 3.8–10.8)

## 2022-01-06 NOTE — Telephone Encounter (Signed)
Spoke with patient this morning.  She is actually not feeling well she ran a fever last night and is woke up with congestion and sore throat today.  Her daughter and her husband have been sick so she thinks she may have got it from them.  But she is also been still having some intermittent intensely sharp left abdominal pain having had diverticulitis recently.  She just finished up her ciprofloxacin about a day ago and is still on her other antibiotics.  She says last night getting out of the vehicle it hit her so hard that she had to stop.  It lasted for maybe 15 seconds and then eased off it did feel little bit sore afterwards.  Again I am concerned with recent episode of diverticulitis 2 weeks ago with perforation.  I would like to at least get some stat labs today including a CBC.  Though it does sound like she is also getting a concomitant upper respiratory infection with a fever which could be related to the URI or could be related to the abdominal pain.  Consider repeat imaging if needed.

## 2022-01-06 NOTE — Progress Notes (Signed)
Hi Jen, the white blood cell count is normal which looks reassuring.  If you have any more pain today let me know and we will get a CT ordered.  Make sure you are staying really well-hydrated today especially since she do not feel great.

## 2022-01-10 ENCOUNTER — Ambulatory Visit (INDEPENDENT_AMBULATORY_CARE_PROVIDER_SITE_OTHER): Payer: No Typology Code available for payment source | Admitting: Internal Medicine

## 2022-01-10 ENCOUNTER — Encounter: Payer: Self-pay | Admitting: Internal Medicine

## 2022-01-10 ENCOUNTER — Other Ambulatory Visit (HOSPITAL_BASED_OUTPATIENT_CLINIC_OR_DEPARTMENT_OTHER): Payer: Self-pay

## 2022-01-10 VITALS — BP 100/60 | HR 83 | Ht 64.0 in | Wt 123.0 lb

## 2022-01-10 DIAGNOSIS — K5792 Diverticulitis of intestine, part unspecified, without perforation or abscess without bleeding: Secondary | ICD-10-CM | POA: Diagnosis not present

## 2022-01-10 DIAGNOSIS — K625 Hemorrhage of anus and rectum: Secondary | ICD-10-CM

## 2022-01-10 MED ORDER — PLENVU 140 G PO SOLR
1.0000 | ORAL | 0 refills | Status: DC
  Filled 2022-01-10: qty 3, 1d supply, fill #0

## 2022-01-10 NOTE — Progress Notes (Signed)
Chief Complaint: Perforated diverticulum  HPI : 41 year old female with history of diverticulitis or ADHD presents with perforated diverticulitis  She had ab pain LLQ last month and was found to have a perforated diverticulitis for which she was admitted 5/1 to 5/4.  She was treated with antibiotic therapy at that time and was monitored by surgery during the course of her hospitalization.  She has noted occasional bleeding when she wipes with toilet paper. She has recently completed a course of antibiotics for diverticulitis. Prior to the diagnosis of her diverticulitis, she would go to the bathroom to have a BM once or twice a week. Nowadays after she completed her course of antibiotics, she has on average one BM per day that are loose. She is not having any LLQ ab pain currently. Denies N&V, dysphagia, weight loss. Denies fam hx of colon cancer. Denies prior colonoscopy. Has gallbladder removed in the past.  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  Past Medical History:  Diagnosis Date   ADD (attention deficit disorder)    ADHD (attention deficit hyperactivity disorder)    Cyst, ovary, dermoid    Diverticulitis    Hypocalcemia    IUD    mirena   Low hemoglobin    Vaginal Pap smear, abnormal    Viral gastroenteritis 07/12/2015     Past Surgical History:  Procedure Laterality Date   CHOLECYSTECTOMY  2000   Family History  Problem Relation Age of Onset   Cancer Maternal Grandmother        ovarian or cervical   Diabetes Other    Colon cancer Neg Hx    Esophageal cancer Neg Hx    Social History   Tobacco Use   Smoking status: Former    Types: Cigarettes    Quit date: 09/26/2019    Years since quitting: 2.2   Smokeless tobacco: Never   Tobacco comments:    3 cig per day. Doesnt smoke at home.   Vaping Use   Vaping Use: Former  Substance Use Topics   Alcohol use: Yes    Comment: social   Drug use: No   Current Outpatient  Medications  Medication Sig Dispense Refill   amphetamine-dextroamphetamine (ADDERALL) 20 MG tablet TAKE 1 TABLET BY MOUTH TWICE DAILY (Patient taking differently: Take 20 mg by mouth 2 (two) times daily as needed (for help with focusing).) 180 tablet 0   ibuprofen (ADVIL) 600 MG tablet Take 1 tablet (600 mg total) by mouth every 8 (eight) hours with food as needed for moderate pain. 15 tablet 0   levonorgestrel (MIRENA) 20 MCG/24HR IUD 1 each by Intrauterine route once.     PEG-KCl-NaCl-NaSulf-Na Asc-C (PLENVU) 140 g SOLR Take 1 kit by mouth as directed 3 each 0   polyethylene glycol (MIRALAX / GLYCOLAX) 17 g packet Take 17 g by mouth daily as needed for mild constipation or moderate constipation. 14 each 0   No current facility-administered medications for this visit.   Allergies  Allergen Reactions   Penicillins Itching     Review of Systems: All systems reviewed and negative except where noted in HPI.   Physical Exam: BP 100/60   Pulse 83   Ht 5\' 4"  (1.626 m)   Wt 123 lb (55.8 kg)   BMI 21.11 kg/m  Constitutional: Pleasant,well-developed, female in no acute distress. HEENT: Normocephalic and atraumatic. Conjunctivae are normal. No scleral icterus. Cardiovascular: Normal rate, regular rhythm.  Pulmonary/chest: Effort normal  and breath sounds normal. No wheezing, rales or rhonchi. Abdominal: Soft, nondistended, nontender. Bowel sounds active throughout. There are no masses palpable. No hepatomegaly. Extremities: No edema Neurological: Alert and oriented to person place and time. Skin: Skin is warm and dry. No rashes noted. Psychiatric: Normal mood and affect. Behavior is normal.  Labs 12/2021: CBC and BMP unremarkable  Pelvic U/S 12/19/21: IMPRESSION: 1. Intrauterine contraceptive device is not seen within the endometrial canal. 2. Trace free fluid. 3. Prominent veins/venous congestion in the left adnexa  CT A/P w/contrast 12/19/21: IMPRESSION: Acute diverticulitis/colitis  of the mid to distal descending colon, with adjacent small collection along the distal aspect measuring 1.1 x 1.0 x 1.7 cm consistent with a contained perforation. 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 Recommend gyn referral/consultation. IUD is in place within a retroverted uterus.  ASSESSMENT AND PLAN: Perforated diverticulitis Rectal bleeding Patient presents with an episode of perforated diverticulitis earlier this month.  Her abdominal pain has since then completely resolved.  She completed her full course of antibiotics for diverticulitis.  I discussed the risks and benefits of completing the colonoscopy procedure with the patient.  She has agreed to proceed with colonoscopy at this time. - Colonoscopy [facility]. Will plan for colonoscopy 8 weeks after her diagnosis of diverticulitis.  Christia Reading, MD

## 2022-01-10 NOTE — Patient Instructions (Signed)
You have been scheduled for a colonoscopy. Please follow written instructions given to you at your visit today.  Please pick up your prep supplies at the pharmacy within the next 1-3 days. If you use inhalers (even only as needed), please bring them with you on the day of your procedure.  If you are age 41 or older, your body mass index should be between 23-30. Your Body mass index is 21.11 kg/m. If this is out of the aforementioned range listed, please consider follow up with your Primary Care Provider.  If you are age 14 or younger, your body mass index should be between 19-25. Your Body mass index is 21.11 kg/m. If this is out of the aformentioned range listed, please consider follow up with your Primary Care Provider.   ________________________________________________________  The [place] [facility] providers would like to encourage you to use [facility] to communicate with providers for non-urgent requests or questions.  Due to long hold times on the telephone, sending your provider a message by [facility] may be a faster and more efficient way to get a response.  Please allow 48 business hours for a response.  Please remember that this is for non-urgent requests.  _______________________________________________________  Due to recent changes in healthcare laws, you may see the results of your imaging and laboratory studies on MyChart before your provider has had a chance to review them.  We understand that in some cases there may be results that are confusing or concerning to you. Not all laboratory results come back in the same time frame and the provider may be waiting for multiple results in order to interpret others.  Please give [place] 48 hours in order for your provider to thoroughly review all the results before contacting the office for clarification of your results.

## 2022-01-13 ENCOUNTER — Ambulatory Visit (INDEPENDENT_AMBULATORY_CARE_PROVIDER_SITE_OTHER): Payer: No Typology Code available for payment source | Admitting: Family Medicine

## 2022-01-13 ENCOUNTER — Ambulatory Visit (INDEPENDENT_AMBULATORY_CARE_PROVIDER_SITE_OTHER): Payer: No Typology Code available for payment source

## 2022-01-13 VITALS — BP 106/72 | HR 95 | Temp 98.6°F | Resp 16 | Ht 64.0 in | Wt 118.0 lb

## 2022-01-13 DIAGNOSIS — R509 Fever, unspecified: Secondary | ICD-10-CM | POA: Diagnosis not present

## 2022-01-13 DIAGNOSIS — R1032 Left lower quadrant pain: Secondary | ICD-10-CM | POA: Diagnosis not present

## 2022-01-13 DIAGNOSIS — K5792 Diverticulitis of intestine, part unspecified, without perforation or abscess without bleeding: Secondary | ICD-10-CM | POA: Diagnosis not present

## 2022-01-13 LAB — CBC WITH DIFFERENTIAL/PLATELET
Absolute Monocytes: 693 cells/uL (ref 200–950)
Basophils Absolute: 44 cells/uL (ref 0–200)
Basophils Relative: 0.4 %
Eosinophils Absolute: 11 cells/uL — ABNORMAL LOW (ref 15–500)
Eosinophils Relative: 0.1 %
HCT: 36.9 % (ref 35.0–45.0)
Hemoglobin: 12.3 g/dL (ref 11.7–15.5)
Lymphs Abs: 891 cells/uL (ref 850–3900)
MCH: 31.5 pg (ref 27.0–33.0)
MCHC: 33.3 g/dL (ref 32.0–36.0)
MCV: 94.6 fL (ref 80.0–100.0)
MPV: 10.1 fL (ref 7.5–12.5)
Monocytes Relative: 6.3 %
Neutro Abs: 9361 cells/uL — ABNORMAL HIGH (ref 1500–7800)
Neutrophils Relative %: 85.1 %
Platelets: 296 10*3/uL (ref 140–400)
RBC: 3.9 10*6/uL (ref 3.80–5.10)
RDW: 12.8 % (ref 11.0–15.0)
Total Lymphocyte: 8.1 %
WBC: 11 10*3/uL — ABNORMAL HIGH (ref 3.8–10.8)

## 2022-01-13 LAB — COMPLETE METABOLIC PANEL WITH GFR
AG Ratio: 1.5 (calc) (ref 1.0–2.5)
ALT: 17 U/L (ref 6–29)
AST: 25 U/L (ref 10–30)
Albumin: 4.2 g/dL (ref 3.6–5.1)
Alkaline phosphatase (APISO): 74 U/L (ref 31–125)
BUN: 12 mg/dL (ref 7–25)
CO2: 26 mmol/L (ref 20–32)
Calcium: 8.9 mg/dL (ref 8.6–10.2)
Chloride: 101 mmol/L (ref 98–110)
Creat: 0.73 mg/dL (ref 0.50–0.99)
Globulin: 2.8 g/dL (calc) (ref 1.9–3.7)
Glucose, Bld: 88 mg/dL (ref 65–99)
Potassium: 4 mmol/L (ref 3.5–5.3)
Sodium: 136 mmol/L (ref 135–146)
Total Bilirubin: 0.5 mg/dL (ref 0.2–1.2)
Total Protein: 7 g/dL (ref 6.1–8.1)
eGFR: 106 mL/min/{1.73_m2} (ref >=60)

## 2022-01-13 MED ORDER — IOHEXOL 300 MG/ML  SOLN
100.0000 mL | Freq: Once | INTRAMUSCULAR | Status: AC | PRN
  Administered 2022-01-13: 100 mL via INTRAVENOUS

## 2022-01-13 MED ORDER — CEFTRIAXONE SODIUM 1 G IJ SOLR
1.0000 g | Freq: Once | INTRAMUSCULAR | Status: AC
  Administered 2022-01-13: 1 g via INTRAMUSCULAR

## 2022-01-13 MED ORDER — METRONIDAZOLE 500 MG PO TABS: 500.0000 mg | ORAL_TABLET | Freq: Three times a day (TID) | ORAL | 0 refills | Status: AC

## 2022-01-13 MED ORDER — CIPROFLOXACIN HCL 500 MG PO TABS: 500.0000 mg | ORAL_TABLET | Freq: Two times a day (BID) | ORAL | 0 refills | Status: AC

## 2022-01-13 NOTE — Progress Notes (Signed)
WBC count is only a little elevated so not nearly what it was 3 weeks ago.  That is very reassuring

## 2022-01-13 NOTE — Progress Notes (Signed)
Acute Office Visit  Subjective:     Patient ID: Lauren Franklin, female    DOB: October 26, 1980, 41 y.o.   MRN: [id number]  No chief complaint on file.   HPI Patient is in today for fever.  She was admitted to the hospital for perforated diverticulum with abscess on May 1.  We did a follow-up hospital visit on May 10 and she was taking her antibiotics consistently and feeling some better.  She did have her follow-up with GI on May 23 with a plan for colonoscopy next month.  She started having a little bit of intermittent left lower quadrant pain last week and developed a fever and upper respiratory symptoms.  Husband was also sick at that time.  Symptom resolved after just a few days and she felt better.  Then yesterday evening started feeling more tired and cold, started running a fever to 102.3 and having some intermittent left lower quadrant pain again.  Negative COVID test at home last night.  She just completed her antibiotics earlier this week.  ROS      Objective:    BP 106/72   Pulse 95   Temp 98.6 F (37 C) (Oral)   Resp 16   Ht 5\' 4"  (1.626 m)   Wt 118 lb (53.5 kg)   SpO2 95%   BMI 20.25 kg/m    Physical Exam Vitals and nursing note reviewed.  Constitutional:      Appearance: She is well-developed.  HENT:     Head: Normocephalic and atraumatic.  Eyes:     Conjunctiva/sclera: Conjunctivae normal.  Cardiovascular:     Rate and Rhythm: Normal rate.  Pulmonary:     Effort: Pulmonary effort is normal.  Abdominal:     General: There is distension.     Tenderness: There is no abdominal tenderness. There is no rebound.  Skin:    General: Skin is warm and dry.     Coloration: Skin is not pale.  Neurological:     Mental Status: She is alert and oriented to person, place, and time.  Psychiatric:        Behavior: Behavior normal.    Results for orders placed or performed in visit on 01/13/22  CBC with Differential/Platelet  Result Value Ref Range   WBC 11.0  (H) 3.8 - 10.8 Thousand/uL   RBC 3.90 3.80 - 5.10 Million/uL   Hemoglobin 12.3 11.7 - 15.5 g/dL   HCT 01/15/22 22.9 - 40.8 %   MCV 94.6 80.0 - 100.0 fL   MCH 31.5 27.0 - 33.0 pg   MCHC 33.3 32.0 - 36.0 g/dL   RDW 23.6 69.8 - 02.8 %   Platelets 296 140 - 400 Thousand/uL   MPV 10.1 7.5 - 12.5 fL   Neutro Abs 9,361 (H) 1,500 - 7,800 cells/uL   Lymphs Abs 891 850 - 3,900 cells/uL   Absolute Monocytes 693 200 - 950 cells/uL   Eosinophils Absolute 11 (L) 15 - 500 cells/uL   Basophils Absolute 44 0 - 200 cells/uL   Neutrophils Relative % 85.1 %   Total Lymphocyte 8.1 %   Monocytes Relative 6.3 %   Eosinophils Relative 0.1 %   Basophils Relative 0.4 %  COMPLETE METABOLIC PANEL WITH GFR  Result Value Ref Range   Glucose, Bld 88 65 - 99 mg/dL   BUN 12 7 - 25 mg/dL   Creat 72.1 0.13 - 6.57 mg/dL   eGFR 8.42 > OR = 60 927   BUN/Creatinine Ratio  NOT APPLICABLE 6 - 22 (calc)   Sodium 136 135 - 146 mmol/L   Potassium 4.0 3.5 - 5.3 mmol/L   Chloride 101 98 - 110 mmol/L   CO2 26 20 - 32 mmol/L   Calcium 8.9 8.6 - 10.2 mg/dL   Total Protein 7.0 6.1 - 8.1 g/dL   Albumin 4.2 3.6 - 5.1 g/dL   Globulin 2.8 1.9 - 3.7 g/dL (calc)   AG Ratio 1.5 1.0 - 2.5 (calc)   Total Bilirubin 0.5 0.2 - 1.2 mg/dL   Alkaline phosphatase (APISO) 74 31 - 125 U/L   AST 25 10 - 30 U/L   ALT 17 6 - 29 U/L        Assessment & Plan:   Problem List Items Addressed This Visit   None Visit Diagnoses     Left lower quadrant abdominal pain    -  Primary   Relevant Medications   cefTRIAXone (ROCEPHIN) injection 1 g (Completed)   Other Relevant Orders   CT Abdomen Pelvis W Contrast (Completed)   CBC with Differential/Platelet (Completed)   COMPLETE METABOLIC PANEL WITH GFR (Completed)   Fever, unspecified fever cause       Relevant Medications   cefTRIAXone (ROCEPHIN) injection 1 g (Completed)   Other Relevant Orders   CT Abdomen Pelvis W Contrast (Completed)   CBC with Differential/Platelet (Completed)    COMPLETE METABOLIC PANEL WITH GFR (Completed)   Diverticulitis       Relevant Medications   cefTRIAXone (ROCEPHIN) injection 1 g (Completed)   Other Relevant Orders   CT Abdomen Pelvis W Contrast (Completed)   CBC with Differential/Platelet (Completed)   COMPLETE METABOLIC PANEL WITH GFR (Completed)      She just completed her antibiotics earlier this week so I am now concerned that she is starting to have pain again and fever to 102.3.  We will get repeat updated CAT scan of abdomen.  CT showed thickening of part of the colon.  No sign of acute perforation.  WBC count came back around 11,000 so just mildly elevated.  We did go ahead and give her 1 g of Rocephin here in the office.  Documentation is under separate order.  But was given today.  Also will reach out to GI to see if they have any further recommendations consider putting her back on her oral antibiotics for now  Meds ordered this encounter  Medications   cefTRIAXone (ROCEPHIN) injection 1 g   ciprofloxacin (CIPRO) 500 MG tablet    Sig: Take 1 tablet (500 mg total) by mouth 2 (two) times daily for 5 days.    Dispense:  10 tablet    Refill:  0   metroNIDAZOLE (FLAGYL) 500 MG tablet    Sig: Take 1 tablet (500 mg total) by mouth 3 (three) times daily for 5 days.    Dispense:  15 tablet    Refill:  0    No follow-ups on file.  Beatrice Lecher, MD

## 2022-01-17 ENCOUNTER — Ambulatory Visit (INDEPENDENT_AMBULATORY_CARE_PROVIDER_SITE_OTHER): Payer: No Typology Code available for payment source | Admitting: Internal Medicine

## 2022-01-17 ENCOUNTER — Encounter: Payer: Self-pay | Admitting: Internal Medicine

## 2022-01-17 ENCOUNTER — Other Ambulatory Visit (HOSPITAL_BASED_OUTPATIENT_CLINIC_OR_DEPARTMENT_OTHER): Payer: Self-pay

## 2022-01-17 ENCOUNTER — Other Ambulatory Visit: Payer: Self-pay

## 2022-01-17 VITALS — BP 130/70 | HR 78 | Ht 64.0 in | Wt 122.0 lb

## 2022-01-17 DIAGNOSIS — K5732 Diverticulitis of large intestine without perforation or abscess without bleeding: Secondary | ICD-10-CM

## 2022-01-17 DIAGNOSIS — A09 Infectious gastroenteritis and colitis, unspecified: Secondary | ICD-10-CM

## 2022-01-17 DIAGNOSIS — K625 Hemorrhage of anus and rectum: Secondary | ICD-10-CM | POA: Diagnosis not present

## 2022-01-17 DIAGNOSIS — F988 Other specified behavioral and emotional disorders with onset usually occurring in childhood and adolescence: Secondary | ICD-10-CM

## 2022-01-17 MED ORDER — AMPHETAMINE-DEXTROAMPHETAMINE 20 MG PO TABS
20.0000 mg | ORAL_TABLET | Freq: Two times a day (BID) | ORAL | 0 refills | Status: DC
  Filled 2022-01-17: qty 180, 90d supply, fill #0

## 2022-01-17 NOTE — Progress Notes (Signed)
Chief Complaint: Perforated diverticulitis, recent colitis  HPI : 41 year old female with history of diverticulitis or ADHD presents with perforated diverticulitis and recent colitis seen on CT scan  She had ab pain LLQ last month and was found to have a perforated diverticulitis for which she was admitted 5/1 to 5/4.  She was treated with antibiotic therapy at that time and was monitored by surgery during the course of her hospitalization.  She has noted occasional bleeding when she wipes with toilet paper. She has recently completed a course of antibiotics for diverticulitis. Prior to the diagnosis of her diverticulitis, she would go to the bathroom to have a BM once or twice a week. Nowadays after she completed her course of antibiotics, she has on average one BM per day that are loose. She is not having any LLQ ab pain currently. Denies N&V, dysphagia, weight loss. Denies fam hx of colon cancer. Denies prior colonoscopy. Has gallbladder removed in the past.  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  Interval History: Patient was doing well during her last clinic visit with me, but then subsequently developed fevers, abdominal pain, and diarrhea last week when she completed her antibiotics course.  She saw her PCP who performed a CT scan as well as labs that showed diffuse colonic wall thickening from the cecum to the sigmoid colon and a mild leukocytosis, respectively.  Since then, her diarrhea and fevers have improved.  She is no longer having any fevers at all.  She is on average having 1 watery stool a day.  She has a little bit of lower abdominal discomfort.  Denies any rectal bleeding.  She has never restarted on antibiotics with this most recent episode.  Current Outpatient Medications  Medication Sig Dispense Refill   amphetamine-dextroamphetamine (ADDERALL) 20 MG tablet Take 1 tablet (20 mg total) by mouth 2 (two) times daily. 180 tablet 0    ciprofloxacin (CIPRO) 500 MG tablet Take 1 tablet (500 mg total) by mouth 2 (two) times daily for 5 days. 10 tablet 0   ibuprofen (ADVIL) 600 MG tablet Take 1 tablet (600 mg total) by mouth every 8 (eight) hours with food as needed for moderate pain. 15 tablet 0   levonorgestrel (MIRENA) 20 MCG/24HR IUD 1 each by Intrauterine route once.     metroNIDAZOLE (FLAGYL) 500 MG tablet Take 1 tablet (500 mg total) by mouth 3 (three) times daily for 5 days. 15 tablet 0   PEG-KCl-NaCl-NaSulf-Na Asc-C (PLENVU) 140 g SOLR Take 1 kit by mouth as directed 3 each 0   polyethylene glycol (MIRALAX / GLYCOLAX) 17 g packet Take 17 g by mouth daily as needed for mild constipation or moderate constipation. 14 each 0   No current facility-administered medications for this visit.   Review of Systems: All systems reviewed and negative except where noted in HPI.   Physical Exam: BP 130/70   Pulse 78   Ht 5\' 4"  (1.626 m)   Wt 122 lb (55.3 kg)   BMI 20.94 kg/m  Constitutional: Pleasant,well-developed, female in no acute distress. HEENT: Normocephalic and atraumatic. Conjunctivae are normal. No scleral icterus. Cardiovascular: Normal rate, regular rhythm.  Pulmonary/chest: Effort normal and breath sounds normal. No wheezing, rales or rhonchi. Abdominal: Soft, nondistended, nontender. Bowel sounds active throughout. There are no masses palpable. No hepatomegaly. Extremities: No edema Neurological: Alert and oriented to person place and time. Skin: Skin is warm and dry. No rashes noted. Psychiatric:  Normal mood and affect. Behavior is normal.  Labs 12/2021: CBC and BMP unremarkable  Pelvic U/S 12/19/21: IMPRESSION: 1. Intrauterine contraceptive device is not seen within the endometrial canal. 2. Trace free fluid. 3. Prominent veins/venous congestion in the left adnexa  CT A/P w/contrast 12/19/21: IMPRESSION: Acute diverticulitis/colitis of the mid to distal descending colon, with adjacent small collection  along the distal aspect measuring 1.1 x 1.0 x 1.7 cm consistent with a contained perforation. 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 Recommend gyn referral/consultation. IUD is in place within a retroverted uterus.  CT A/P w/contrast 01/13/22: IMPRESSION: 1. Diffuse colonic wall thickening extends from the cecum through the distal sigmoid colon compatible with a nonspecific colitis. 2. Minimal pericolonic inflammatory change remains in the proximal sigmoid, in the distal aspect of where previous inflammation was present. No progressive or other focal inflammatory change. 3. Small amount of free fluid dependently within the anatomic pelvis is likely reactive. No abscess. 4. 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  ASSESSMENT AND PLAN: Infectious colitis Colitis seen on CT History of perforated diverticulitis History of rectal bleeding Patient presents for follow-up of colitis that was seen on her recent imaging and recent clinical symptoms of fevers, diarrhea, and ab pain.  Her symptoms have now significantly improved without any additional antibiotic therapy.  I suspect at this time that the patient had an episode of infectious colitis that has now resolved on its own.  With the patient having only 1 stool per day currently, I think that C. difficile infection is highly unlikely.  Also I think that recurrent diverticulitis is also unlikely in the setting of the diffuse nature of the colonic wall thickening seen on CT scan.  Patient is not tender to palpation on exam today.  We will keep her current colonoscopy appointment as scheduled. - Colonoscopy is currently scheduled for 03/14/22 at [facility], [place]  I spent 30 minutes of time, including in depth chart review, independent review of results as outlined above, communicating results with the patient directly, face-to-face time with the patient, coordinating care, and ordering studies and  medications as appropriate, and documentation.

## 2022-01-17 NOTE — Patient Instructions (Signed)
If you are age 41 or younger, your body mass index should be between 19-25. Your Body mass index is 20.94 kg/m. If this is out of the aformentioned range listed, please consider follow up with your Primary Care Provider.   ________________________________________________________  The [place] [facility] providers would like to encourage you to use [facility] to communicate with providers for non-urgent requests or questions.  Due to long hold times on the telephone, sending your provider a message by [facility] may be a faster and more efficient way to get a response.  Please allow 48 business hours for a response.  Please remember that this is for non-urgent requests.  _______________________________________________________  Lauren Franklin your feeling better.   I appreciate the opportunity to care for you. Dayna Barker, MD

## 2022-01-20 ENCOUNTER — Ambulatory Visit: Payer: No Typology Code available for payment source | Admitting: Internal Medicine

## 2022-01-26 ENCOUNTER — Other Ambulatory Visit (HOSPITAL_COMMUNITY): Payer: Self-pay

## 2022-02-06 NOTE — Chronic Care Management (AMB) (Unsigned)
  Care Management   Outreach Note  02/06/2022 Name: Lovell Roe MRN: [id number] DOB: [birth year]  Referred by: Hali Marry, MD Reason for referral : Care Coordination (Initial outreach to schedule initial with [facility] )   A second unsuccessful telephone outreach was attempted today. The patient was referred to the case management team for assistance with care management and care coordination.   Follow Up Plan:  A HIPAA compliant phone message was left for the patient providing contact information and requesting a return call.   Julian Hy, [facility] Management  Direct Dial: [phone]

## 2022-02-16 ENCOUNTER — Ambulatory Visit (INDEPENDENT_AMBULATORY_CARE_PROVIDER_SITE_OTHER): Payer: No Typology Code available for payment source | Admitting: Family Medicine

## 2022-02-16 ENCOUNTER — Encounter: Payer: Self-pay | Admitting: Family Medicine

## 2022-02-16 VITALS — BP 107/70 | HR 63 | Ht 64.0 in | Wt 128.0 lb

## 2022-02-16 DIAGNOSIS — Z0000 Encounter for general adult medical examination without abnormal findings: Secondary | ICD-10-CM | POA: Diagnosis not present

## 2022-02-16 NOTE — Progress Notes (Signed)
Complete physical exam  Patient: Lauren Franklin   DOB: 1981-01-06   41 y.o. Female  MRN: [id number]  Subjective:    Chief Complaint  Patient presents with   Annual Exam    Venora Kautzman is a 41 y.o. female who presents today for a complete physical exam. She reports consuming a general diet.  Walking 3 days per week  She generally feels well. She reports sleeping well. She does not have additional problems to discuss today.    Most recent fall risk assessment:    02/14/2021    8:44 AM  Fall Risk   Falls in the past year? 0  Number falls in past yr: 0  Injury with Fall? 0  Risk for fall due to : No Fall Risks  Follow up Falls prevention discussed;Falls evaluation completed     Most recent depression screenings:    08/17/2021    9:44 AM 02/14/2021    8:44 AM  PHQ 2/9 Scores  PHQ - 2 Score 0 0      Past Medical History:  Diagnosis Date   ADD (attention deficit disorder)    ADHD (attention deficit hyperactivity disorder)    Cyst, ovary, dermoid    Diverticulitis    Hypocalcemia    IUD    mirena   Low hemoglobin    Vaginal Pap smear, abnormal    Viral gastroenteritis 07/12/2015   Past Surgical History:  Procedure Laterality Date   CHOLECYSTECTOMY  2000   Family History  Problem Relation Age of Onset   Cancer Maternal Grandmother        ovarian or cervical   Diabetes Other    Colon cancer Neg Hx    Esophageal cancer Neg Hx       Patient Care Team: Agapito Games, MD as PCP - General   Outpatient Medications Prior to Visit  Medication Sig   amphetamine-dextroamphetamine (ADDERALL) 20 MG tablet Take 1 tablet (20 mg total) by mouth 2 (two) times daily.   levonorgestrel (MIRENA) 20 MCG/24HR IUD 1 each by Intrauterine route once.   [DISCONTINUED] ibuprofen (ADVIL) 600 MG tablet Take 1 tablet (600 mg total) by mouth every 8 (eight) hours with food as needed for moderate pain.   [DISCONTINUED] PEG-KCl-NaCl-NaSulf-Na Asc-C (PLENVU) 140 g  SOLR Take 1 kit by mouth as directed   [DISCONTINUED] polyethylene glycol (MIRALAX / GLYCOLAX) 17 g packet Take 17 g by mouth daily as needed for mild constipation or moderate constipation.   No facility-administered medications prior to visit.    ROS        Objective:     BP 107/70   Pulse 63   Ht 5\' 4"  (1.626 m)   Wt 128 lb (58.1 kg)   SpO2 100%   BMI 21.97 kg/m     Physical Exam Constitutional:      Appearance: Normal appearance. She is well-developed.  HENT:     Head: Normocephalic and atraumatic.     Right Ear: Tympanic membrane, ear canal and external ear normal.     Left Ear: Tympanic membrane, ear canal and external ear normal.     Nose: Nose normal.     Mouth/Throat:     Mouth: Mucous membranes are moist.     Pharynx: Oropharynx is clear. No posterior oropharyngeal erythema.  Eyes:     Conjunctiva/sclera: Conjunctivae normal.     Pupils: Pupils are equal, round, and reactive to light.  Neck:     Thyroid: No thyromegaly.  Cardiovascular:     Rate and Rhythm: Normal rate and regular rhythm.     Heart sounds: Normal heart sounds.  Pulmonary:     Effort: Pulmonary effort is normal.     Breath sounds: Normal breath sounds. No wheezing.  Abdominal:     General: Bowel sounds are normal.     Palpations: Abdomen is soft.     Tenderness: There is no abdominal tenderness.  Musculoskeletal:     Cervical back: Neck supple.  Lymphadenopathy:     Cervical: No cervical adenopathy.  Skin:    General: Skin is warm and dry.  Neurological:     Mental Status: She is alert and oriented to person, place, and time.  Psychiatric:        Mood and Affect: Mood normal.      No results found for any visits on 02/16/22.      Assessment & Plan:    Routine Health Maintenance and Physical Exam  Immunization History  Administered Date(s) Administered   HPV 9-valent 03/20/2019, 06/02/2019, 10/03/2019   Influenza, Quadrivalent, Recombinant, Inj, Pf 05/21/2020    Influenza,inj,Quad PF,6+ Mos 06/13/2013, 06/11/2015, 06/08/2016   Influenza-Unspecified 05/28/2017, 06/11/2018, 06/13/2019, 06/15/2021   Tdap 07/16/2013    Health Maintenance  Topic Date Due   PAP SMEAR-Modifier  03/19/2022   COVID-19 Vaccine (1) 09/03/2023 (Originally 07/03/1981)   Hepatitis C Screening  08/17/2024 (Originally 01/01/1999)   INFLUENZA VACCINE  03/21/2022   TETANUS/TDAP  07/17/2023   HPV VACCINES  Completed   HIV Screening  Completed   Pneumococcal Vaccine 32-13 Years old  Aged Out    Discussed health benefits of physical activity, and encouraged her to engage in regular exercise appropriate for her age and condition.  Problem List Items Addressed This Visit   None Visit Diagnoses     Wellness examination    -  Primary      Keep up a regular exercise program and make sure you are eating a healthy diet Try to eat 4 servings of dairy a day, or if you are lactose intolerant take a calcium with vitamin D daily.  Your vaccines are up to date.    No follow-ups on file.     Beatrice Lecher, MD

## 2022-03-07 ENCOUNTER — Encounter: Payer: Self-pay | Admitting: Internal Medicine

## 2022-03-14 ENCOUNTER — Ambulatory Visit (AMBULATORY_SURGERY_CENTER): Payer: No Typology Code available for payment source | Admitting: Internal Medicine

## 2022-03-14 ENCOUNTER — Encounter: Payer: Self-pay | Admitting: Internal Medicine

## 2022-03-14 VITALS — BP 96/53 | HR 63 | Temp 98.5°F | Resp 10 | Ht 64.0 in | Wt 122.0 lb

## 2022-03-14 DIAGNOSIS — K5732 Diverticulitis of large intestine without perforation or abscess without bleeding: Secondary | ICD-10-CM | POA: Diagnosis not present

## 2022-03-14 DIAGNOSIS — K529 Noninfective gastroenteritis and colitis, unspecified: Secondary | ICD-10-CM | POA: Diagnosis not present

## 2022-03-14 DIAGNOSIS — A09 Infectious gastroenteritis and colitis, unspecified: Secondary | ICD-10-CM

## 2022-03-14 DIAGNOSIS — K625 Hemorrhage of anus and rectum: Secondary | ICD-10-CM

## 2022-03-14 DIAGNOSIS — K51511 Left sided colitis with rectal bleeding: Secondary | ICD-10-CM | POA: Diagnosis not present

## 2022-03-14 MED ORDER — SODIUM CHLORIDE 0.9 % IV SOLN: 500.0000 mL | Freq: Once | INTRAVENOUS | Status: DC

## 2022-03-14 NOTE — Progress Notes (Signed)
To pacu, VSS. Report to [email or web address].tb 

## 2022-03-14 NOTE — Op Note (Signed)
[facility] Patient Name: Virgen Franklin Procedure Date: 03/14/2022 8:18 AM MRN: [id number] Endoscopist: Sonny Masters "Lauren Franklin ,  Age: 41 Referring MD:  Date of Birth: 1980-11-15 Gender: Female Account #: [id number] Procedure:                Colonoscopy Indications:              Abnormal CT of the GI tract, Follow-up of                            diverticulitis Medicines:                Monitored Anesthesia Care Procedure:                Pre-Anesthesia Assessment:                           - Prior to the procedure, a History and Physical                            was performed, and patient medications and                            allergies were reviewed. The patient's tolerance of                            previous anesthesia was also reviewed. The risks                            and benefits of the procedure and the sedation                            options and risks were discussed with the patient.                            All questions were answered, and informed consent                            was obtained. Prior Anticoagulants: The patient has                            taken no previous anticoagulant or antiplatelet                            agents. ASA Grade Assessment: II - A patient with                            mild systemic disease. After reviewing the risks                            and benefits, the patient was deemed in                            satisfactory condition to undergo the procedure.  After obtaining informed consent, the colonoscope                            was passed under direct vision. Throughout the                            procedure, the patient's blood pressure, pulse, and                            oxygen saturations were monitored continuously. The                            Colonoscope was introduced through the anus and                            advanced to the the terminal ileum. The  colonoscopy                            was performed without difficulty. The patient                            tolerated the procedure well. The quality of the                            bowel preparation was good. The terminal ileum,                            ileocecal valve, appendiceal orifice, and rectum                            were photographed. Scope In: 8:33:10 AM Scope Out: 8:57:31 AM Scope Withdrawal Time: 0 hours 17 minutes 33 seconds  Total Procedure Duration: 0 hours 24 minutes 21 seconds  Findings:                 The terminal ileum appeared normal.                           Multiple small and large-mouthed diverticula were                            found in the sigmoid colon.                           A localized area of mildly erythematous mucosa was                            found in the rectum, in the sigmoid colon and in                            the descending colon. This was biopsied with a cold                            forceps for histology.  Non-bleeding internal hemorrhoids were found during                            retroflexion. Complications:            No immediate complications. Estimated Blood Loss:     Estimated blood loss was minimal. Impression:               - The examined portion of the ileum was normal.                           - Diverticulosis in the sigmoid colon.                           - Erythematous mucosa in the rectum, in the sigmoid                            colon and in the descending colon. Biopsied.                           - Non-bleeding internal hemorrhoids. Recommendation:           - Discharge patient to home (with escort).                           - Await pathology results.                           - The findings and recommendations were discussed                            with the patient. Sonny Masters "Lauren Franklin,  03/14/2022 9:04:17 AM

## 2022-03-14 NOTE — Patient Instructions (Signed)
Thank you for coming in to see Lauren Franklin today! Resume your diet and medications today. Return to normal daily activities tomorrow. Biopsy results will be available in 1-2 weeks.  Recommendations will be made at that time if necessary  [place]:   Refer to the procedure report that was given to you for any specific questions about what was found during the examination.  If the procedure report does not answer your questions, please call your gastroenterologist to clarify.  If you requested that your care partner not be given the details of your procedure findings, then the procedure report has been included in a sealed envelope for you to review at your convenience later.  YOU SHOULD EXPECT: Some feelings of bloating in the abdomen. Passage of more gas than usual.  Walking can help get rid of the air that was put into your GI tract during the procedure and reduce the bloating. If you had a lower endoscopy (such as a colonoscopy or flexible sigmoidoscopy) you may notice spotting of blood in your stool or on the toilet paper. If you underwent a bowel prep for your procedure, you may not have a normal bowel movement for a few days.  Please Note:  You might notice some irritation and congestion in your nose or some drainage.  This is from the oxygen used during your procedure.  There is no need for concern and it should clear up in a day or so.  SYMPTOMS TO REPORT IMMEDIATELY:  Following lower endoscopy (colonoscopy or flexible sigmoidoscopy):  Excessive amounts of blood in the stool  Significant tenderness or worsening of abdominal pains  Swelling of the abdomen that is new, acute  Fever of 100F or higher   For urgent or emergent issues, a gastroenterologist can be reached at any hour by calling [phone]. Do not use MyChart messaging for urgent concerns.    DIET:  We do recommend a small meal at first, but then you may proceed to your  regular diet.  Drink plenty of fluids but you should avoid alcoholic beverages for 24 hours.  ACTIVITY:  You should plan to take it easy for the rest of today and you should NOT DRIVE or use heavy machinery until tomorrow (because of the sedation medicines used during the test).    FOLLOW UP: Our staff will call the number listed on your records the next business day following your procedure.  We will call around 7:15- 8:00 am to check on you and address any questions or concerns that you may have regarding the information given to you following your procedure. If we do not reach you, we will leave a message.  If you develop any symptoms (ie: fever, flu-like symptoms, shortness of breath, cough etc.) before then, please call [phone].  If you test positive for Covid 19 in the 2 weeks post procedure, please call and report this information to Lauren Franklin.    If any biopsies were taken you will be contacted by phone or by letter within the next 1-3 weeks.  Please call Lauren Franklin at [phone] if you have not heard about the biopsies in 3 weeks.    SIGNATURES/CONFIDENTIALITY: You and/or your care partner have signed paperwork which will be entered into your electronic medical record.  These signatures attest to the fact that that the information above on your After Visit Summary has been reviewed and is understood.  Full responsibility of the confidentiality of this discharge  information lies with you and/or your care-partner.

## 2022-03-14 NOTE — Progress Notes (Signed)
Pt's states no medical or surgical changes since previsit or office visit. 

## 2022-03-14 NOTE — Progress Notes (Signed)
GASTROENTEROLOGY PROCEDURE H&P NOTE   Primary Care Physician: Hali Marry, MD    Reason for Procedure:   History of diverticulitis, colitis on CT, rectal bleeding  Plan:    Colonoscopy  Patient is appropriate for endoscopic procedure(s) in the ambulatory ([facility]) setting.  The nature of the procedure, as well as the risks, benefits, and alternatives were carefully and thoroughly reviewed with the patient. Ample time for discussion and questions allowed. The patient understood, was satisfied, and agreed to proceed.     HPI: Lauren Franklin is a 41 y.o. female who presents for colonoscopy for evaluation of history of diverticulitis colitis on CT, rectal bleeding .  Patient was most recently seen in the Gastroenterology Clinic on 01/17/22.  No interval change in medical history since that appointment. Please refer to that note for full details regarding GI history and clinical presentation.   Past Medical History:  Diagnosis Date   ADD (attention deficit disorder)    ADHD (attention deficit hyperactivity disorder)    Allergy    Cyst, ovary, dermoid    Diverticulitis    Hypocalcemia    IUD    mirena   Low hemoglobin    Vaginal Pap smear, abnormal    Viral gastroenteritis 07/12/2015    Past Surgical History:  Procedure Laterality Date   CHOLECYSTECTOMY  2000    Prior to Admission medications   Medication Sig Start Date End Date Taking? Authorizing Provider  amphetamine-dextroamphetamine (ADDERALL) 20 MG tablet Take 1 tablet (20 mg total) by mouth 2 (two) times daily. 01/17/22 07/16/22 Yes Hali Marry, MD  levonorgestrel (MIRENA) 20 MCG/24HR IUD 1 each by Intrauterine route once.   Yes [provider]    Current Outpatient Medications  Medication Sig Dispense Refill   amphetamine-dextroamphetamine (ADDERALL) 20 MG tablet Take 1 tablet (20 mg total) by mouth 2 (two) times daily. 180 tablet 0   levonorgestrel (MIRENA) 20 MCG/24HR IUD 1 each  by Intrauterine route once.     Current Facility-Administered Medications  Medication Dose Route Frequency Provider Last Rate Last Admin   0.9 %  sodium chloride infusion  500 mL Intravenous Once Sharyn Creamer, MD        Allergies as of 03/14/2022 - Review Complete 03/14/2022  Allergen Reaction Noted   Penicillins Itching 01/01/2017    Family History  Problem Relation Age of Onset   Cancer Maternal Grandmother        ovarian or cervical   Diabetes Other    Colon cancer Neg Hx    Esophageal cancer Neg Hx    Rectal cancer Neg Hx     Social History   Socioeconomic History   Marital status: Married    Spouse name: Louie Casa   Number of children: 4   Years of education: Not on file   Highest education level: Not on file  Occupational History   Occupation: Dispensing optician: [employer]  Tobacco Use   Smoking status: Former    Types: Cigarettes    Quit date: 09/26/2019    Years since quitting: 2.4   Smokeless tobacco: Never  Vaping Use   Vaping Use: Former  Substance and Sexual Activity   Alcohol use: Yes    Comment: social   Drug use: No   Sexual activity: Yes    Partners: Male    Birth control/protection: I.U.D.  Other Topics Concern   Not on file  Social History Narrative   No regular execise.  Social Determinants of Health   Financial Resource Strain: Not on file  Food Insecurity: No Food Insecurity (04/25/2018)   Hunger Vital Sign    Worried About Running Out of Food in the Last Year: Never true    Ran Out of Food in the Last Year: Never true  Transportation Needs: No Transportation Needs (04/25/2018)   PRAPARE - Hydrologist (Medical): No    Lack of Transportation (Non-Medical): No  Physical Activity: Insufficiently Active (04/25/2018)   Exercise Vital Sign    Days of Exercise per Week: 1 day    Minutes of Exercise per Session: 10 min  Stress: No Stress Concern Present (04/25/2018)   [place]    Feeling of Stress : Only a little  Social Connections: Somewhat Isolated (04/25/2018)   Social Connection and Isolation Panel [NHANES]    Frequency of Communication with Friends and Family: Once a week    Frequency of Social Gatherings with Friends and Family: Once a week    Attends Religious Services: More than 4 times per year    Active Member of [employer] or Organizations: No    Attends Archivist Meetings: Not asked    Marital Status: Married  Human resources officer Violence: Not At Risk (04/25/2018)   Humiliation, Afraid, Rape, and Kick questionnaire    Fear of Current or Ex-Partner: No    Emotionally Abused: No    Physically Abused: No    Sexually Abused: No    Physical Exam: Vital signs in last 24 hours: BP (!) 109/49   Pulse 64   Temp 98.5 F (36.9 C)   Ht '5\' 4"'$  (1.626 m)   Wt 122 lb (55.3 kg)   SpO2 100%   BMI 20.94 kg/m  GEN: NAD EYE: Sclerae anicteric ENT: MMM CV: Non-tachycardic Pulm: No increased WOB GI: Soft NEURO:  Alert & Oriented   Christia Reading, MD [facility]   03/14/2022 8:14 AM

## 2022-03-15 ENCOUNTER — Telehealth: Payer: Self-pay

## 2022-03-15 NOTE — Telephone Encounter (Signed)
Left message

## 2022-03-16 ENCOUNTER — Ambulatory Visit (INDEPENDENT_AMBULATORY_CARE_PROVIDER_SITE_OTHER): Payer: No Typology Code available for payment source

## 2022-03-16 DIAGNOSIS — D270 Benign neoplasm of right ovary: Secondary | ICD-10-CM | POA: Diagnosis not present

## 2022-03-17 ENCOUNTER — Encounter: Payer: Self-pay | Admitting: Internal Medicine

## 2022-03-21 ENCOUNTER — Telehealth: Payer: Self-pay | Admitting: *Deleted

## 2022-03-21 NOTE — Telephone Encounter (Signed)
Left patient a message to call and schedule Pre Op appointment with Dr. Damita Dunnings for 04/27/2022.

## 2022-04-27 ENCOUNTER — Ambulatory Visit (INDEPENDENT_AMBULATORY_CARE_PROVIDER_SITE_OTHER): Payer: No Typology Code available for payment source | Admitting: Obstetrics and Gynecology

## 2022-04-27 ENCOUNTER — Encounter: Payer: Self-pay | Admitting: Obstetrics and Gynecology

## 2022-04-27 VITALS — BP 131/86 | HR 99 | Resp 16 | Ht 64.0 in | Wt 127.0 lb

## 2022-04-27 DIAGNOSIS — D270 Benign neoplasm of right ovary: Secondary | ICD-10-CM

## 2022-04-27 NOTE — Progress Notes (Signed)
   GYNECOLOGY OFFICE VISIT NOTE  History:   Lauren Franklin is a 41 y.o. [phone] here today for preop visit for her dermoid on the right ovary discovered incidentally.  She is recovered now from the bowel perforation and ready for surgery. She feels like November would be best.     Past Medical History:  Diagnosis Date   ADD (attention deficit disorder)    ADHD (attention deficit hyperactivity disorder)    Allergy    Cyst, ovary, dermoid    Diverticulitis    Hypocalcemia    IUD    mirena   Low hemoglobin    Vaginal Pap smear, abnormal    Viral gastroenteritis 07/12/2015    Past Surgical History:  Procedure Laterality Date   CHOLECYSTECTOMY  2000    The following portions of the patient's history were reviewed and updated as appropriate: allergies, current medications, past family history, past medical history, past social history, past surgical history and problem list.   Health Maintenance:   Diagnosis  Date Value Ref Range Status  03/20/2019   Final   NEGATIVE FOR INTRAEPITHELIAL LESIONS OR MALIGNANCY. BENIGN REACTIVE/REPARATIVE CHANGES.     Review of Systems:  Pertinent items noted in HPI and remainder of comprehensive ROS otherwise negative.  Physical Exam:  BP 131/86   Pulse 99   Resp 16   Ht '5\' 4"'$  (1.626 m)   Wt 127 lb (57.6 kg)   BMI 21.80 kg/m  CONSTITUTIONAL: Well-developed, well-nourished female in no acute distress.  HEENT:  Normocephalic, atraumatic. External right and left ear normal. No scleral icterus.  NECK: Normal range of motion, supple, no masses noted on observation SKIN: No rash noted. Not diaphoretic. No erythema. No pallor. MUSCULOSKELETAL: Normal range of motion. No edema noted. NEUROLOGIC: Alert and oriented to person, place, and time. Normal muscle tone coordination. No cranial nerve deficit noted. PSYCHIATRIC: Normal mood and affect. Normal behavior. Normal judgment and thought content.  CARDIOVASCULAR: Normal heart rate  noted RESPIRATORY: Effort and breath sounds normal, no problems with respiration noted ABDOMEN: No masses noted. No other overt distention noted.    PELVIC: Deferred  Labs and Imaging No results found for this or any previous visit (from the past 168 hour(s)). No results found.  Assessment and Plan:   1. 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 We discussed the pros/cons of removal of ovary versus cystectomy. We reviewed risks of each surgery and she would like removal of the ovary.   - Risks of surgery include but are not limited to: bleeding, infection, injury to surrounding organs/tissues (i.e. bowel/bladder/ureters), need for additional procedures, wound complications, hospital re-admission, and conversion to open surgery, VTE - We discussed postop restrictions, precautions and expectations - Preop testing needed: None - All questions answered  Routine preventative health maintenance measures emphasized. Please refer to After Visit Summary for other counseling recommendations.   No follow-ups on file.  Radene Gunning, MD, [place] [facility], [place]

## 2022-05-01 ENCOUNTER — Ambulatory Visit: Payer: No Typology Code available for payment source | Admitting: Family Medicine

## 2022-05-18 ENCOUNTER — Other Ambulatory Visit (HOSPITAL_BASED_OUTPATIENT_CLINIC_OR_DEPARTMENT_OTHER): Payer: Self-pay

## 2022-05-18 ENCOUNTER — Other Ambulatory Visit: Payer: Self-pay

## 2022-05-18 DIAGNOSIS — F988 Other specified behavioral and emotional disorders with onset usually occurring in childhood and adolescence: Secondary | ICD-10-CM

## 2022-05-18 MED ORDER — AMPHETAMINE-DEXTROAMPHETAMINE 20 MG PO TABS
20.0000 mg | ORAL_TABLET | Freq: Two times a day (BID) | ORAL | 0 refills | Status: DC
  Filled 2022-05-18 – 2022-06-12 (×2): qty 180, 90d supply, fill #0

## 2022-06-01 ENCOUNTER — Other Ambulatory Visit (HOSPITAL_BASED_OUTPATIENT_CLINIC_OR_DEPARTMENT_OTHER): Payer: Self-pay

## 2022-06-12 ENCOUNTER — Other Ambulatory Visit (HOSPITAL_BASED_OUTPATIENT_CLINIC_OR_DEPARTMENT_OTHER): Payer: Self-pay

## 2022-06-26 ENCOUNTER — Encounter (HOSPITAL_COMMUNITY): Payer: Self-pay

## 2022-06-26 ENCOUNTER — Other Ambulatory Visit: Payer: Self-pay

## 2022-06-26 ENCOUNTER — Encounter (HOSPITAL_COMMUNITY)
Admission: RE | Admit: 2022-06-26 | Discharge: 2022-06-26 | Disposition: A | Discharge status: Home / Self Care | Payer: No Typology Code available for payment source | Source: Ambulatory Visit | Attending: Obstetrics and Gynecology | Admitting: Obstetrics and Gynecology

## 2022-06-26 VITALS — BP 128/83 | HR 116 | Temp 98.1°F | Resp 17 | Ht 64.0 in | Wt 125.0 lb

## 2022-06-26 DIAGNOSIS — Z01818 Encounter for other preprocedural examination: Secondary | ICD-10-CM

## 2022-06-26 DIAGNOSIS — Z01812 Encounter for preprocedural laboratory examination: Secondary | ICD-10-CM | POA: Insufficient documentation

## 2022-06-26 DIAGNOSIS — D270 Benign neoplasm of right ovary: Secondary | ICD-10-CM | POA: Diagnosis not present

## 2022-06-26 LAB — TYPE AND SCREEN
ABO/RH(D): A POS
Antibody Screen: NEGATIVE

## 2022-06-26 LAB — CBC
HCT: 37.1 % (ref 36.0–46.0)
Hemoglobin: 12.1 g/dL (ref 12.0–15.0)
MCH: 30.9 pg (ref 26.0–34.0)
MCHC: 32.6 g/dL (ref 30.0–36.0)
MCV: 94.6 fL (ref 80.0–100.0)
Platelets: 303 10*3/uL (ref 150–400)
RBC: 3.92 MIL/uL (ref 3.87–5.11)
RDW: 13.3 % (ref 11.5–15.5)
WBC: 7.4 10*3/uL (ref 4.0–10.5)
nRBC: 0 % (ref 0.0–0.2)

## 2022-06-26 NOTE — Progress Notes (Addendum)
Surgical Instructions   Your procedure is scheduled on Tuesday, 07/04/22 at 7:30 AM.   Report to [facility] Main Entrance "A" at 5:30 AM, then check in with the Admitting office.  Call this number if you have problems the morning of surgery:  [phone]   If you have any questions prior to your surgery date call [phone]: Open Monday-Friday 8am-4pm If you experience any cold or flu symptoms such as cough, fever, chills, shortness of breath, etc. between now and your scheduled surgery, please notify us at the above number    Remember:  Do not eat after midnight the night before your surgery.  You may drink clear liquids until 4:30 AM the morning of your surgery.   Clear liquids allowed are: Water, Non-Citrus Juices (without pulp), Carbonated Beverages, Clear Tea, Black Coffee ONLY (NO MILK, CREAM, HONEY OR POWDERED CREAMER of any kind), and Gatorade   Please complete your PRE-SURGERY ENSURE that was provided to you by ... the morning of surgery.  Please, if able, drink it in one setting. DO NOT SIP.    Take these medicines the morning of surgery with A SIP OF WATER:  None  As of today, STOP taking any Aspirin (unless otherwise instructed by your surgeon) Aleve, Naproxen, Ibuprofen, Motrin, Advil, Goody's, BC's, all herbal medications, fish oil, and all vitamins.     Do not wear jewelry or makeup. Do not wear lotions, powder, perfume or deodorant. Do not shave 48 hours prior to surgery. Do not bring valuables to the hospital. Do not wear nail polish, gel polish, artificial nails, or any other type of covering on natural nails (fingers and toes) If you have artificial nails or gel coating that need to be removed by a nail salon, please have this removed prior to surgery. Artificial nails or gel coating may interfere with anesthesia's ability to adequately monitor your vital signs.  [facility] is not responsible for any belongings or valuables.    Do NOT Smoke (Tobacco/Vaping)  24  hours prior to your procedure  Contacts, glasses, hearing aids, dentures or partials may not be worn into surgery, please bring cases for these belongings   Patients discharged the day of surgery will not be allowed to drive home, and someone needs to stay with them for 24 hours.  SURGICAL WAITING ROOM VISITATION Patients having surgery or a procedure may have no more than 2 support people in the waiting area - these visitors may rotate.   Children under the age of 8 must have an adult with them who is not the patient. If the patient needs to stay at the hospital during part of their recovery, the visitor guidelines for inpatient rooms apply. Pre-op nurse will coordinate an appropriate time for 1 support person to accompany patient in pre-op.  This support person may not rotate.   Please refer to [email or web address] for the visitor guidelines for Inpatients (after your surgery is over and you are in a regular room).   Special instructions:    Oral Hygiene is also important to reduce your risk of infection.  Remember - BRUSH YOUR TEETH THE MORNING OF SURGERY WITH YOUR REGULAR TOOTHPASTE  [place]- Preparing For Surgery  Before surgery, you can play an important role. Because skin is not sterile, your skin needs to be as free of germs as possible. You can reduce the number of germs on your skin by washing with CHG (chlorahexidine gluconate) Soap before surgery.  CHG is an antiseptic cleaner which kills germs  and bonds with the skin to continue killing germs even after washing.    Please do not use if you have an allergy to CHG or antibacterial soaps. If your skin becomes reddened/irritated stop using the CHG.  Do not shave (including legs and underarms) for at least 48 hours prior to first CHG shower. It is OK to shave your face.  Please follow these instructions carefully.    Shower the NIGHT BEFORE SURGERY and the MORNING OF SURGERY  with CHG Soap.   If you chose to wash your hair, wash your hair first as usual with your normal shampoo. After you shampoo, rinse your hair and body thoroughly to remove the shampoo.  Then [employer] and genitals (private parts) with your normal soap and rinse thoroughly to remove soap.  After that Use CHG Soap as you would any other liquid soap. You can apply CHG directly to the skin and wash gently with a scrungie or a clean washcloth.   Apply the CHG Soap to your body ONLY FROM THE NECK DOWN.  Do not use on open wounds or open sores. Avoid contact with your eyes, ears, mouth and genitals (private parts). Wash Face and genitals (private parts)  with your normal soap.   Wash thoroughly, paying special attention to the area where your surgery will be performed.  Thoroughly rinse your body with warm water from the neck down.  DO NOT shower/wash with your normal soap after using and rinsing off the CHG Soap.  Pat yourself dry with a CLEAN TOWEL.  Wear CLEAN PAJAMAS to bed the night before surgery  Place CLEAN SHEETS on your bed the night before your surgery  DO NOT SLEEP WITH PETS.  Day of Surgery: Take a shower with CHG soap. Wear Clean/Comfortable clothing the morning of surgery Do not apply any deodorant/lotion.   Remember to brush your teeth WITH YOUR REGULAR TOOTHPASTE.   Please read over the fact sheets that you were given.

## 2022-06-26 NOTE — Progress Notes (Signed)
PCP - Dr. Beatrice Lecher :  ERAS Protcol - yes PRE-SURGERY Ensure   COVID TEST- No   Anesthesia review: no  Patient denies shortness of breath, fever, cough and chest pain at PAT appointment   All instructions explained to the patient, with a verbal understanding of the material. Patient agrees to go over the instructions while at home for a better understanding. Patient also instructed to self quarantine after being tested for COVID-19. The opportunity to ask questions was provided.

## 2022-07-03 ENCOUNTER — Encounter: Payer: Self-pay | Admitting: Obstetrics and Gynecology

## 2022-07-04 ENCOUNTER — Other Ambulatory Visit (HOSPITAL_BASED_OUTPATIENT_CLINIC_OR_DEPARTMENT_OTHER): Payer: Self-pay

## 2022-07-04 ENCOUNTER — Other Ambulatory Visit: Payer: Self-pay

## 2022-07-04 ENCOUNTER — Ambulatory Visit (HOSPITAL_COMMUNITY)
Admission: RE | Admit: 2022-07-04 | Discharge: 2022-07-04 | Disposition: A | Discharge status: Home / Self Care | Payer: No Typology Code available for payment source | Attending: Obstetrics and Gynecology | Admitting: Obstetrics and Gynecology

## 2022-07-04 ENCOUNTER — Ambulatory Visit (HOSPITAL_BASED_OUTPATIENT_CLINIC_OR_DEPARTMENT_OTHER): Payer: No Typology Code available for payment source | Admitting: General Practice

## 2022-07-04 ENCOUNTER — Encounter (HOSPITAL_COMMUNITY)
Admission: RE | Disposition: A | Discharge status: Home / Self Care | Payer: Self-pay | Source: Home / Self Care | Attending: Obstetrics and Gynecology

## 2022-07-04 ENCOUNTER — Ambulatory Visit (HOSPITAL_COMMUNITY): Payer: No Typology Code available for payment source | Admitting: General Practice

## 2022-07-04 ENCOUNTER — Encounter (HOSPITAL_COMMUNITY): Payer: Self-pay | Admitting: Obstetrics and Gynecology

## 2022-07-04 DIAGNOSIS — D270 Benign neoplasm of right ovary: Secondary | ICD-10-CM | POA: Diagnosis present

## 2022-07-04 DIAGNOSIS — Z302 Encounter for sterilization: Secondary | ICD-10-CM | POA: Insufficient documentation

## 2022-07-04 DIAGNOSIS — Z87891 Personal history of nicotine dependence: Secondary | ICD-10-CM | POA: Insufficient documentation

## 2022-07-04 HISTORY — PX: LAPAROSCOPIC BILATERAL SALPINGO OOPHERECTOMY: SHX5890

## 2022-07-04 LAB — POCT PREGNANCY, URINE: Preg Test, Ur: NEGATIVE

## 2022-07-04 SURGERY — SALPINGO-OOPHORECTOMY, BILATERAL, LAPAROSCOPIC
Anesthesia: General | Laterality: Bilateral

## 2022-07-04 MED ORDER — FENTANYL CITRATE (PF) 250 MCG/5ML IJ SOLN
INTRAMUSCULAR | Status: DC | PRN
  Administered 2022-07-04: 100 ug via INTRAVENOUS

## 2022-07-04 MED ORDER — DEXAMETHASONE SODIUM PHOSPHATE 10 MG/ML IJ SOLN
INTRAMUSCULAR | Status: DC | PRN
  Administered 2022-07-04: 10 mg via INTRAVENOUS

## 2022-07-04 MED ORDER — ACETAMINOPHEN 500 MG PO TABS
ORAL_TABLET | ORAL | Status: AC
  Administered 2022-07-04: 1000 mg via ORAL
  Filled 2022-07-04: qty 2

## 2022-07-04 MED ORDER — BUPIVACAINE HCL (PF) 0.25 % IJ SOLN
INTRAMUSCULAR | Status: AC
  Filled 2022-07-04: qty 30

## 2022-07-04 MED ORDER — CHLORHEXIDINE GLUCONATE 0.12 % MT SOLN
OROMUCOSAL | Status: AC
  Administered 2022-07-04: 15 mL via OROMUCOSAL
  Filled 2022-07-04: qty 15

## 2022-07-04 MED ORDER — ACETAMINOPHEN 325 MG PO TABS: 325.0000 mg | ORAL_TABLET | ORAL | Status: DC | PRN

## 2022-07-04 MED ORDER — ROCURONIUM BROMIDE 10 MG/ML (PF) SYRINGE
PREFILLED_SYRINGE | INTRAVENOUS | Status: AC
  Filled 2022-07-04: qty 10

## 2022-07-04 MED ORDER — PROPOFOL 10 MG/ML IV BOLUS
INTRAVENOUS | Status: AC
  Filled 2022-07-04: qty 20

## 2022-07-04 MED ORDER — MIDAZOLAM HCL 2 MG/2ML IJ SOLN
INTRAMUSCULAR | Status: DC | PRN
  Administered 2022-07-04: 2 mg via INTRAVENOUS

## 2022-07-04 MED ORDER — AMISULPRIDE (ANTIEMETIC) 5 MG/2ML IV SOLN: 10.0000 mg | Freq: Once | INTRAVENOUS | Status: DC | PRN

## 2022-07-04 MED ORDER — IBUPROFEN 800 MG PO TABS
800.0000 mg | ORAL_TABLET | Freq: Three times a day (TID) | ORAL | 0 refills | Status: DC | PRN
  Filled 2022-07-04: qty 60, 20d supply, fill #0

## 2022-07-04 MED ORDER — BUPIVACAINE HCL (PF) 0.25 % IJ SOLN
INTRAMUSCULAR | Status: DC | PRN
  Administered 2022-07-04: 10 mL

## 2022-07-04 MED ORDER — CHLORHEXIDINE GLUCONATE 0.12 % MT SOLN: 15.0000 mL | Freq: Once | OROMUCOSAL | Status: AC

## 2022-07-04 MED ORDER — LIDOCAINE 2% (20 MG/ML) 5 ML SYRINGE
INTRAMUSCULAR | Status: DC | PRN
  Administered 2022-07-04: 40 mg via INTRAVENOUS

## 2022-07-04 MED ORDER — KETOROLAC TROMETHAMINE 15 MG/ML IJ SOLN
INTRAMUSCULAR | Status: AC
  Administered 2022-07-04: 15 mg via INTRAVENOUS
  Filled 2022-07-04: qty 1

## 2022-07-04 MED ORDER — ACETAMINOPHEN 10 MG/ML IV SOLN: 1000.0000 mg | Freq: Once | INTRAVENOUS | Status: DC | PRN

## 2022-07-04 MED ORDER — ACETAMINOPHEN 160 MG/5ML PO SOLN: 325.0000 mg | ORAL | Status: DC | PRN

## 2022-07-04 MED ORDER — ACETAMINOPHEN 500 MG PO TABS: 1000.0000 mg | ORAL_TABLET | ORAL | Status: AC

## 2022-07-04 MED ORDER — FENTANYL CITRATE (PF) 100 MCG/2ML IJ SOLN: 25.0000 ug | INTRAMUSCULAR | Status: DC | PRN

## 2022-07-04 MED ORDER — OXYCODONE HCL 5 MG/5ML PO SOLN: 5.0000 mg | Freq: Once | ORAL | Status: DC | PRN

## 2022-07-04 MED ORDER — MIDAZOLAM HCL 2 MG/2ML IJ SOLN
INTRAMUSCULAR | Status: AC
  Filled 2022-07-04: qty 2

## 2022-07-04 MED ORDER — DEXAMETHASONE SODIUM PHOSPHATE 10 MG/ML IJ SOLN
INTRAMUSCULAR | Status: AC
  Filled 2022-07-04: qty 1

## 2022-07-04 MED ORDER — LACTATED RINGERS IV SOLN: INTRAVENOUS | Status: DC

## 2022-07-04 MED ORDER — ORAL CARE MOUTH RINSE: 15.0000 mL | Freq: Once | OROMUCOSAL | Status: AC

## 2022-07-04 MED ORDER — SUGAMMADEX SODIUM 200 MG/2ML IV SOLN
INTRAVENOUS | Status: DC | PRN
  Administered 2022-07-04: 200 mg via INTRAVENOUS

## 2022-07-04 MED ORDER — ONDANSETRON HCL 4 MG/2ML IJ SOLN
INTRAMUSCULAR | Status: AC
  Filled 2022-07-04: qty 2

## 2022-07-04 MED ORDER — POVIDONE-IODINE 10 % EX SWAB: 2.0000 | Freq: Once | CUTANEOUS | Status: DC

## 2022-07-04 MED ORDER — FENTANYL CITRATE (PF) 250 MCG/5ML IJ SOLN
INTRAMUSCULAR | Status: AC
  Filled 2022-07-04: qty 5

## 2022-07-04 MED ORDER — OXYCODONE HCL 5 MG PO TABS: 5.0000 mg | ORAL_TABLET | Freq: Once | ORAL | Status: DC | PRN

## 2022-07-04 MED ORDER — PROPOFOL 10 MG/ML IV BOLUS
INTRAVENOUS | Status: DC | PRN
  Administered 2022-07-04: 130 mg via INTRAVENOUS

## 2022-07-04 MED ORDER — OXYCODONE HCL 5 MG PO TABS
5.0000 mg | ORAL_TABLET | ORAL | 0 refills | Status: DC | PRN
  Filled 2022-07-04: qty 12, 2d supply, fill #0

## 2022-07-04 MED ORDER — ROCURONIUM BROMIDE 10 MG/ML (PF) SYRINGE
PREFILLED_SYRINGE | INTRAVENOUS | Status: DC | PRN
  Administered 2022-07-04: 60 mg via INTRAVENOUS

## 2022-07-04 MED ORDER — ONDANSETRON HCL 4 MG/2ML IJ SOLN
INTRAMUSCULAR | Status: DC | PRN
  Administered 2022-07-04: 4 mg via INTRAVENOUS

## 2022-07-04 MED ORDER — PHENYLEPHRINE 80 MCG/ML (10ML) SYRINGE FOR IV PUSH (FOR BLOOD PRESSURE SUPPORT)
PREFILLED_SYRINGE | INTRAVENOUS | Status: DC | PRN
  Administered 2022-07-04 (×4): 80 ug via INTRAVENOUS
  Administered 2022-07-04: 160 ug via INTRAVENOUS

## 2022-07-04 MED ORDER — KETOROLAC TROMETHAMINE 15 MG/ML IJ SOLN: 15.0000 mg | INTRAMUSCULAR | Status: AC

## 2022-07-04 MED ORDER — PHENYLEPHRINE 80 MCG/ML (10ML) SYRINGE FOR IV PUSH (FOR BLOOD PRESSURE SUPPORT)
PREFILLED_SYRINGE | INTRAVENOUS | Status: AC
  Filled 2022-07-04: qty 10

## 2022-07-04 MED ORDER — LIDOCAINE 2% (20 MG/ML) 5 ML SYRINGE
INTRAMUSCULAR | Status: AC
  Filled 2022-07-04: qty 5

## 2022-07-04 MED ORDER — PROMETHAZINE HCL 25 MG/ML IJ SOLN: 6.2500 mg | INTRAMUSCULAR | Status: DC | PRN

## 2022-07-04 SURGICAL SUPPLY — 29 items
ADH SKN CLS APL DERMABOND .7 (GAUZE/BANDAGES/DRESSINGS) ×2
BAG SPEC RTRVL LRG 6X4 10 (ENDOMECHANICALS) ×2
DERMABOND ADVANCED .7 DNX12 (GAUZE/BANDAGES/DRESSINGS) ×3 IMPLANT
DURAPREP 26ML APPLICATOR (WOUND CARE) ×3 IMPLANT
GLOVE BIO SURGEON STRL SZ 6.5 (GLOVE) ×3 IMPLANT
GLOVE BIOGEL PI IND STRL 7.0 (GLOVE) ×9 IMPLANT
GLOVE ECLIPSE 6.5 STRL STRAW (GLOVE) ×3 IMPLANT
GOWN STRL REUS W/ TWL LRG LVL3 (GOWN DISPOSABLE) ×10 IMPLANT
GOWN STRL REUS W/TWL LRG LVL3 (GOWN DISPOSABLE) ×8
KIT TURNOVER KIT B (KITS) ×3 IMPLANT
NDL INSUFFLATION 14GA 120MM (NEEDLE) ×2 IMPLANT
NEEDLE INSUFFLATION 14GA 120MM (NEEDLE) ×2 IMPLANT
PACK LAPAROSCOPY BASIN (CUSTOM PROCEDURE TRAY) ×3 IMPLANT
PACK TRENDGUARD 450 HYBRID PRO (MISCELLANEOUS) ×1 IMPLANT
POUCH SPECIMEN RETRIEVAL 10MM (ENDOMECHANICALS) ×1 IMPLANT
PROTECTOR NERVE ULNAR (MISCELLANEOUS) ×6 IMPLANT
SEALER TISSUE G2 CVD JAW 35 (ENDOMECHANICALS) ×1 IMPLANT
SEALER TISSUE G2 CVD JAW 45CM (ENDOMECHANICALS) ×2
SET TUBE SMOKE EVAC HIGH FLOW (TUBING) ×1 IMPLANT
SLEEVE ADV FIXATION 5X100MM (TROCAR) ×1 IMPLANT
SUT VICRYL 4-0 PS2 18IN ABS (SUTURE) ×3 IMPLANT
TOWEL GREEN STERILE FF (TOWEL DISPOSABLE) ×5 IMPLANT
TRAY FOLEY W/BAG SLVR 14FR (SET/KITS/TRAYS/PACK) ×3 IMPLANT
TRENDGUARD 450 HYBRID PRO PACK (MISCELLANEOUS) ×2
TROCAR 11X100 Z THREAD (TROCAR) ×1 IMPLANT
TROCAR ADV FIXATION 5X100MM (TROCAR) ×3 IMPLANT
TUBE CONNECTING 12X1/4 (SUCTIONS) ×1 IMPLANT
WARMER LAPAROSCOPE (MISCELLANEOUS) ×3 IMPLANT
YANKAUER SUCT BULB TIP NO VENT (SUCTIONS) ×1 IMPLANT

## 2022-07-04 NOTE — Op Note (Signed)
Rema Jasmine PROCEDURE DATE: 07/04/2022   PREOPERATIVE DIAGNOSIS:  Right ovarian dermoid, Undesired fertility  POSTOPERATIVE DIAGNOSIS:  Same  PROCEDURE:  Laparoscopic Bilateral Salpingectomy   SURGEON:  Dr. Radene Gunning  ASSISTANT:  Dr. Mora Bellman. An experienced assistant was required given the standard of surgical care given the complexity of the case.  This assistant was needed for exposure, dissection, suctioning, retraction, instrument exchange, and for overall help during the procedure.  STUDENT: Shirlyn Goltz, MS-3  ANESTHESIA:  General endotracheal, 10 cc of 2.09% marcaine  COMPLICATIONS:  None immediate.  ESTIMATED BLOOD LOSS:  5 ml.  FLUIDS: 700 ml LR.  URINE OUTPUT:  150 ml of clear urine.  INDICATIONS: 41 y.o. [id number] with a right ovarian dermoid and undesired fertility, desires permanent sterilization.     FINDINGS:  Normal uterus, fallopian tubes, and normal left ovary. Right ovary with about a 4-5 cm cyst. Otherwise normal abdominal survey, no scar tissue from prior perforation or surgery.   TECHNIQUE:  The patient was taken to the operating room where general anesthesia was obtained without difficulty.  She was then placed in the dorsal lithotomy position and prepared and draped in sterile fashion.  A foley catheter was placed at the start of the procedure.   A skin incision was made with the 11 blade scalpel in the umbilicus. I entered her abdominal cavity with a veress needle using closed technique. Opening pressure was 1.  Pneumoperitoneum achieved to a pressure of 15.  The 10 mm optiview port was inserted into the abdomen under direct visualization. Below the point of entry and the pelvis was inspected with no evidence of injury. The patient was placed in steep trendelenburg.  The ureter was easily identified on the right and well away from our point of surgery.   The scalpel was then used to make two 1m incsions, one in the LLQ and one in the  RLQ. 566mpedi-ports were inserted. The right IP was cauterized with the Enseal and transected. The uteroovarian ligament was cauterized and transected. The right tube was cauterized and cut at the cornua. The points of surgery were then connected using cautery and cut with the Enseal from the cornua/uteroovarian ligament to where the IP was cauterized and cut.  The peritoneal edge on the right was note actively bleeding but was cauterized along that edge. The ureter was visualized throughout and had normal movement and was 2-3 cm away from the surgical area.  The left fallopian tube was cauterized, cut, and detached from the surrounding pelvic structures with the enseal. No bleeding was noted and the pelvis was noted to be complete hemostatic from all pedicles. The specimens were placed in an endocatch bag and removed through the umbilicus. All ports were then withdrawn and the gas drained from abdomen. They were then closed in a subcuticular fashion with 4-0 vicryl and dressings were placed. The skin was numbed with 0.25% marcaine for a total of 10 cc.  The patient will be discharged to home as per PACU criteria.  Routine postoperative instructions given.  She will follow up in the office in 4 wks for postoperative evaluation.

## 2022-07-04 NOTE — Transfer of Care (Signed)
Immediate Anesthesia Transfer of Care Note  Patient: [facility]  Procedure(s) Performed: LAPAROSCOPIC RIGHT SALPINGO OOPHORECTOMY AND LEFT SALPINGECTOMY (Bilateral)  Patient Location: PACU  Anesthesia Type:General  Level of Consciousness: awake and alert   Airway & Oxygen Therapy: Patient Spontanous Breathing  Post-op Assessment: Report given to RN and Post -op Vital signs reviewed and stable  Post vital signs: Reviewed and stable  Last Vitals:  Vitals Value Taken Time  BP 94/67 07/04/22 0840  Temp    Pulse 66 07/04/22 0842  Resp 13 07/04/22 0842  SpO2 100 % 07/04/22 0842  Vitals shown include unvalidated device data.  Last Pain:  Vitals:   07/04/22 2094  TempSrc:   PainSc: 0-No pain      Patients Stated Pain Goal: 0 (70/96/28 3662)  Complications: No notable events documented.

## 2022-07-04 NOTE — Anesthesia Postprocedure Evaluation (Signed)
Anesthesia Post Note  Patient: Calli Bashor  Procedure(s) Performed: LAPAROSCOPIC RIGHT SALPINGO OOPHORECTOMY AND LEFT SALPINGECTOMY (Bilateral)     Patient location during evaluation: PACU Anesthesia Type: General Level of consciousness: awake and alert Pain management: pain level controlled Vital Signs Assessment: post-procedure vital signs reviewed and stable Respiratory status: spontaneous breathing, nonlabored ventilation, respiratory function stable and patient connected to nasal cannula oxygen Cardiovascular status: blood pressure returned to baseline and stable Postop Assessment: no apparent nausea or vomiting Anesthetic complications: no   No notable events documented.  Last Vitals:  Vitals:   07/04/22 0900 07/04/22 0915  BP: 109/70 98/67  Pulse: (!) 54 (!) 51  Resp: 11 12  Temp:  36.7 C  SpO2: 100% 100%    Last Pain:  Vitals:   07/04/22 0900  TempSrc:   PainSc: 0-No pain                 Effie Berkshire

## 2022-07-04 NOTE — Anesthesia Procedure Notes (Addendum)
Procedure Name: Intubation Date/Time: 07/04/2022 7:37 AM  Performed by: Dorann Lodge, CRNAPre-anesthesia Checklist: Patient identified, Emergency Drugs available, Suction available and Patient being monitored Patient Re-evaluated:Patient Re-evaluated prior to induction Oxygen Delivery Method: Circle System Utilized Preoxygenation: Pre-oxygenation with 100% oxygen Induction Type: IV induction Ventilation: Mask ventilation without difficulty Laryngoscope Size: Mac and 3 Grade View: Grade I Tube type: Oral Number of attempts: 1 Airway Equipment and Method: Stylet Placement Confirmation: ETT inserted through vocal cords under direct vision, positive ETCO2 and breath sounds checked- equal and bilateral Secured at: 22 cm Tube secured with: Tape Dental Injury: Teeth and Oropharynx as per pre-operative assessment

## 2022-07-04 NOTE — Anesthesia Preprocedure Evaluation (Addendum)
Anesthesia Evaluation  Patient identified by MRN, date of birth, ID band Patient awake    Reviewed: Allergy & Precautions, NPO status , Patient's Chart, lab work & pertinent test results  Airway Mallampati: I  TM Distance: >3 FB Neck ROM: Full    Dental  (+) Teeth Intact, Dental Advisory Given   Pulmonary former smoker   breath sounds clear to auscultation       Cardiovascular negative cardio ROS  Rhythm:Regular Rate:Normal     Neuro/Psych    GI/Hepatic Neg liver ROS,,,  Endo/Other  negative endocrine ROS    Renal/GU      Musculoskeletal   Abdominal   Peds  Hematology negative hematology ROS (+)   Anesthesia Other Findings   Reproductive/Obstetrics                             Anesthesia Physical Anesthesia Plan  ASA: 2  Anesthesia Plan: General   Post-op Pain Management:    Induction: Intravenous  PONV Risk Score and Plan: 4 or greater and Ondansetron, Dexamethasone, Midazolam and Scopolamine patch - Pre-op  Airway Management Planned: Oral ETT  Additional Equipment: None  Intra-op Plan:   Post-operative Plan: Extubation in OR  Informed Consent: I have reviewed the patients History and Physical, chart, labs and discussed the procedure including the risks, benefits and alternatives for the proposed anesthesia with the patient or authorized representative who has indicated his/her understanding and acceptance.     Dental advisory given  Plan Discussed with: CRNA  Anesthesia Plan Comments:        Anesthesia Quick Evaluation

## 2022-07-04 NOTE — H&P (Signed)
[facility] Attending History and Physical  Lauren [facility] is a 41 y.o. G4P4000 who presented to the hospital originally in May and was diagnosed with a right ovarian dermoid incidentally in the setting of perforated diverticulitis. She recovered from that and then was ready for surgery for removal of the dermoid. We discussed the options for removal of the dermoid and she wanted complete removal of the ovary due to risk of recurrence of the dermoid and weighing her risk at her age of premature menopause (low).   Originally, she had wanted her IUD removed and both tubes removed. We discussed this today and the different combinations of surgery we could do and ultimately she would like to have the IUD kept in and would like both of her tubes removed.   Past Medical History:  Diagnosis Date   ADD (attention deficit disorder)    ADHD (attention deficit hyperactivity disorder)    Allergy    COVID 04/2021   mild case, but had flu A and Flu B at the same   Cyst, ovary, dermoid    Diverticulitis    Hypocalcemia    IUD    mirena   Low hemoglobin    Vaginal Pap smear, abnormal    Viral gastroenteritis 07/12/2015   Past Surgical History:  Procedure Laterality Date   CHOLECYSTECTOMY  2000   OB History  Gravida Para Term Preterm AB Living  '4 4 4        '$ SAB IAB Ectopic Multiple Live Births               # Outcome Date GA Lbr Len/2nd Weight Sex Delivery Anes PTL Lv  4 Term           3 Term           2 Term           1 Term           Patient denies any other pertinent gynecologic issues.  No current facility-administered medications on file prior to encounter.   Current Outpatient Medications on File Prior to Encounter  Medication Sig Dispense Refill   levonorgestrel (MIRENA) 20 MCG/24HR IUD 1 each by Intrauterine route once.     Allergies  Allergen Reactions   Penicillins Itching    Social History:   reports that she quit smoking about 2 years  ago. Her smoking use included cigarettes. She has never used smokeless tobacco. She reports current alcohol use. She reports that she does not use drugs. Family History  Problem Relation Age of Onset   Cancer Maternal Grandmother        ovarian or cervical   Diabetes Other    Colon cancer Neg Hx    Esophageal cancer Neg Hx    Rectal cancer Neg Hx     Review of Systems: Pertinent items noted in HPI and remainder of comprehensive ROS otherwise negative.  PHYSICAL EXAM: Blood pressure 104/78, pulse 72, temperature 98.2 F (36.8 C), temperature source Oral, resp. rate 17, height '5\' 4"'$  (1.626 m), weight 56.7 kg, SpO2 100 %. CONSTITUTIONAL: Well-developed, well-nourished female in no acute distress.  HENT:  Normocephalic, atraumatic, External right and left ear normal. Oropharynx is clear and moist EYES: Conjunctivae and EOM are normal. Pupils are equal, round, and reactive to light. No scleral icterus.  NECK: Normal range of motion, supple, no masses SKIN: Skin is warm and dry. No rash noted. Not diaphoretic. No erythema. No pallor. NEUROLOGIC: Alert and  oriented to person, place, and time. Normal reflexes, muscle tone coordination. No cranial nerve deficit noted. PSYCHIATRIC: Normal mood and affect. Normal behavior. Normal judgment and thought content. CARDIOVASCULAR: Normal heart rate noted, regular rhythm RESPIRATORY: Effort and breath sounds normal, no problems with respiration noted ABDOMEN: Soft, nontender, nondistended. PELVIC: Deferred MUSCULOSKELETAL: Normal range of motion. No tenderness.  No cyanosis, clubbing, or edema.  2+ distal pulses.  Labs: Results for orders placed or performed during the hospital encounter of 07/04/22 (from the past 336 hour(s))  Pregnancy, urine POC   Collection Time: 07/04/22  6:14 AM  Result Value Ref Range   Preg Test, Ur NEGATIVE NEGATIVE  Results for orders placed or performed during the hospital encounter of 06/26/22 (from the past 336 hour(s))   Type and screen [place]   Collection Time: 06/26/22  8:41 AM  Result Value Ref Range   ABO/RH(D) A POS    Antibody Screen NEG    Sample Expiration 07/10/2022,2359    Extend sample reason      NO TRANSFUSIONS OR PREGNANCY IN THE PAST 3 MONTHS Performed at [facility], [address] [place], [place] [address]   CBC per protocol   Collection Time: 06/26/22  8:53 AM  Result Value Ref Range   WBC 7.4 4.0 - 10.5 K/uL   RBC 3.92 3.87 - 5.11 MIL/uL   Hemoglobin 12.1 12.0 - 15.0 g/dL   HCT 37.1 36.0 - 46.0 %   MCV 94.6 80.0 - 100.0 fL   MCH 30.9 26.0 - 34.0 pg   MCHC 32.6 30.0 - 36.0 g/dL   RDW 13.3 11.5 - 15.5 %   Platelets 303 150 - 400 K/uL   nRBC 0.0 0.0 - 0.2 %    Imaging Studies: No results found.  Assessment: Active Problems:   Dermoid cyst of ovary, right   Plan: - Diagnosis: Right ovarian dermoid, done with child bearing - Planned surgery: L/S RSO, and left salpingectomy. We discussed the pros/cons of removal of ovary versus cystectomy. We reviewed risks of each surgery and she would like removal of the ovary.  She will keep her IUD in. We reviewed if significant scar tissue around her left tube from her prior bowel perforation and I couldn't remove the tube without significant surgery I would leave it in. She was agreeable to this plan.  - Risks of surgery include but are not limited to: bleeding, infection, injury to surrounding organs/tissues (i.e. bowel/bladder/ureters), need for additional procedures, wound complications, hospital re-admission, and conversion to open surgery, VTE - New consent reviewed and signed.  - All questions answered by her and spouse, Georgiana Shore, MD, [place], [facility] for [employer], [place]

## 2022-07-05 ENCOUNTER — Encounter (HOSPITAL_COMMUNITY): Payer: Self-pay | Admitting: Obstetrics and Gynecology

## 2022-07-05 LAB — ABO/RH: ABO/RH(D): A POS

## 2022-07-07 LAB — SURGICAL PATHOLOGY

## 2022-10-31 ENCOUNTER — Other Ambulatory Visit: Payer: Self-pay | Admitting: Family Medicine

## 2022-10-31 DIAGNOSIS — Z1239 Encounter for other screening for malignant neoplasm of breast: Secondary | ICD-10-CM

## 2022-10-31 NOTE — Progress Notes (Signed)
Orders Placed This Encounter  Procedures   MM 3D SCREENING MAMMOGRAM BILATERAL BREAST    Standing Status:   Future    Standing Expiration Date:   10/31/2023    Scheduling Instructions:     Pls call pt to schedule    Order Specific Question:   Reason for Exam (SYMPTOM  OR DIAGNOSIS REQUIRED)    Answer:   breast cancer screening    Order Specific Question:   Is the patient pregnant?    Answer:   No    Order Specific Question:   Preferred imaging location?    Answer:   [facility]

## 2022-11-01 ENCOUNTER — Other Ambulatory Visit (HOSPITAL_BASED_OUTPATIENT_CLINIC_OR_DEPARTMENT_OTHER): Payer: Self-pay

## 2022-11-01 ENCOUNTER — Other Ambulatory Visit: Payer: Self-pay

## 2022-11-01 DIAGNOSIS — F988 Other specified behavioral and emotional disorders with onset usually occurring in childhood and adolescence: Secondary | ICD-10-CM

## 2022-11-01 MED ORDER — AMPHETAMINE-DEXTROAMPHETAMINE 20 MG PO TABS
20.0000 mg | ORAL_TABLET | Freq: Two times a day (BID) | ORAL | 0 refills | Status: DC
  Filled 2022-11-01: qty 180, 90d supply, fill #0

## 2022-11-01 NOTE — Telephone Encounter (Signed)
Last filled in September. She has a follow up scheduled for 11/22/22.

## 2022-11-03 ENCOUNTER — Telehealth: Payer: Self-pay

## 2022-11-03 NOTE — Telephone Encounter (Signed)
Patient had episode of sweating, near syncope, felt poorly-  laying on floor.episode lasted about 7 to 10 mins and patient started to feel better.given glass of water-  Dr. Zigmund Daniel came out to help situation.,   Patient's BS  =104 BP= 129/94.she  was found to not have eaten today. She is scheduled for a visit with Dr. Madilyn Fireman on Tuesday 11/07/2022.

## 2022-11-07 ENCOUNTER — Ambulatory Visit (INDEPENDENT_AMBULATORY_CARE_PROVIDER_SITE_OTHER): Payer: 59 | Admitting: Family Medicine

## 2022-11-07 VITALS — BP 131/78 | HR 87 | Ht 64.0 in | Wt 126.0 lb

## 2022-11-07 DIAGNOSIS — F988 Other specified behavioral and emotional disorders with onset usually occurring in childhood and adolescence: Secondary | ICD-10-CM

## 2022-11-07 DIAGNOSIS — R55 Syncope and collapse: Secondary | ICD-10-CM | POA: Diagnosis not present

## 2022-11-07 NOTE — Progress Notes (Signed)
   Established Patient Office Visit  Subjective   Patient ID: Lauren Franklin, female    DOB: October 18, 1980  Age: 42 y.o. MRN: [id number]  Chief Complaint  Patient presents with   Near Syncope    HPI  Twice in one week she almost passed out.  She reports she probably doesn't stay well hydrated. Her urine has been dark. Broke out in a sweat before the episode starts.  She could tell that she was starting to feel lightheaded.  In the past when it happens she is usually just able to sit down and it goes away after a few minutes.  The last time it happened last Wednesday she had not had much to eat just a couple of bites and really had not had much fluid.  She has not had any more episodes since it happened last week she has been really trying to push fluids and stay well-hydrated.  No recent chest pain or palpitations.  ADD - Reports symptoms are well controlled on current regime. Denies any problems with insomnia, chest pain, palpitations, or SOB.       ROS    Objective:     BP 131/78   Pulse 87   Ht 5\' 4"  (1.626 m)   Wt 126 lb (57.2 kg)   SpO2 100%   BMI 21.63 kg/m    Physical Exam Vitals and nursing note reviewed.  Constitutional:      Appearance: She is well-developed.  HENT:     Head: Normocephalic and atraumatic.  Cardiovascular:     Rate and Rhythm: Normal rate and regular rhythm.     Heart sounds: Normal heart sounds.  Pulmonary:     Effort: Pulmonary effort is normal.     Breath sounds: Normal breath sounds.  Skin:    General: Skin is warm and dry.  Neurological:     Mental Status: She is alert and oriented to person, place, and time.  Psychiatric:        Behavior: Behavior normal.      Results for orders placed or performed in visit on 11/07/22  Results Console HPV  Result Value Ref Range   CHL HPV Negative       The 10-year ASCVD risk score (Arnett DK, et al., 2019) is: 0.4%    Assessment & Plan:   Problem List Items Addressed This Visit        Other   ADD (attention deficit disorder)    Well controlled. Continue current regimen. Follow up in  42mo   Refills up to date.        Other Visit Diagnoses     Near syncope    -  Primary   Relevant Orders   CBC with Differential/Platelet   Urinalysis, Routine w reflex microscopic   EKG 12-Lead   Comprehensive Metabolic Panel (CMET)   TSH      Near syncope-suspect probably related to decreased oral intake as well as some mild dehydration.  No concerning findings.  EKG was reassuring.  EKG today shows rate of 90 bpm, normal sinus rhythm with no acute ST-T wave changes.  Will get updated labs just to rule out electrolyte disorder, anemia or thyroid dysfunction.  She is gena continue to push fluids and if that is helping then we will continue to monitor.  If she gets recurrent symptoms then consider further cardiac workup with an echocardiogram and Zio patch.  No follow-ups on file.    Beatrice Lecher, MD

## 2022-11-07 NOTE — Assessment & Plan Note (Signed)
Well controlled. Continue current regimen. Follow up in  28mo   Refills up to date.

## 2022-11-22 ENCOUNTER — Ambulatory Visit: Payer: 59 | Admitting: Family Medicine

## 2022-12-13 ENCOUNTER — Ambulatory Visit (INDEPENDENT_AMBULATORY_CARE_PROVIDER_SITE_OTHER): Payer: 59

## 2022-12-13 DIAGNOSIS — Z1231 Encounter for screening mammogram for malignant neoplasm of breast: Secondary | ICD-10-CM

## 2022-12-13 DIAGNOSIS — Z1239 Encounter for other screening for malignant neoplasm of breast: Secondary | ICD-10-CM

## 2022-12-15 NOTE — Progress Notes (Signed)
Lauren Franklin, mammo shows questionable area in the left breast. Imaging dept will be calling ofr additional imaging

## 2022-12-18 ENCOUNTER — Other Ambulatory Visit: Payer: Self-pay | Admitting: Family Medicine

## 2022-12-18 DIAGNOSIS — R928 Other abnormal and inconclusive findings on diagnostic imaging of breast: Secondary | ICD-10-CM

## 2023-01-31 ENCOUNTER — Ambulatory Visit
Admission: RE | Admit: 2023-01-31 | Discharge: 2023-01-31 | Disposition: A | Discharge status: Home / Self Care | Payer: 59 | Source: Ambulatory Visit | Attending: Family Medicine | Admitting: Family Medicine

## 2023-01-31 DIAGNOSIS — R928 Other abnormal and inconclusive findings on diagnostic imaging of breast: Secondary | ICD-10-CM

## 2023-01-31 DIAGNOSIS — N6012 Diffuse cystic mastopathy of left breast: Secondary | ICD-10-CM | POA: Diagnosis not present

## 2023-02-01 NOTE — Progress Notes (Signed)
HI Jenn, so glad it turned out to just be cysts.  I hope you are having a good week.

## 2023-02-13 ENCOUNTER — Other Ambulatory Visit: Payer: Self-pay | Admitting: *Deleted

## 2023-02-13 ENCOUNTER — Other Ambulatory Visit: Payer: Self-pay

## 2023-02-13 ENCOUNTER — Other Ambulatory Visit (HOSPITAL_BASED_OUTPATIENT_CLINIC_OR_DEPARTMENT_OTHER): Payer: Self-pay

## 2023-02-13 DIAGNOSIS — F988 Other specified behavioral and emotional disorders with onset usually occurring in childhood and adolescence: Secondary | ICD-10-CM

## 2023-02-13 MED ORDER — SCOPOLAMINE 1 MG/3DAYS TD PT72: 1.0000 | MEDICATED_PATCH | TRANSDERMAL | 1 refills | Status: DC

## 2023-02-13 MED ORDER — SCOPOLAMINE 1 MG/3DAYS TD PT72
1.0000 | MEDICATED_PATCH | TRANSDERMAL | 1 refills | Status: DC
  Filled 2023-02-13: qty 4, 12d supply, fill #0

## 2023-02-13 MED ORDER — AMPHETAMINE-DEXTROAMPHETAMINE 20 MG PO TABS
20.0000 mg | ORAL_TABLET | Freq: Two times a day (BID) | ORAL | 0 refills | Status: DC
Start: 2023-02-13 — End: 2023-05-25
  Filled 2023-02-13: qty 180, 90d supply, fill #0

## 2023-02-19 ENCOUNTER — Encounter: Payer: 59 | Admitting: Family Medicine

## 2023-03-26 ENCOUNTER — Ambulatory Visit (INDEPENDENT_AMBULATORY_CARE_PROVIDER_SITE_OTHER): Payer: 59 | Admitting: Family Medicine

## 2023-03-26 ENCOUNTER — Encounter: Payer: Self-pay | Admitting: Family Medicine

## 2023-03-26 VITALS — BP 115/59 | HR 67 | Ht 64.0 in | Wt 129.0 lb

## 2023-03-26 DIAGNOSIS — Z1159 Encounter for screening for other viral diseases: Secondary | ICD-10-CM | POA: Diagnosis not present

## 2023-03-26 DIAGNOSIS — Z0000 Encounter for general adult medical examination without abnormal findings: Secondary | ICD-10-CM | POA: Diagnosis not present

## 2023-03-26 NOTE — Progress Notes (Signed)
Complete physical exam  Patient: Lauren Franklin   DOB: [birth year]   42 y.o. Female  MRN: [id number]  Subjective:    Chief Complaint  Patient presents with   Annual Exam    Telethia Strozier is a 42 y.o. female who presents today for a complete physical exam. She reports consuming a general diet.  She does get out and play volleyball with her daughter almost daily.  She generally feels well. She reports sleeping fairly well. She does not have additional problems to discuss today.    Mammo done in June.  Colonoscopy up-to-date.  Most recent fall risk assessment:    03/26/2023    9:00 AM  Fall Risk   Falls in the past year? 0  Number falls in past yr: 0  Injury with Fall? 0  Risk for fall due to : No Fall Risks  Follow up Falls evaluation completed     Most recent depression screenings:    03/26/2023    9:05 AM 08/17/2021    9:44 AM  PHQ 2/9 Scores  PHQ - 2 Score 0 0        Patient Care Team: Agapito Games, MD as PCP - General   Outpatient Medications Prior to Visit  Medication Sig   amphetamine-dextroamphetamine (ADDERALL) 20 MG tablet Take 1 tablet (20 mg total) by mouth 2 (two) times daily.   levonorgestrel (MIRENA) 20 MCG/24HR IUD 1 each by Intrauterine route once.   [DISCONTINUED] scopolamine (TRANSDERM-SCOP) 1 MG/3DAYS Place 1 patch (1.5 mg total) onto the skin every 3 (three) days.   No facility-administered medications prior to visit.    ROS        Objective:     BP (!) 115/59   Pulse 67   Ht 5\' 4"  (1.626 m)   Wt 129 lb (58.5 kg)   SpO2 95%   BMI 22.14 kg/m    Physical Exam Constitutional:      Appearance: Normal appearance. She is well-developed.  HENT:     Head: Normocephalic and atraumatic.     Right Ear: Tympanic membrane, ear canal and external ear normal.     Left Ear: Tympanic membrane, ear canal and external ear normal.     Nose: Nose normal.     Mouth/Throat:     Pharynx: Oropharynx is clear.  Eyes:      Conjunctiva/sclera: Conjunctivae normal.     Pupils: Pupils are equal, round, and reactive to light.  Neck:     Thyroid: No thyromegaly.  Cardiovascular:     Rate and Rhythm: Normal rate and regular rhythm.     Heart sounds: Normal heart sounds.  Pulmonary:     Effort: Pulmonary effort is normal.     Breath sounds: Normal breath sounds. No wheezing.  Musculoskeletal:     Cervical back: Neck supple.  Lymphadenopathy:     Cervical: No cervical adenopathy.  Skin:    General: Skin is warm and dry.  Neurological:     Mental Status: She is alert and oriented to person, place, and time.      No results found for any visits on 03/26/23.     Assessment & Plan:    Routine Health Maintenance and Physical Exam  Immunization History  Administered Date(s) Administered   HPV 9-valent 03/20/2019, 06/02/2019, 10/03/2019   Influenza, Quadrivalent, Recombinant, Inj, Pf 05/21/2020   Influenza,inj,Quad PF,6+ Mos 06/13/2013, 06/11/2015, 06/08/2016   Influenza-Unspecified 05/28/2017, 06/11/2018, 06/13/2019, 06/15/2021, 06/01/2022   Tdap 07/16/2013  Health Maintenance  Topic Date Due   COVID-19 Vaccine (1 - 2023-24 season) 09/26/2023 (Originally 04/21/2022)   INFLUENZA VACCINE  11/19/2023 (Originally 03/22/2023)   Hepatitis C Screening  08/17/2024 (Originally 01/01/1999)   DTaP/Tdap/Td (2 - Td or Tdap) 07/17/2023   PAP SMEAR-Modifier  03/19/2024   HPV VACCINES  Completed   HIV Screening  Completed   Pneumococcal Vaccine 15-1 Years old  Aged Out    Discussed health benefits of physical activity, and encouraged her to engage in regular exercise appropriate for her age and condition.  Problem List Items Addressed This Visit   None Visit Diagnoses     Wellness examination    -  Primary   Relevant Orders   Lipid Panel With LDL/HDL Ratio   CMP14+EGFR   TSH   Hepatitis C Antibody   Encounter for hepatitis C screening test for low risk patient       Relevant Orders   Hepatitis C Antibody       Keep up a regular exercise program and make sure you are eating a healthy diet Try to eat 4 servings of dairy a day, or if you are lactose intolerant take a calcium with vitamin D daily.  Your vaccines are up to date.    Return in about 6 months (around 09/26/2023) for ADD medications .     Nani Gasser, MD

## 2023-03-27 NOTE — Progress Notes (Signed)
Hi Lauren Franklin, cholesterol looks great.  Kidney and liver function are normal as well.  Thyroid looks perfect.  Negative for hepatitis C.  We just screen once in your lifetime.

## 2023-05-25 ENCOUNTER — Other Ambulatory Visit (HOSPITAL_BASED_OUTPATIENT_CLINIC_OR_DEPARTMENT_OTHER): Payer: Self-pay

## 2023-05-25 ENCOUNTER — Other Ambulatory Visit: Payer: Self-pay

## 2023-05-25 DIAGNOSIS — F988 Other specified behavioral and emotional disorders with onset usually occurring in childhood and adolescence: Secondary | ICD-10-CM

## 2023-05-25 MED ORDER — AMPHETAMINE-DEXTROAMPHETAMINE 20 MG PO TABS
20.0000 mg | ORAL_TABLET | Freq: Two times a day (BID) | ORAL | 0 refills | Status: DC
  Filled 2023-05-25: qty 180, 90d supply, fill #0

## 2023-05-29 ENCOUNTER — Other Ambulatory Visit (HOSPITAL_BASED_OUTPATIENT_CLINIC_OR_DEPARTMENT_OTHER): Payer: Self-pay

## 2023-06-05 ENCOUNTER — Other Ambulatory Visit (HOSPITAL_BASED_OUTPATIENT_CLINIC_OR_DEPARTMENT_OTHER): Payer: Self-pay

## 2023-06-13 ENCOUNTER — Ambulatory Visit (INDEPENDENT_AMBULATORY_CARE_PROVIDER_SITE_OTHER): Payer: 59 | Admitting: Physician Assistant

## 2023-06-13 DIAGNOSIS — R6889 Other general symptoms and signs: Secondary | ICD-10-CM | POA: Diagnosis not present

## 2023-06-13 DIAGNOSIS — J101 Influenza due to other identified influenza virus with other respiratory manifestations: Secondary | ICD-10-CM | POA: Diagnosis not present

## 2023-06-13 DIAGNOSIS — J029 Acute pharyngitis, unspecified: Secondary | ICD-10-CM

## 2023-06-13 LAB — POCT INFLUENZA A/B
Influenza A, POC: POSITIVE — AB
Influenza B, POC: POSITIVE — AB

## 2023-06-13 LAB — POCT RAPID STREP A (OFFICE): Rapid Strep A Screen: NEGATIVE

## 2023-06-13 LAB — POC COVID19 BINAXNOW: SARS Coronavirus 2 Ag: NEGATIVE

## 2023-06-13 NOTE — Progress Notes (Signed)
..   Results for orders placed or performed in visit on 06/13/23  POCT rapid strep A  Result Value Ref Range   Rapid Strep A Screen Negative Negative  POCT Influenza A/B  Result Value Ref Range   Influenza A, POC Positive (A) Negative   Influenza B, POC Positive (A) Negative  POC COVID-19  Result Value Ref Range   SARS Coronavirus 2 Ag Negative Negative   Positive for Flu A and B Pt will call health at work and report positive testing Do not return to work until fever free for 24 hours Symptomatic care discussed Pt declined tamiflu or other preventative

## 2023-08-17 IMAGING — CT CT ABD-PELV W/ CM
2 of 5 series · 15 of 46 positions shown, 17 images · IV contrast (agent unspecified)
Comparison: None.

CLINICAL DATA: LLQ abdominal pain

EXAM:
CT ABDOMEN AND PELVIS WITH CONTRAST
TECHNIQUE: Multidetector CT imaging of the abdomen and pelvis was performed
using the standard protocol following bolus administration of
intravenous contrast.

[Series 2: axial st · axial · 0.59mm/px · z∈[-482,-102]mm · 12 of 88 slices shown, 14 images]
[im 6/88  soft-tissue]
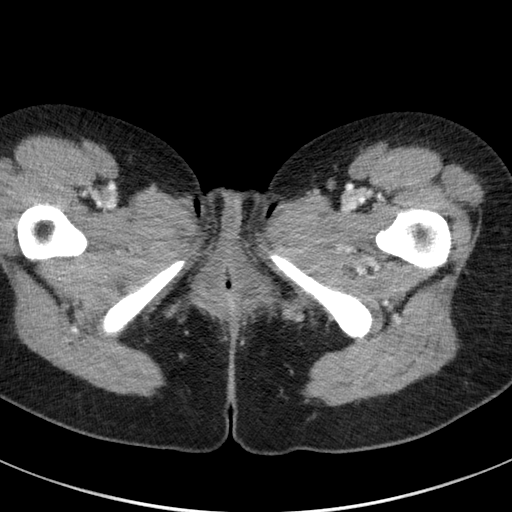
[im 6/88  bone]
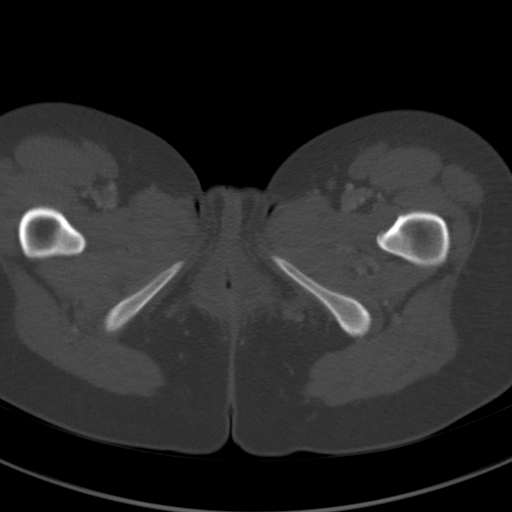
[im 16/88  soft-tissue]
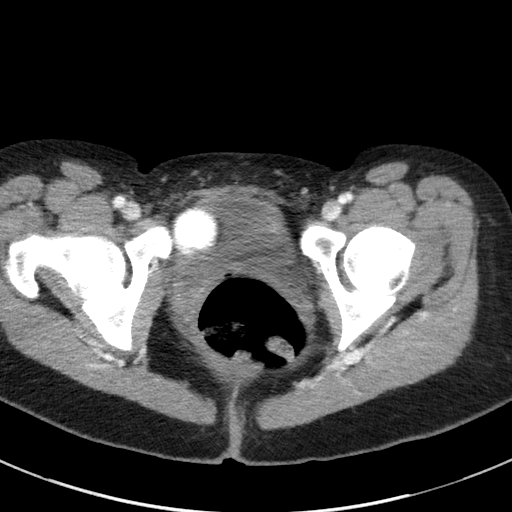
[im 21/88  soft-tissue]
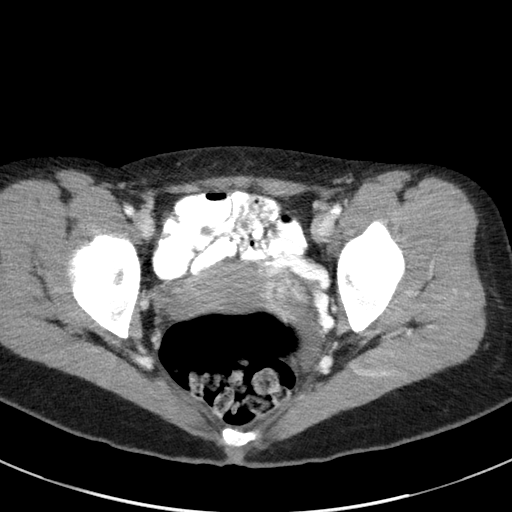
[im 26/88  soft-tissue]
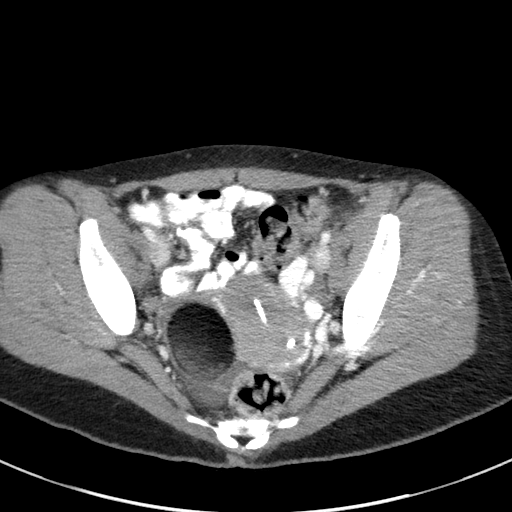
[im 36/88  soft-tissue]
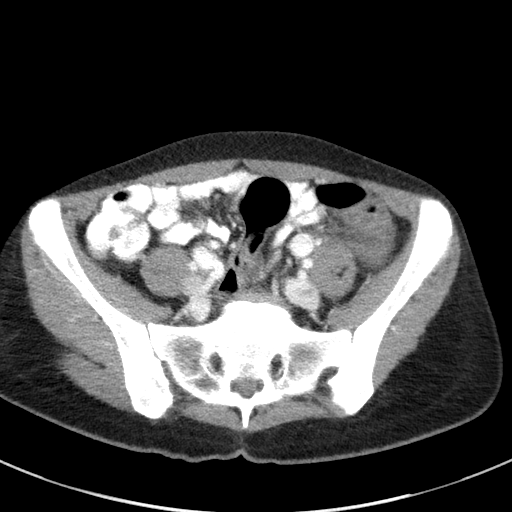
[im 41/88  soft-tissue]
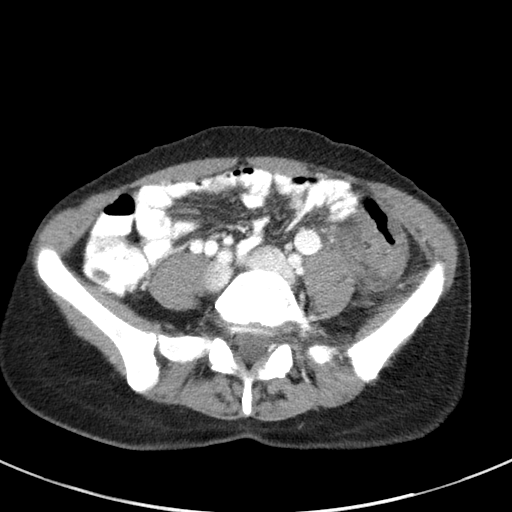
[im 47/88  soft-tissue]
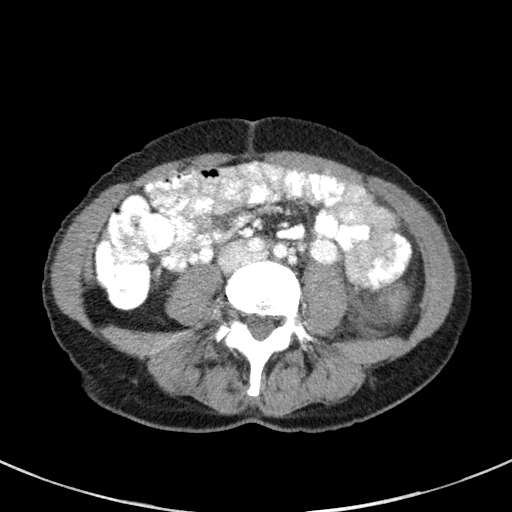
[im 57/88  soft-tissue]
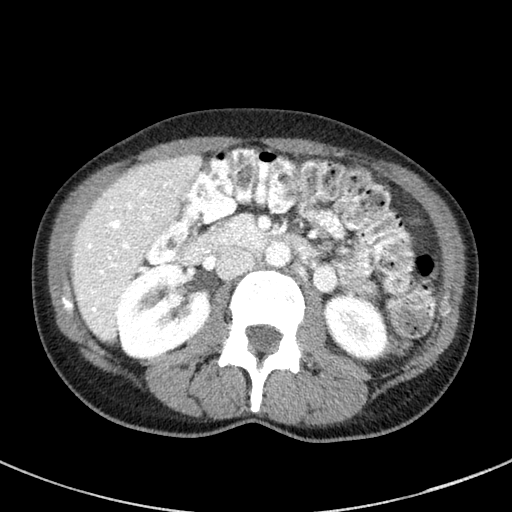
[im 62/88  soft-tissue]
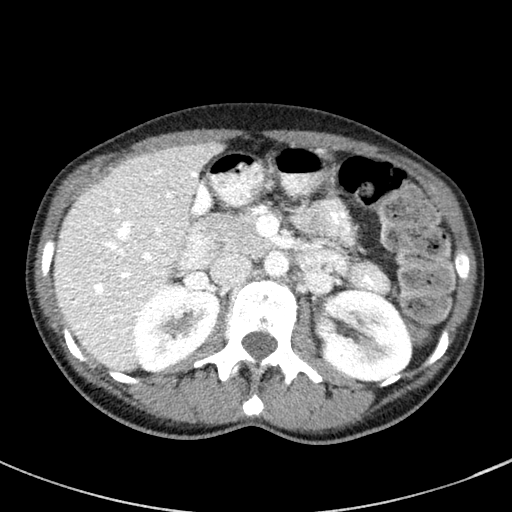
[im 62/88  bone]
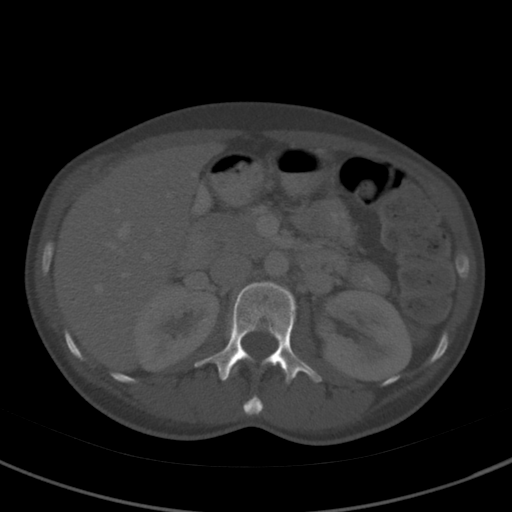
[im 67/88  soft-tissue]
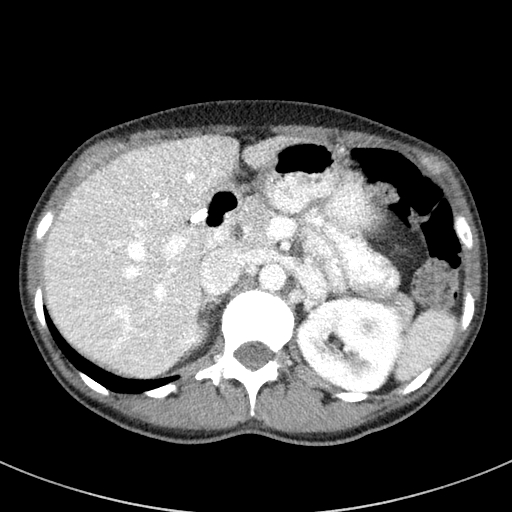
[im 77/88  soft-tissue]
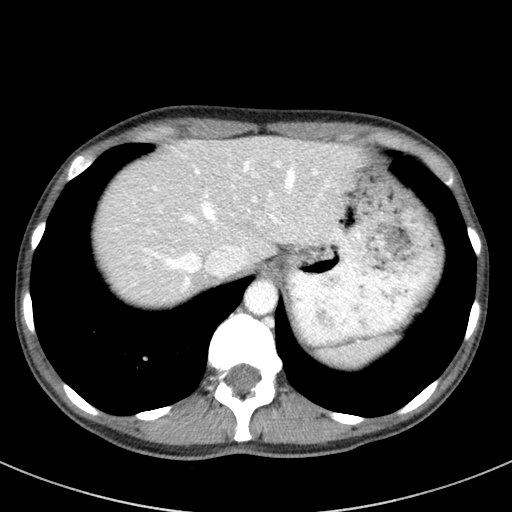
[im 82/88  soft-tissue]
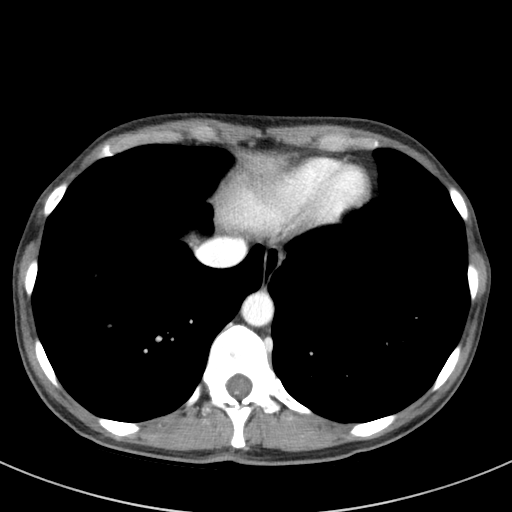

[Series 5: coronal st · coronal · 0.55mm/px · 3 of 66 slices shown]
[im 22/66  soft-tissue]
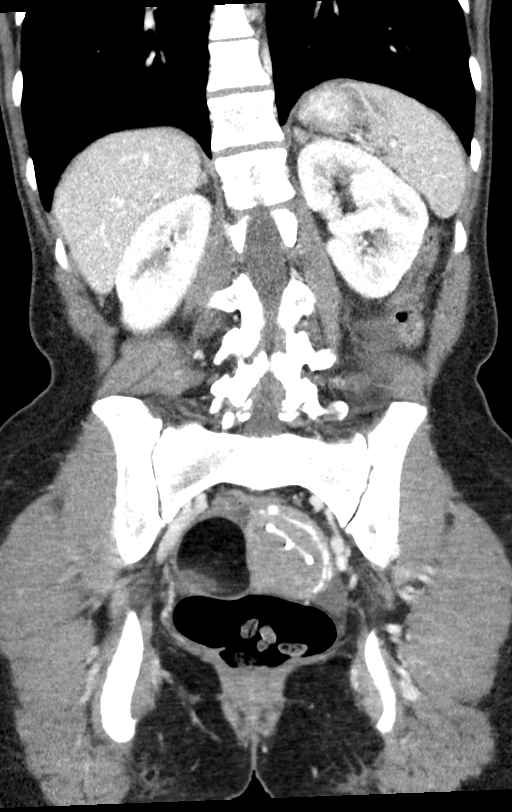
[im 29/66  soft-tissue]
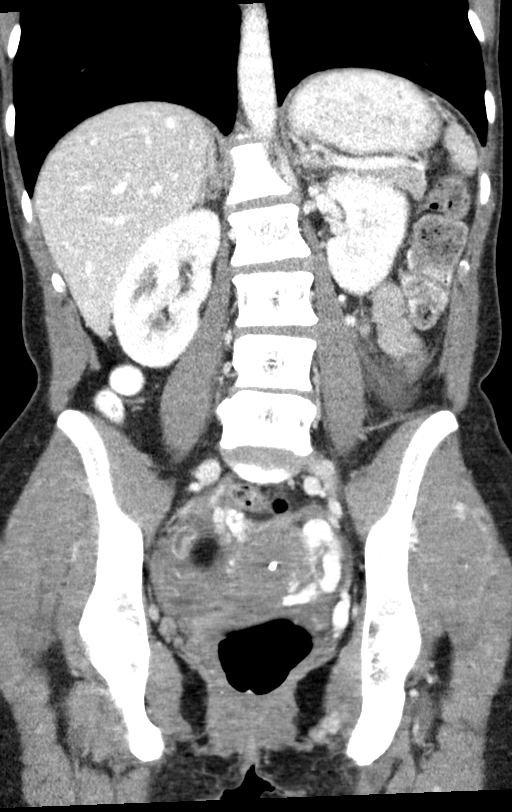
[im 37/66  soft-tissue]
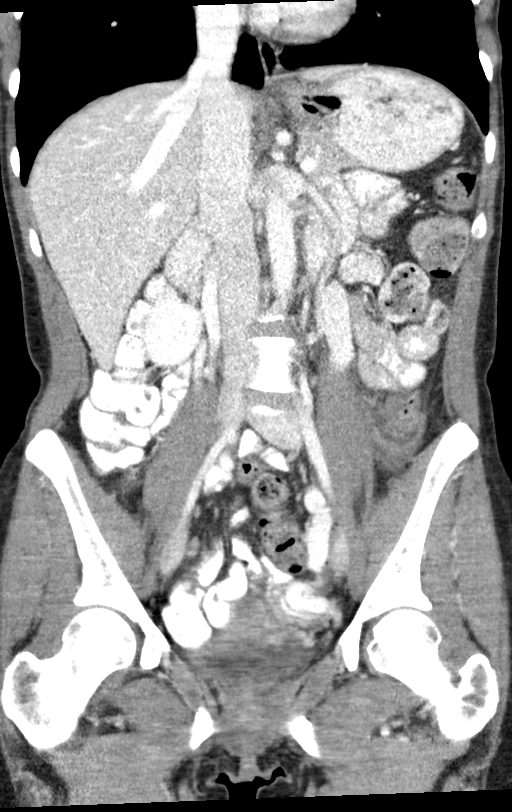

[15 of 46 positions shown; findings below may reference images not displayed]

RADIATION DOSE REDUCTION: This exam was performed according to the
departmental dose-optimization program which includes automated
exposure control, adjustment of the mA and/or kV according to
patient size and/or use of iterative reconstruction technique.

CONTRAST:  100mL OMNIPAQUE IOHEXOL 300 MG/ML  SOLN
FINDINGS: Lower chest: No acute abnormality.

Hepatobiliary: No focal liver abnormality is seen. Prior
cholecystectomy.

Pancreas: Unremarkable. No pancreatic ductal dilatation or
surrounding inflammatory changes.

Spleen: Normal in size without focal abnormality.

Adrenals/Urinary Tract: Adrenal glands are unremarkable. No
hydronephrosis or nephrolithiasis. The bladder is minimally
distended.

Stomach/Bowel: The stomach is within normal limits. There is no
evidence of bowel obstruction.The appendix is normal. There is wall
thickening and inflammatory stranding along the mid to distal
descending colon. There is a small adjacent collection measuring
x 1.0 x 1.7 cm along the medial aspect near the descending-sigmoid
junction consistent with a contained perforation (series 2, image
49).

Vascular/Lymphatic: There is an pelvic vascular congestion, left
greater than right. No AAA. No lymphadenopathy.

Reproductive: There is an IUD noted within a retroverted uterus.
There is a fat containing mass in the right adnexa with soft tissue
component measuring up to 5.3 x 3.8 x 4.6 cm.

Other: Trace free fluid in the pelvis, nonspecific. No
focal/drainable fluid collection.

Musculoskeletal: No acute or significant osseous findings.
IMPRESSION: Acute diverticulitis/colitis of the mid to distal descending colon,
with adjacent small collection along the distal aspect measuring
x 1.0 x 1.7 cm consistent with a contained perforation.

Right ovarian dermoid measuring up to 5.3 cm. Recommend gyn
referral/consultation.

IUD is in place within a retroverted uterus.

These results will be called to the ordering clinician or
representative by the Radiologist Assistant, and communication
documented in the PACS or [REDACTED].

## 2023-08-26 ENCOUNTER — Encounter: Payer: Self-pay | Admitting: Family Medicine

## 2023-08-26 MED ORDER — CLINDAMYCIN HCL 300 MG PO CAPS: 300.0000 mg | ORAL_CAPSULE | Freq: Three times a day (TID) | ORAL | 0 refills | Status: AC

## 2023-08-27 ENCOUNTER — Encounter: Payer: Self-pay | Admitting: Family Medicine

## 2023-08-27 ENCOUNTER — Other Ambulatory Visit (HOSPITAL_BASED_OUTPATIENT_CLINIC_OR_DEPARTMENT_OTHER): Payer: Self-pay

## 2023-08-27 ENCOUNTER — Ambulatory Visit (INDEPENDENT_AMBULATORY_CARE_PROVIDER_SITE_OTHER): Payer: 59 | Admitting: Family Medicine

## 2023-08-27 VITALS — BP 107/64 | HR 75 | Ht 64.0 in | Wt 129.0 lb

## 2023-08-27 DIAGNOSIS — F988 Other specified behavioral and emotional disorders with onset usually occurring in childhood and adolescence: Secondary | ICD-10-CM

## 2023-08-27 DIAGNOSIS — R41840 Attention and concentration deficit: Secondary | ICD-10-CM

## 2023-08-27 DIAGNOSIS — K047 Periapical abscess without sinus: Secondary | ICD-10-CM

## 2023-08-27 DIAGNOSIS — Z23 Encounter for immunization: Secondary | ICD-10-CM

## 2023-08-27 MED ORDER — AMPHETAMINE-DEXTROAMPHETAMINE 20 MG PO TABS
20.0000 mg | ORAL_TABLET | Freq: Two times a day (BID) | ORAL | 0 refills | Status: DC
  Filled 2023-08-27: qty 180, 90d supply, fill #0
  Filled 2023-09-12: qty 35, 18d supply, fill #0
  Filled 2023-09-12: qty 145, 72d supply, fill #0

## 2023-08-27 NOTE — Assessment & Plan Note (Signed)
 Well controlled. Continue current regimen. Follow up in  6 mo

## 2023-08-27 NOTE — Progress Notes (Signed)
   Established Patient Office Visit  Subjective  Patient ID: Lauren Franklin, female    DOB: 1981-03-19  Age: 43 y.o. MRN: [id number]  Chief Complaint  Patient presents with   ADD    HPI ADD - Reports symptoms are well controlled on current regime. Denies any problems with insomnia, chest pain, palpitations, or SOB.    Also had reach out over the weekend about a possible abscess tooth.  She is having some pain around her right upper molar on the outer side.  She says is not as swollen as it was yesterday.  Does go to the dentist regularly every 6 months.  Rx for clindamycin  sent in.  She is gena try and call and get an appointment this week to have the area checked out she has been eating a lot of tortilla chips.    ROS    Objective:     BP 107/64   Pulse 75   Ht 5' 4 (1.626 m)   Wt 129 lb 0.6 oz (58.5 kg)   SpO2 98%   BMI 22.15 kg/m    Physical Exam Vitals and nursing note reviewed.  Constitutional:      Appearance: Normal appearance.  HENT:     Head: Normocephalic and atraumatic.  Eyes:     Conjunctiva/sclera: Conjunctivae normal.  Cardiovascular:     Rate and Rhythm: Normal rate and regular rhythm.  Pulmonary:     Effort: Pulmonary effort is normal.     Breath sounds: Normal breath sounds.  Skin:    General: Skin is warm and dry.  Neurological:     Mental Status: She is alert.  Psychiatric:        Mood and Affect: Mood normal.      No results found for any visits on 08/27/23.    The 10-year ASCVD risk score (Arnett DK, et al., 2019) is: 0.3%    Assessment & Plan:   Problem List Items Addressed This Visit       Other   ADD (attention deficit disorder) - Primary   Well controlled. Continue current regimen. Follow up in  27mo       Relevant Medications   amphetamine -dextroamphetamine  (ADDERALL) 20 MG tablet   Other Visit Diagnoses       Dental abscess         Encounter for immunization       Relevant Orders   Tdap vaccine greater  than or equal to 7yo IM (Completed)      Given Tdap.  Repeat in 10 years.  Dental abscess.  Already started clindamycin  yesterday feels like the swelling is a little bit better today she is gena try to call and get in with her dentist this week.  Return in about 6 months (around 02/24/2024) for ADD.    Dorothyann Byars, MD

## 2023-09-10 ENCOUNTER — Other Ambulatory Visit (HOSPITAL_BASED_OUTPATIENT_CLINIC_OR_DEPARTMENT_OTHER): Payer: Self-pay

## 2023-09-11 IMAGING — CT CT ABD-PELV W/ CM
2 of 5 series · 14 of 46 positions shown, 16 images · IV contrast (APPLIED)
Comparison: CT of the abdomen pelvis with contrast 12/19/2021

CLINICAL DATA: Left lower quadrant abdominal pain. Recent episode
of diverticulitis.

EXAM:
CT ABDOMEN AND PELVIS WITH CONTRAST
TECHNIQUE: Multidetector CT imaging of the abdomen and pelvis was performed
using the standard protocol following bolus administration of
intravenous contrast.
RADIATION DOSE REDUCTION: This exam was performed according to the
departmental dose-optimization program which includes automated
exposure control, adjustment of the mA and/or kV according to
patient size and/or use of iterative reconstruction technique.
CONTRAST:  100 mL Omnipaque 300

[Series 2: axial st · axial · 0.60mm/px · z∈[-447,-62]mm · 11 of 87 slices shown, 13 images]
[im 5/87  soft-tissue]
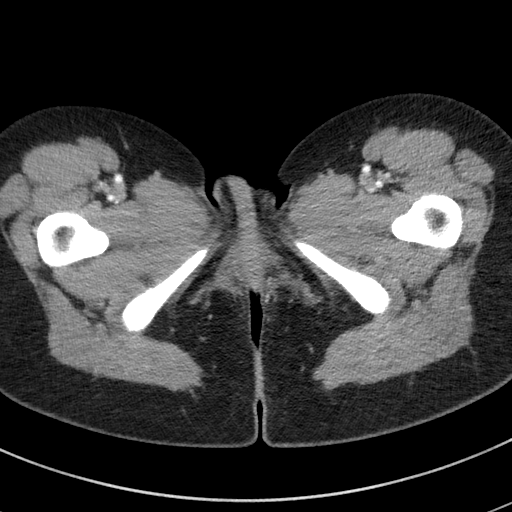
[im 5/87  bone]
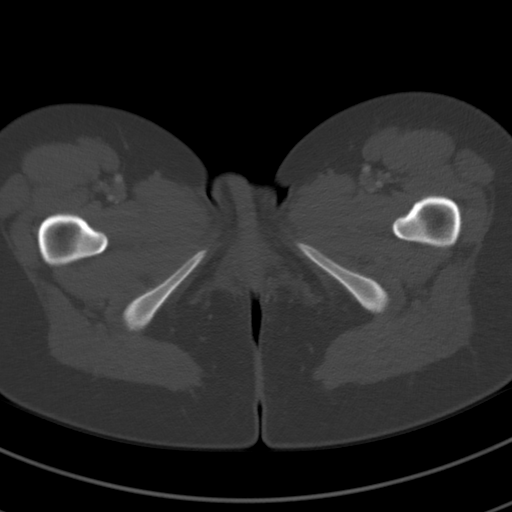
[im 14/87  soft-tissue]
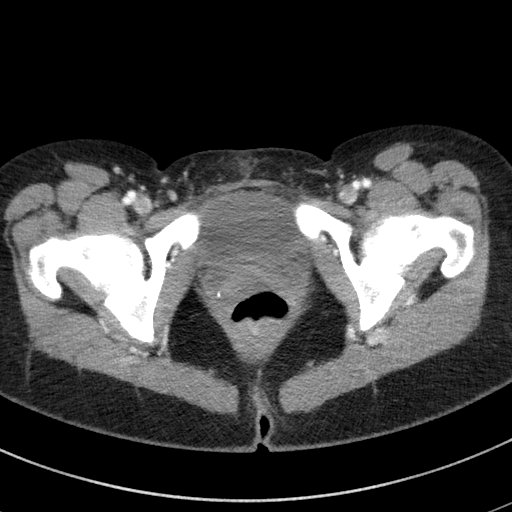
[im 23/87  soft-tissue]
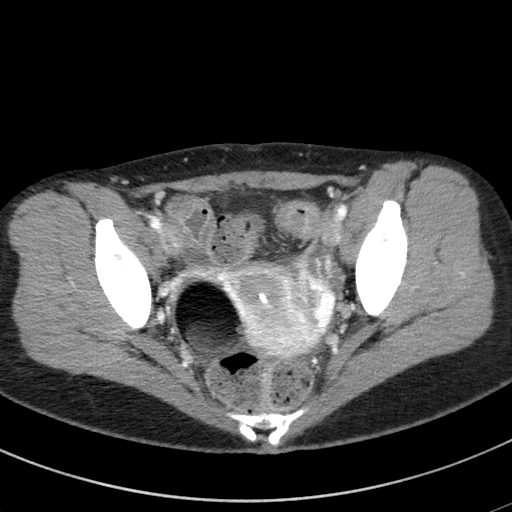
[im 28/87  soft-tissue]
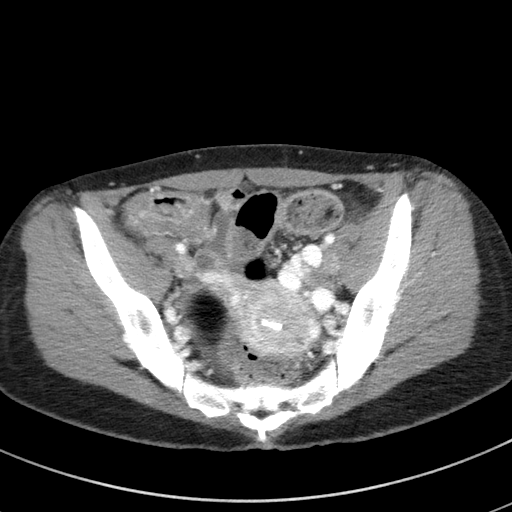
[im 37/87  soft-tissue]
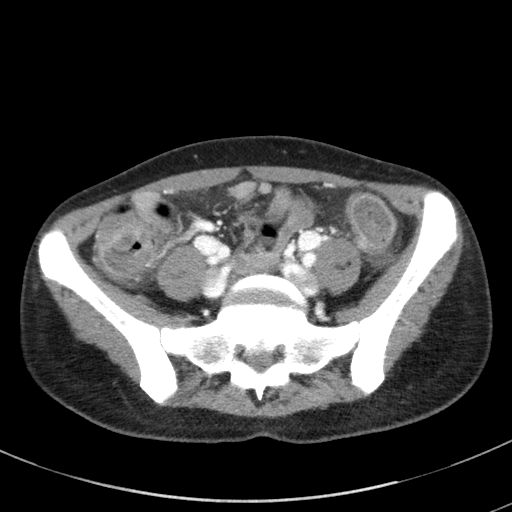
[im 46/87  soft-tissue]
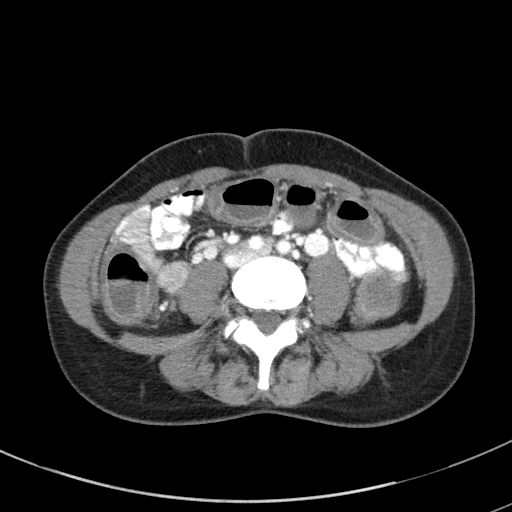
[im 50/87  soft-tissue]
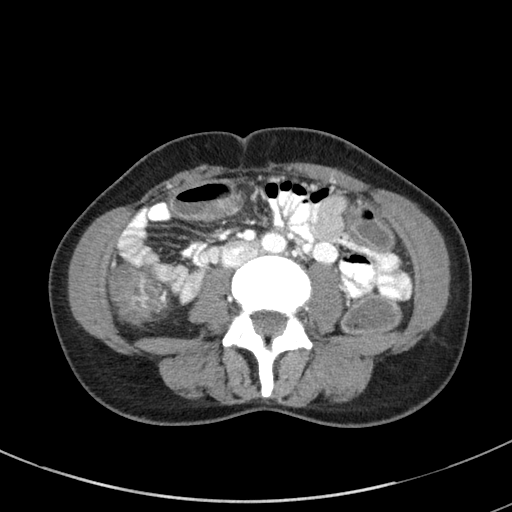
[im 59/87  soft-tissue]
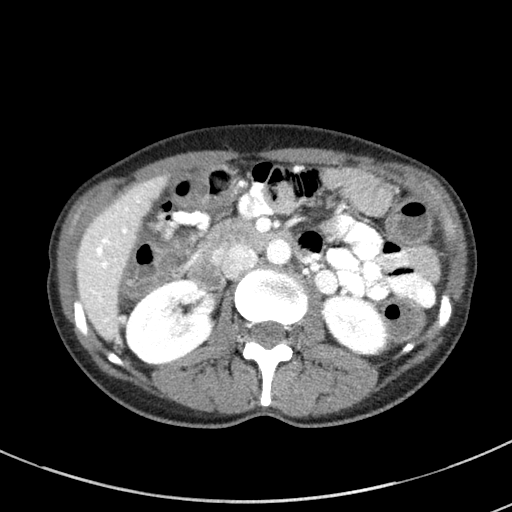
[im 64/87  soft-tissue]
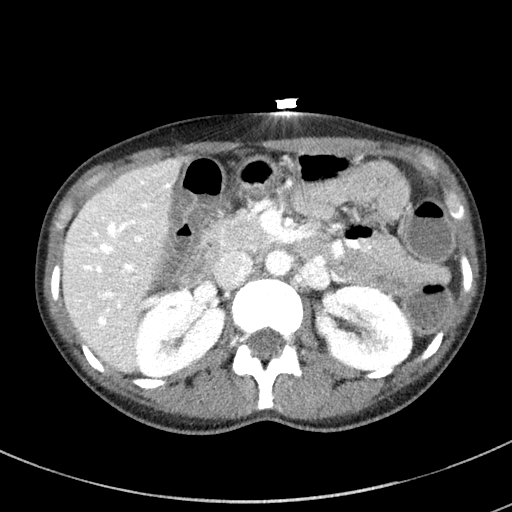
[im 64/87  bone]
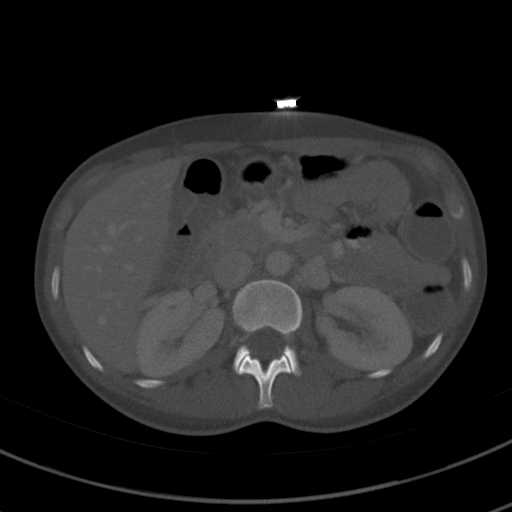
[im 73/87  soft-tissue]
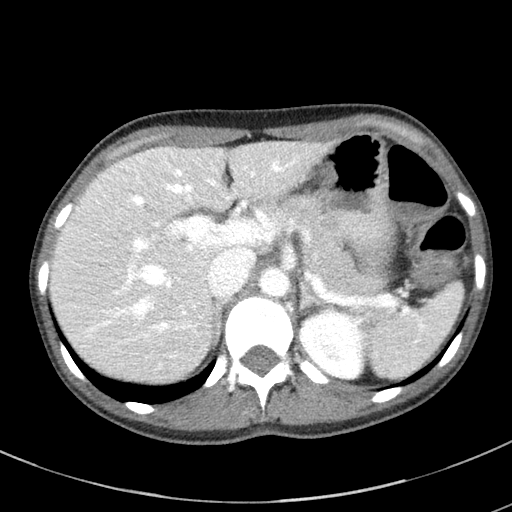
[im 82/87  soft-tissue]
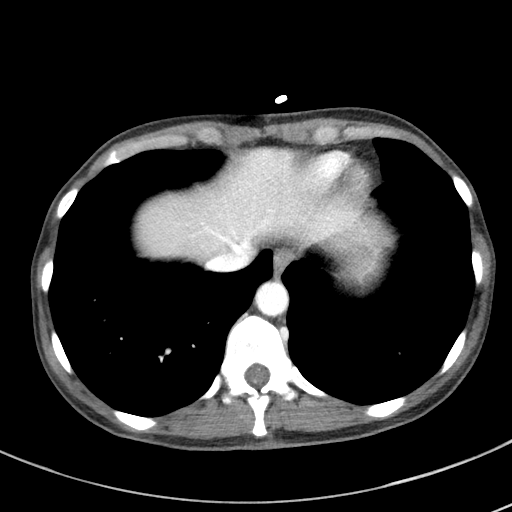

[Series 5: coronal st · coronal · 0.53mm/px · 3 of 65 slices shown]
[im 22/65  soft-tissue]
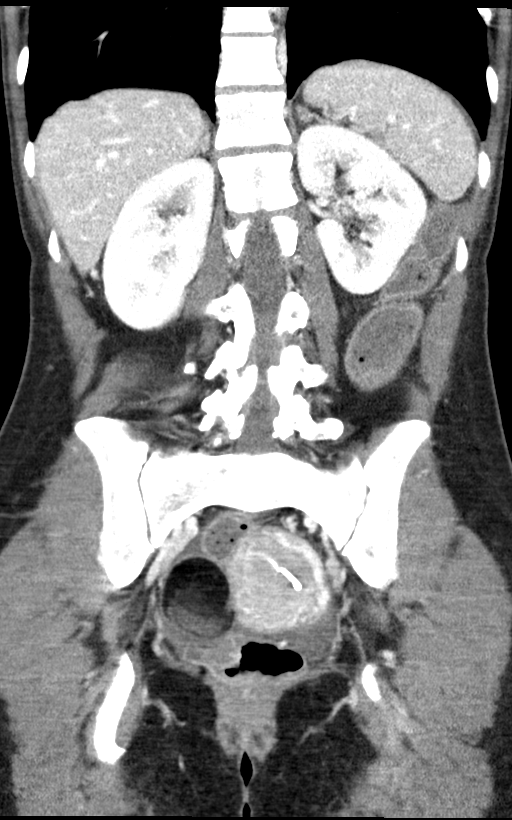
[im 29/65  soft-tissue]
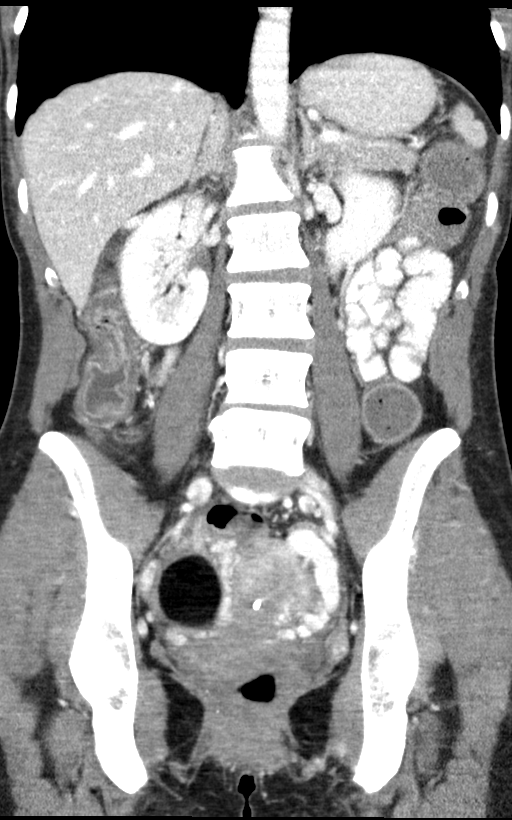
[im 36/65  soft-tissue]
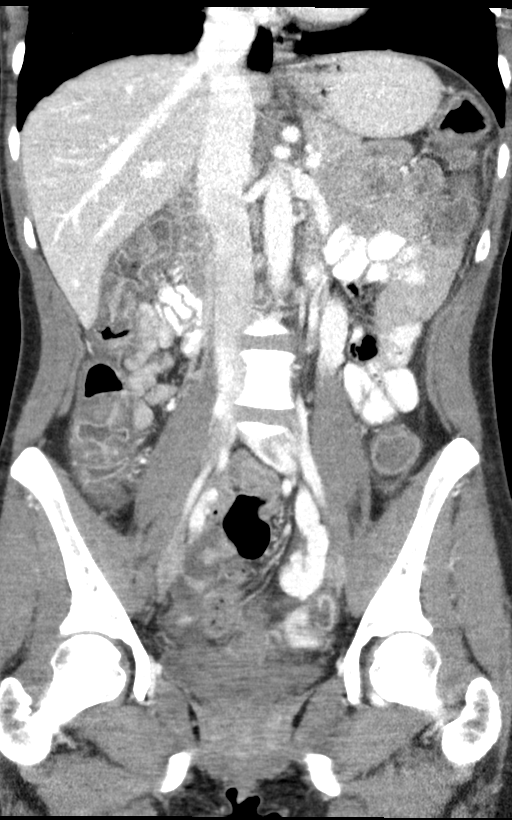

[14 of 46 positions shown; findings below may reference images not displayed]

FINDINGS: Lower chest: The lung bases are clear without focal nodule, mass, or
airspace disease. Heart size is normal. No significant pleural or
pericardial effusion is present.

Hepatobiliary: No focal liver abnormality is seen. Status post
cholecystectomy. No biliary dilatation.

Pancreas: Unremarkable. No pancreatic ductal dilatation or
surrounding inflammatory changes.

Spleen: Normal in size without focal abnormality.

Adrenals/Urinary Tract: Adrenal glands are unremarkable. Kidneys are
normal, without renal calculi, focal lesion, or hydronephrosis.
Bladder is unremarkable.

Stomach/Bowel: The stomach and duodenum are within normal limits.
The small bowel is within normal limits. Diffuse colonic wall
thickening extends from the cecum through the distal sigmoid colon.
Minimal pericolonic inflammatory change remains in the proximal
sigmoid in, the distal aspect of where previous inflammation was
present. No immediately adjacent free fluid or free air is present.
Rectum is within normal limits.

Vascular/Lymphatic: No significant vascular findings are present. No
enlarged abdominal or pelvic lymph nodes.

Reproductive: IUD is stable. Involuting follicle noted in the left
ovary. No follow-up recommended. Uterus and adnexa are otherwise
unremarkable.

Other: Dependent free fluid noted within the anatomic pelvis. No
free air present. No significant ventral hernia.

Musculoskeletal: No acute or significant osseous findings. Bilateral
L5 pars defects are present with minimal grade 1 anterolisthesis,
stable.
IMPRESSION: 1. Diffuse colonic wall thickening extends from the cecum through
the distal sigmoid colon compatible with a nonspecific colitis.
2. Minimal pericolonic inflammatory change remains in the proximal
sigmoid, in the distal aspect of where previous inflammation was
present. No progressive or other focal inflammatory change.
3. Small amount of free fluid dependently within the anatomic pelvis
is likely reactive. No abscess.
4. Bilateral L5 pars defects with minimal grade 1 anterolisthesis,
stable.

## 2023-09-12 ENCOUNTER — Other Ambulatory Visit (HOSPITAL_BASED_OUTPATIENT_CLINIC_OR_DEPARTMENT_OTHER): Payer: Self-pay

## 2024-02-07 ENCOUNTER — Other Ambulatory Visit: Payer: Self-pay

## 2024-02-07 ENCOUNTER — Other Ambulatory Visit (HOSPITAL_BASED_OUTPATIENT_CLINIC_OR_DEPARTMENT_OTHER): Payer: Self-pay

## 2024-02-07 DIAGNOSIS — F988 Other specified behavioral and emotional disorders with onset usually occurring in childhood and adolescence: Secondary | ICD-10-CM

## 2024-02-07 MED ORDER — AMPHETAMINE-DEXTROAMPHETAMINE 20 MG PO TABS
20.0000 mg | ORAL_TABLET | Freq: Two times a day (BID) | ORAL | 0 refills | Status: DC
  Filled 2024-02-07: qty 180, 90d supply, fill #0
  Filled 2024-02-13: qty 150, 75d supply, fill #0
  Filled 2024-02-13: qty 30, 15d supply, fill #0

## 2024-02-07 NOTE — Telephone Encounter (Signed)
 Last fill 08/27/23 last visit 08/27/23 no upcoming visit.

## 2024-02-08 ENCOUNTER — Other Ambulatory Visit: Payer: Self-pay

## 2024-02-13 ENCOUNTER — Other Ambulatory Visit: Payer: Self-pay

## 2024-02-13 ENCOUNTER — Other Ambulatory Visit (HOSPITAL_BASED_OUTPATIENT_CLINIC_OR_DEPARTMENT_OTHER): Payer: Self-pay

## 2024-03-04 ENCOUNTER — Ambulatory Visit (INDEPENDENT_AMBULATORY_CARE_PROVIDER_SITE_OTHER): Admitting: Family Medicine

## 2024-03-04 ENCOUNTER — Encounter: Payer: Self-pay | Admitting: Family Medicine

## 2024-03-04 VITALS — BP 121/77 | HR 98 | Temp 98.0°F | Ht 64.0 in

## 2024-03-04 DIAGNOSIS — R1032 Left lower quadrant pain: Secondary | ICD-10-CM | POA: Diagnosis not present

## 2024-03-04 NOTE — Progress Notes (Signed)
   Acute Office Visit  Subjective:     Patient ID: Lauren Franklin, female    DOB: 12-15-1980, 43 y.o.   MRN: [id number]  Chief Complaint  Patient presents with   Abdominal Pain    HPI Patient is in today for LLQ pain started a couple of days ago.  She noticed that the area on the left side felt tender.  No nausea or vomiting.  He noticed it was a little more uncomfortable when she would try to lift her left leg. No BMs since last thur/Friay.  Yesterday felt cold and ran a low grade temp. Feels little better today.  He has had a prior history of diverticulitis in the large intestine.  Does still have her left ovary and had a uterine ablation so does not have regular periods.  ROS      Objective:    BP 121/77   Pulse 98   Temp 98 F (36.7 C) (Oral)   Ht 5' 4 (1.626 m)   SpO2 98%   BMI 22.15 kg/m    Physical Exam Abdominal:     Tenderness: There is abdominal tenderness in the left lower quadrant. There is no guarding or rebound.     No results found for any visits on 03/04/24.      Assessment & Plan:   Problem List Items Addressed This Visit   None Visit Diagnoses       LLQ pain    -  Primary       Lower quadrant pain-discussed could be early or mild diverticulitis versus constipation versus left ovarian cyst.  Discussed using MiraLAX  to get bowels moving okay to use MiraLAX  up to twice a day.  Switch to a bland liquid diet.  If not improving in the next couple of days then consider pelvic ultrasound for further workup or possible CT if symptoms worsening.  No fever today and she does feel little bit better.  No orders of the defined types were placed in this encounter.   No follow-ups on file.  Dorothyann Byars, MD

## 2024-07-14 ENCOUNTER — Other Ambulatory Visit (HOSPITAL_BASED_OUTPATIENT_CLINIC_OR_DEPARTMENT_OTHER): Payer: Self-pay

## 2024-07-14 ENCOUNTER — Other Ambulatory Visit: Payer: Self-pay

## 2024-07-14 DIAGNOSIS — F988 Other specified behavioral and emotional disorders with onset usually occurring in childhood and adolescence: Secondary | ICD-10-CM

## 2024-07-14 MED ORDER — AMPHETAMINE-DEXTROAMPHETAMINE 20 MG PO TABS
20.0000 mg | ORAL_TABLET | Freq: Two times a day (BID) | ORAL | 0 refills | Status: AC
  Filled 2024-07-14 – 2024-08-08 (×2): qty 180, 90d supply, fill #0

## 2024-07-14 NOTE — Telephone Encounter (Signed)
 Last fill 02/07/24 Last visit 03/04/24 No upcoming visits

## 2024-07-24 ENCOUNTER — Other Ambulatory Visit (HOSPITAL_BASED_OUTPATIENT_CLINIC_OR_DEPARTMENT_OTHER): Payer: Self-pay

## 2024-08-08 ENCOUNTER — Other Ambulatory Visit (HOSPITAL_BASED_OUTPATIENT_CLINIC_OR_DEPARTMENT_OTHER): Payer: Self-pay
